# Patient Record
Sex: Female | Born: 1940 | Race: White | Hispanic: No
Health system: Southern US, Academic
[De-identification: ages and names within clinical notes are randomized; demographics above are authoritative.]

## PROBLEM LIST (undated history)

## (undated) ENCOUNTER — Telehealth

## (undated) ENCOUNTER — Encounter

## (undated) ENCOUNTER — Ambulatory Visit

## (undated) ENCOUNTER — Ambulatory Visit: Attending: Dermatology | Primary: Dermatology

## (undated) ENCOUNTER — Ambulatory Visit: Payer: MEDICARE

## (undated) DIAGNOSIS — R51 Headache: Secondary | ICD-10-CM

## (undated) DIAGNOSIS — L732 Hidradenitis suppurativa: Secondary | ICD-10-CM

## (undated) DIAGNOSIS — D649 Anemia, unspecified: Secondary | ICD-10-CM

## (undated) DIAGNOSIS — I63139 Cerebral infarction due to embolism of unspecified carotid artery: Secondary | ICD-10-CM

## (undated) DIAGNOSIS — I10 Essential (primary) hypertension: Secondary | ICD-10-CM

## (undated) DIAGNOSIS — K219 Gastro-esophageal reflux disease without esophagitis: Secondary | ICD-10-CM

## (undated) DIAGNOSIS — H3530 Unspecified macular degeneration: Secondary | ICD-10-CM

## (undated) DIAGNOSIS — E119 Type 2 diabetes mellitus without complications: Secondary | ICD-10-CM

## (undated) DIAGNOSIS — D219 Benign neoplasm of connective and other soft tissue, unspecified: Secondary | ICD-10-CM

## (undated) DIAGNOSIS — R519 Headache, unspecified: Secondary | ICD-10-CM

## (undated) DIAGNOSIS — I639 Cerebral infarction, unspecified: Secondary | ICD-10-CM

## (undated) DIAGNOSIS — K759 Inflammatory liver disease, unspecified: Secondary | ICD-10-CM

## (undated) DIAGNOSIS — F419 Anxiety disorder, unspecified: Secondary | ICD-10-CM

## (undated) HISTORY — PX: COLONOSCOPY: SHX174

## (undated) HISTORY — DX: Anemia, unspecified: D64.9

## (undated) HISTORY — DX: Cerebral infarction due to embolism of unspecified carotid artery: I63.139

## (undated) HISTORY — PX: BREAST SURGERY: SHX581

## (undated) HISTORY — DX: Cerebral infarction, unspecified: I63.9

## (undated) HISTORY — PX: ABDOMINAL HYSTERECTOMY: SHX81

## (undated) HISTORY — PX: TONSILLECTOMY: SUR1361

## (undated) HISTORY — DX: Gastro-esophageal reflux disease without esophagitis: K21.9

## (undated) HISTORY — PX: CARPAL TUNNEL RELEASE: SHX101

## (undated) HISTORY — DX: Anxiety disorder, unspecified: F41.9

## (undated) HISTORY — DX: Essential (primary) hypertension: I10

---

## 2011-08-23 ENCOUNTER — Other Ambulatory Visit: Payer: Self-pay | Admitting: Family Medicine

## 2011-08-23 DIAGNOSIS — Z1231 Encounter for screening mammogram for malignant neoplasm of breast: Secondary | ICD-10-CM

## 2011-09-07 ENCOUNTER — Ambulatory Visit: Payer: Self-pay

## 2011-09-08 ENCOUNTER — Ambulatory Visit
Admission: RE | Admit: 2011-09-08 | Discharge: 2011-09-08 | Disposition: A | Discharge status: Home / Self Care | Payer: Medicare Other | Source: Ambulatory Visit | Attending: Family Medicine | Admitting: Family Medicine

## 2011-09-08 DIAGNOSIS — Z1231 Encounter for screening mammogram for malignant neoplasm of breast: Secondary | ICD-10-CM

## 2013-03-29 ENCOUNTER — Encounter: Payer: Self-pay | Admitting: Infectious Diseases

## 2013-03-29 ENCOUNTER — Ambulatory Visit (INDEPENDENT_AMBULATORY_CARE_PROVIDER_SITE_OTHER): Payer: Medicare Other | Admitting: Infectious Diseases

## 2013-03-29 VITALS — BP 186/77 | HR 99 | Temp 98.3°F | Ht 63.5 in | Wt 154.0 lb

## 2013-03-29 DIAGNOSIS — L732 Hidradenitis suppurativa: Secondary | ICD-10-CM | POA: Insufficient documentation

## 2013-03-29 MED ORDER — CLINDAMYCIN PHOSPHATE 1 % EX GEL: Freq: Four times a day (QID) | CUTANEOUS | Status: DC

## 2013-03-29 NOTE — Addendum Note (Signed)
Addended by: Landis Gandy on: 03/29/2013 04:32 PM   Modules accepted: Orders

## 2013-03-29 NOTE — Addendum Note (Signed)
Addended by: Dolan Amen D on: 03/29/2013 04:35 PM   Modules accepted: Orders

## 2013-03-29 NOTE — Progress Notes (Signed)
   Subjective:    Patient ID: Melinda Eaton, female    DOB: Jun 23, 1940, 73 y.o.   MRN: [id number]  HPI 73 yo F with hx of GERD, elevated GLCs, she was seen by her PCP 10-25-12 for boils on her thighs. She had a previous episode in June 2014 (tx with doxy). Clear fluid was noted to come out of them.  She was treated in Sept with bactrim for 2 weeks and topical bactroban.  She returned to her PCP Jan 2015 and felt that she had new lesions. States that the "sulfa was doing something to my liver so they had to stop that".  She was given a course of keflex, which she is still taking. Has been using bactroban topically on the boils as well.  No f/c. No surrounding erythema but has had bleeding and tan d/c with lesions.  Has been exercising at a gym. Uses soft soap, antibacterial as well as dial.   Wt down 27#. occas headaches, "reads a lot" Review of Systems  Constitutional: Positive for unexpected weight change. Negative for fever, chills and appetite change.  Respiratory: Negative for shortness of breath.   Cardiovascular: Negative for chest pain.  Gastrointestinal: Negative for diarrhea and constipation.  Genitourinary: Negative for difficulty urinating.  Neurological: Positive for headaches.       Objective:   Physical Exam  Constitutional: She appears well-developed and well-nourished.  HENT:  Mouth/Throat: No oropharyngeal exudate.  Eyes: EOM are normal. Pupils are equal, round, and reactive to light.  Neck: Neck supple.  Cardiovascular: Normal rate, regular rhythm and normal heart sounds.   Pulmonary/Chest: Effort normal and breath sounds normal.  Abdominal: Soft. Bowel sounds are normal. She exhibits no distension. There is no tenderness.  Lymphadenopathy:    She has no cervical adenopathy.  Skin:             Assessment & Plan:

## 2013-03-29 NOTE — Assessment & Plan Note (Signed)
I sent her d/c from her groin wound for Cx. Her lesions seem more consistent with hidradenitis suppurativa. Will write her for clinda gel, she can continue her keflex for now. I asked her to consider being eval by surgery as this can be very helpful for these lesions. She is very reluctant to do this.  Will see her back in 2 weeks.

## 2013-04-01 LAB — WOUND CULTURE
GRAM STAIN: NONE SEEN
Gram Stain: NONE SEEN
Gram Stain: NONE SEEN
ORGANISM ID, BACTERIA: NO GROWTH

## 2013-04-11 ENCOUNTER — Ambulatory Visit (INDEPENDENT_AMBULATORY_CARE_PROVIDER_SITE_OTHER): Payer: Medicare Other | Admitting: Infectious Diseases

## 2013-04-11 ENCOUNTER — Encounter: Payer: Self-pay | Admitting: Infectious Diseases

## 2013-04-11 VITALS — BP 169/82 | HR 96 | Temp 98.0°F | Ht 63.0 in | Wt 157.0 lb

## 2013-04-11 DIAGNOSIS — L732 Hidradenitis suppurativa: Secondary | ICD-10-CM

## 2013-04-11 MED ORDER — DOXYCYCLINE HYCLATE 50 MG PO CAPS
50.0000 mg | ORAL_CAPSULE | Freq: Two times a day (BID) | ORAL | Status: DC
Start: 2013-04-11 — End: 2013-05-21

## 2013-04-11 NOTE — Assessment & Plan Note (Signed)
Will continue her clinda-gel. Will change her anbx to doxy. I pleaded with her to see surgery. She agrees. Will see her back in 1 month. A repeat wound Cx is sent.

## 2013-04-11 NOTE — Progress Notes (Signed)
73 yo F with hx of GERD, elevated GLCs, she was seen by her PCP 10-25-12 for boils on her thighs. She had a previous episode in June 2014 (tx with doxy). Clear fluid was noted to come out of them.  She was treated in Sept with bactrim for 2 weeks and topical bactroban.  She returned to her PCP Jan 2015 and felt that she had new lesions. States that the "sulfa was doing something to my liver so they had to stop that". She was given a course of keflex, which she is still taking. Has been using bactroban topically on the boils as well.  She was seen in ID clinic 2-5 and was felt to have hidradenitis suppuritiva- she was started on clindagel, continued on kelfex. She had wound Cx sent which was (-). She was offered surgical eval which she deffered.  Today states her wounds are unchanged, are draining, and she feels that she has more sores now. Has 2 more days of anbx. Has been using clinda-gel.  BP was fine at home, no headaches, no chest pain, no fevers, no chills.   BP 169/82  Pulse 96  Temp(Src) 98 F (36.7 C) (Oral)  Ht 5\' 3"  (1.6 m)  Wt 157 lb (71.215 kg)  BMI 27.82 kg/m2 NAD Groin wounds show mild d/c, mild tenderness. There is purulent d/c from L groin wound.

## 2013-04-14 LAB — WOUND CULTURE
GRAM STAIN: NONE SEEN
Gram Stain: NONE SEEN
Organism ID, Bacteria: NO GROWTH

## 2013-04-17 ENCOUNTER — Ambulatory Visit (INDEPENDENT_AMBULATORY_CARE_PROVIDER_SITE_OTHER): Payer: Medicare Other | Admitting: Surgery

## 2013-04-17 ENCOUNTER — Encounter (INDEPENDENT_AMBULATORY_CARE_PROVIDER_SITE_OTHER): Payer: Self-pay | Admitting: Surgery

## 2013-04-17 VITALS — BP 155/103 | HR 84 | Temp 98.6°F | Resp 18 | Ht 63.5 in | Wt 159.0 lb

## 2013-04-17 DIAGNOSIS — L732 Hidradenitis suppurativa: Secondary | ICD-10-CM

## 2013-04-17 NOTE — Progress Notes (Signed)
Patient ID: Melinda Eaton, female   DOB: Nov 16, 1940, 73 y.o.   MRN: [id number]  Chief Complaint  Patient presents with  . Other    hidradenitis    HPI Melinda Eaton is a 73 y.o. female.   HPI She is referred by Dr. Bobby Rumpf for evaluation of chronic hidradenitis in both groins. She reports this started approximately a year or so ago. She has had intermittent draining boils in both her groins. The left side as been worse in the right. She has no other previous history of hidradenitis in the distant past. She has no other areas draining. Past Medical History  Diagnosis Date  . GERD (gastroesophageal reflux disease)   . Hypertension     History reviewed. No pertinent past surgical history.  Family History  Problem Relation Age of Onset  . Cancer Father     prostate  . Cancer Sister     Social History History  Substance Use Topics  . Smoking status: Current Every Day Smoker -- 0.50 packs/day    Types: Cigarettes  . Smokeless tobacco: Never Used  . Alcohol Use: No    Allergies  Allergen Reactions  . Bactrim [Sulfamethoxazole-Tmp Ds] Other (See Comments)    hepatitis    Current Outpatient Prescriptions  Medication Sig Dispense Refill  . amLODipine (NORVASC) 5 MG tablet Take 5 mg by mouth daily.      . clindamycin (CLINDAGEL) 1 % gel Apply topically 4 (four) times daily.  30 g  1  . doxycycline (VIBRAMYCIN) 50 MG capsule Take 1 capsule (50 mg total) by mouth 2 (two) times daily.  60 capsule  1  . hydrochlorothiazide (MICROZIDE) 12.5 MG capsule Take 12.5 mg by mouth daily.      . mupirocin ointment (BACTROBAN) 2 % Apply 1 application topically 2 (two) times daily. 2-3 times daily on boils      . omeprazole (PRILOSEC) 20 MG capsule Take 20 mg by mouth daily.      . quinapril (ACCUPRIL) 20 MG tablet Take 20 mg by mouth at bedtime.       No current facility-administered medications for this visit.    Review of Systems Review of Systems  Constitutional: Negative  for fever, chills and unexpected weight change.  HENT: Negative for congestion, hearing loss, sore throat, trouble swallowing and voice change.   Eyes: Negative for visual disturbance.  Respiratory: Negative for cough and wheezing.   Cardiovascular: Negative for chest pain, palpitations and leg swelling.  Gastrointestinal: Negative for nausea, vomiting, abdominal pain, diarrhea, constipation, blood in stool, abdominal distention and anal bleeding.  Genitourinary: Negative for hematuria, vaginal bleeding and difficulty urinating.  Musculoskeletal: Negative for arthralgias.  Skin: Negative for rash and wound.  Neurological: Negative for seizures, syncope and headaches.  Hematological: Negative for adenopathy. Does not bruise/bleed easily.  Psychiatric/Behavioral: Negative for confusion.    Blood pressure 155/103, pulse 84, temperature 98.6 F (37 C), temperature source Temporal, resp. rate 18, height 5' 3.5" (1.613 m), weight 159 lb (72.122 kg).  Physical Exam Physical Exam  Constitutional: She is oriented to person, place, and time. She appears well-developed and well-nourished. No distress.  HENT:  Head: Normocephalic and atraumatic.  Right Ear: External ear normal.  Left Ear: External ear normal.  Nose: Nose normal.  Mouth/Throat: Oropharynx is clear and moist. No oropharyngeal exudate.  Eyes: Conjunctivae are normal. Pupils are equal, round, and reactive to light. Right eye exhibits no discharge. Left eye exhibits no discharge. No scleral icterus.  Neck: Normal  range of motion. Neck supple. No tracheal deviation present.  Cardiovascular: Normal rate, regular rhythm, normal heart sounds and intact distal pulses.   No murmur heard. Pulmonary/Chest: Effort normal and breath sounds normal. No respiratory distress. She has no wheezes. She has no rales.  Abdominal: Soft. Bowel sounds are normal. There is no tenderness. There is no guarding.  Musculoskeletal: Normal range of motion. She  exhibits no edema and no tenderness.  Lymphadenopathy:    She has no cervical adenopathy.  Neurological: She is alert and oriented to person, place, and time.  Skin: Skin is warm. She is not diaphoretic.  She has chronic draining sinus tracts in both her groins consistent with chronic hidradenitis  Psychiatric: Her behavior is normal. Judgment normal.    Data Reviewed    Assessment    Chronic hidradenitis     Plan    I discussed the diagnosis with her in detail. We discussed continued expectant management first wide excision in the groins. I believe I could widely excise these areas and get the skin back together without need for any kind of skin grafting. She again understands this is not a curative procedure but will control her chronic draining sinus tracts. I discussed the risk which includes but is not limited to bleeding, infection, wound breakdown, etc. She understands and wished to proceed with wide excision in both groins. Surgery will be scheduled        Kewon Statler A 04/17/2013, 2:40 PM

## 2013-04-26 ENCOUNTER — Encounter (HOSPITAL_COMMUNITY): Payer: Self-pay | Admitting: Pharmacy Technician

## 2013-04-27 ENCOUNTER — Encounter (HOSPITAL_COMMUNITY)
Admission: RE | Admit: 2013-04-27 | Discharge: 2013-04-27 | Disposition: A | Discharge status: Home / Self Care | Payer: Medicare Other | Source: Ambulatory Visit | Attending: Anesthesiology | Admitting: Anesthesiology

## 2013-04-27 ENCOUNTER — Encounter (HOSPITAL_COMMUNITY)
Admission: RE | Admit: 2013-04-27 | Discharge: 2013-04-27 | Disposition: A | Discharge status: Home / Self Care | Payer: Medicare Other | Source: Ambulatory Visit | Attending: Surgery | Admitting: Surgery

## 2013-04-27 ENCOUNTER — Encounter (HOSPITAL_COMMUNITY): Payer: Self-pay

## 2013-04-27 ENCOUNTER — Other Ambulatory Visit (HOSPITAL_COMMUNITY): Payer: Self-pay | Admitting: *Deleted

## 2013-04-27 DIAGNOSIS — Z01812 Encounter for preprocedural laboratory examination: Secondary | ICD-10-CM | POA: Insufficient documentation

## 2013-04-27 DIAGNOSIS — Z01810 Encounter for preprocedural cardiovascular examination: Secondary | ICD-10-CM | POA: Insufficient documentation

## 2013-04-27 DIAGNOSIS — Z01818 Encounter for other preprocedural examination: Secondary | ICD-10-CM | POA: Insufficient documentation

## 2013-04-27 HISTORY — DX: Unspecified macular degeneration: H35.30

## 2013-04-27 LAB — BASIC METABOLIC PANEL
BUN: 18 mg/dL (ref 6–23)
CALCIUM: 9.2 mg/dL (ref 8.4–10.5)
CO2: 28 meq/L (ref 19–32)
Chloride: 98 mEq/L (ref 96–112)
Creatinine, Ser: 0.66 mg/dL (ref 0.50–1.10)
GFR calc Af Amer: >90 mL/min (ref >90)
GFR, EST NON AFRICAN AMERICAN: 86 mL/min — AB (ref >90)
Glucose, Bld: 111 mg/dL — ABNORMAL HIGH (ref 70–99)
POTASSIUM: 3.6 meq/L — AB (ref 3.7–5.3)
SODIUM: 139 meq/L (ref 137–147)

## 2013-04-27 LAB — CBC
HCT: 40.3 % (ref 36.0–46.0)
HEMOGLOBIN: 13.9 g/dL (ref 12.0–15.0)
MCH: 30.2 pg (ref 26.0–34.0)
MCHC: 34.5 g/dL (ref 30.0–36.0)
MCV: 87.4 fL (ref 78.0–100.0)
Platelets: 191 10*3/uL (ref 150–400)
RBC: 4.61 MIL/uL (ref 3.87–5.11)
RDW: 13.2 % (ref 11.5–15.5)
WBC: 7.9 10*3/uL (ref 4.0–10.5)

## 2013-04-27 NOTE — Pre-Procedure Instructions (Signed)
Melinda Eaton  04/27/2013   Your procedure is scheduled on:  Monday, May 07, 2013 at 11:15 AM.   Report to [facility] Entrance "A" Admitting Office at 8:15 AM.   Call this number if you have problems the morning of surgery: [phone]   Remember:   Do not eat food or drink liquids after midnight Sunday, 05/06/13.   Take these medicines the morning of surgery with A SIP OF WATER:amLODipine (NORVASC), omeprazole (PRILOSEC)  Stop Vitamins 7 days prior to surgery.    Do not wear jewelry, make-up or nail polish.  Do not wear lotions, powders, or perfumes. You may wear deodorant.  Do not shave 48 hours prior to surgery.   Do not bring valuables to the hospital.  [facility] is not responsible                  for any belongings or valuables.               Contacts, dentures or bridgework may not be worn into surgery.  Leave suitcase in the car. After surgery it may be brought to your room.  For patients admitted to the hospital, discharge time is determined by your                treatment team.               Patients discharged the day of surgery will not be allowed to drive home.  Name and phone number of your driver: Family/friend   Special Instructions: [facility] - Preparing for Surgery  Before surgery, you can play an important role.  Because skin is not sterile, your skin needs to be as free of germs as possible.  You can reduce the number of germs on you skin by washing with CHG (chlorahexidine gluconate) soap before surgery.  CHG is an antiseptic cleaner which kills germs and bonds with the skin to continue killing germs even after washing.  Please DO NOT use if you have an allergy to CHG or antibacterial soaps.  If your skin becomes reddened/irritated stop using the CHG and inform your nurse when you arrive at Short Stay.  Do not shave (including legs and underarms) for at least 48 hours prior to the first CHG shower.  You may shave your face.  Please follow  these instructions carefully:   1.  Shower with CHG Soap the night before surgery and the                                morning of Surgery.  2.  If you choose to wash your hair, wash your hair first as usual with your       normal shampoo.  3.  After you shampoo, rinse your hair and body thoroughly to remove the                      Shampoo.  4.  Use CHG as you would any other liquid soap.  You can apply chg directly       to the skin and wash gently with scrungie or a clean washcloth.  5.  Apply the CHG Soap to your body ONLY FROM THE NECK DOWN.        Do not use on open wounds or open sores.  Avoid contact with your eyes, ears, mouth and genitals (private parts).  Wash genitals (private  parts) with your normal soap.  6.  Wash thoroughly, paying special attention to the area where your surgery        will be performed.  7.  Thoroughly rinse your body with warm water from the neck down.  8.  DO NOT shower/wash with your normal soap after using and rinsing off       the CHG Soap.  9.  Pat yourself dry with a clean towel.            10.  Wear clean pajamas.            11.  Place clean sheets on your bed the night of your first shower and do not        sleep with pets.  Day of Surgery  Do not apply any lotions the morning of surgery.  Please wear clean clothes to the hospital/surgery center.     Please read over the following fact sheets that you were given: Pain Booklet, Coughing and Deep Breathing and Surgical Site Infection Prevention

## 2013-05-02 ENCOUNTER — Telehealth: Payer: Self-pay | Admitting: *Deleted

## 2013-05-02 NOTE — Telephone Encounter (Signed)
Request for notes from [facility], referring provider. Office visit notes faxed to [phone]. Myrtis Hopping

## 2013-05-06 MED ORDER — CEFAZOLIN SODIUM-DEXTROSE 2-3 GM-% IV SOLR
2.0000 g | INTRAVENOUS | Status: AC
  Administered 2013-05-07: 2 g via INTRAVENOUS
  Filled 2013-05-06: qty 50

## 2013-05-06 NOTE — H&P (Signed)
Chief Complaint   Patient presents with   .  Other     hidradenitis   HPI  Melinda Eaton is a 73 y.o. female.  HPI  She is referred by Dr. Bobby Rumpf for evaluation of chronic hidradenitis in both groins. She reports this started approximately a year or so ago. She has had intermittent draining boils in both her groins. The left side as been worse in the right. She has no other previous history of hidradenitis in the distant past. She has no other areas draining.  Past Medical History   Diagnosis  Date   .  GERD (gastroesophageal reflux disease)    .  Hypertension    History reviewed. No pertinent past surgical history.  Family History   Problem  Relation  Age of Onset   .  Cancer  Father      prostate   .  Cancer  Sister    Social History  History   Substance Use Topics   .  Smoking status:  Current Every Day Smoker -- 0.50 packs/day     Types:  Cigarettes   .  Smokeless tobacco:  Never Used   .  Alcohol Use:  No    Allergies   Allergen  Reactions   .  Bactrim [Sulfamethoxazole-Tmp Ds]  Other (See Comments)     hepatitis    Current Outpatient Prescriptions   Medication  Sig  Dispense  Refill   .  amLODipine (NORVASC) 5 MG tablet  Take 5 mg by mouth daily.     .  clindamycin (CLINDAGEL) 1 % gel  Apply topically 4 (four) times daily.  30 g  1   .  doxycycline (VIBRAMYCIN) 50 MG capsule  Take 1 capsule (50 mg total) by mouth 2 (two) times daily.  60 capsule  1   .  hydrochlorothiazide (MICROZIDE) 12.5 MG capsule  Take 12.5 mg by mouth daily.     .  mupirocin ointment (BACTROBAN) 2 %  Apply 1 application topically 2 (two) times daily. 2-3 times daily on boils     .  omeprazole (PRILOSEC) 20 MG capsule  Take 20 mg by mouth daily.     .  quinapril (ACCUPRIL) 20 MG tablet  Take 20 mg by mouth at bedtime.      No current facility-administered medications for this visit.   Review of Systems  Review of Systems  Constitutional: Negative for fever, chills and unexpected  weight change.  HENT: Negative for congestion, hearing loss, sore throat, trouble swallowing and voice change.  Eyes: Negative for visual disturbance.  Respiratory: Negative for cough and wheezing.  Cardiovascular: Negative for chest pain, palpitations and leg swelling.  Gastrointestinal: Negative for nausea, vomiting, abdominal pain, diarrhea, constipation, blood in stool, abdominal distention and anal bleeding.  Genitourinary: Negative for hematuria, vaginal bleeding and difficulty urinating.  Musculoskeletal: Negative for arthralgias.  Skin: Negative for rash and wound.  Neurological: Negative for seizures, syncope and headaches.  Hematological: Negative for adenopathy. Does not bruise/bleed easily.  Psychiatric/Behavioral: Negative for confusion.  Blood pressure 155/103, pulse 84, temperature 98.6 F (37 C), temperature source Temporal, resp. rate 18, height 5' 3.5" (1.613 m), weight 159 lb (72.122 kg).  Physical Exam  Physical Exam  Constitutional: She is oriented to person, place, and time. She appears well-developed and well-nourished. No distress.  HENT:  Head: Normocephalic and atraumatic.  Right Ear: External ear normal.  Left Ear: External ear normal.  Nose: Nose normal.  Mouth/Throat: Oropharynx is  clear and moist. No oropharyngeal exudate.  Eyes: Conjunctivae are normal. Pupils are equal, round, and reactive to light. Right eye exhibits no discharge. Left eye exhibits no discharge. No scleral icterus.  Neck: Normal range of motion. Neck supple. No tracheal deviation present.  Cardiovascular: Normal rate, regular rhythm, normal heart sounds and intact distal pulses.  No murmur heard.  Pulmonary/Chest: Effort normal and breath sounds normal. No respiratory distress. She has no wheezes. She has no rales.  Abdominal: Soft. Bowel sounds are normal. There is no tenderness. There is no guarding.  Musculoskeletal: Normal range of motion. She exhibits no edema and no tenderness.   Lymphadenopathy:  She has no cervical adenopathy.  Neurological: She is alert and oriented to person, place, and time.  Skin: Skin is warm. She is not diaphoretic.  She has chronic draining sinus tracts in both her groins consistent with chronic hidradenitis  Psychiatric: Her behavior is normal. Judgment normal.  Data Reviewed  Assessment  Chronic hidradenitis  Plan  I discussed the diagnosis with her in detail. We discussed continued expectant management first wide excision in the groins. I believe I could widely excise these areas and get the skin back together without need for any kind of skin grafting. She again understands this is not a curative procedure but will control her chronic draining sinus tracts. I discussed the risk which includes but is not limited to bleeding, infection, wound breakdown, etc. She understands and wished to proceed with wide excision in both groins. Surgery will be scheduled

## 2013-05-07 ENCOUNTER — Encounter (HOSPITAL_COMMUNITY)
Admission: RE | Disposition: A | Discharge status: Home / Self Care | Payer: Self-pay | Source: Ambulatory Visit | Attending: Surgery

## 2013-05-07 ENCOUNTER — Encounter (HOSPITAL_COMMUNITY): Payer: Self-pay | Admitting: Anesthesiology

## 2013-05-07 ENCOUNTER — Ambulatory Visit (HOSPITAL_COMMUNITY)
Admission: RE | Admit: 2013-05-07 | Discharge: 2013-05-07 | Disposition: A | Discharge status: Home / Self Care | Payer: Medicare Other | Source: Ambulatory Visit | Attending: Surgery | Admitting: Surgery

## 2013-05-07 ENCOUNTER — Encounter (HOSPITAL_COMMUNITY): Payer: Medicare Other | Admitting: Certified Registered"

## 2013-05-07 ENCOUNTER — Ambulatory Visit (HOSPITAL_COMMUNITY): Payer: Medicare Other | Admitting: Certified Registered"

## 2013-05-07 DIAGNOSIS — I10 Essential (primary) hypertension: Secondary | ICD-10-CM | POA: Insufficient documentation

## 2013-05-07 DIAGNOSIS — F17200 Nicotine dependence, unspecified, uncomplicated: Secondary | ICD-10-CM | POA: Insufficient documentation

## 2013-05-07 DIAGNOSIS — L732 Hidradenitis suppurativa: Secondary | ICD-10-CM

## 2013-05-07 DIAGNOSIS — K219 Gastro-esophageal reflux disease without esophagitis: Secondary | ICD-10-CM | POA: Insufficient documentation

## 2013-05-07 HISTORY — PX: HYDRADENITIS EXCISION: SHX5243

## 2013-05-07 SURGERY — EXCISION, HIDRADENITIS, INGUINAL REGION
Anesthesia: General | Site: Groin | Laterality: Bilateral

## 2013-05-07 MED ORDER — ONDANSETRON HCL 4 MG/2ML IJ SOLN
INTRAMUSCULAR | Status: DC | PRN
  Administered 2013-05-07: 4 mg via INTRAVENOUS

## 2013-05-07 MED ORDER — PROPOFOL 10 MG/ML IV BOLUS
INTRAVENOUS | Status: DC | PRN
  Administered 2013-05-07: 120 mg via INTRAVENOUS

## 2013-05-07 MED ORDER — KETOROLAC TROMETHAMINE 15 MG/ML IJ SOLN
INTRAMUSCULAR | Status: DC | PRN
  Administered 2013-05-07: 15 mg via INTRAVENOUS

## 2013-05-07 MED ORDER — 0.9 % SODIUM CHLORIDE (POUR BTL) OPTIME
TOPICAL | Status: DC | PRN
  Administered 2013-05-07: 1000 mL

## 2013-05-07 MED ORDER — CLINDAMYCIN HCL 150 MG PO CAPS: 150.0000 mg | ORAL_CAPSULE | Freq: Three times a day (TID) | ORAL | Status: DC

## 2013-05-07 MED ORDER — MIDAZOLAM HCL 5 MG/5ML IJ SOLN
INTRAMUSCULAR | Status: DC | PRN
Start: 2013-05-07 — End: 2013-05-07
  Administered 2013-05-07: 1 mg via INTRAVENOUS

## 2013-05-07 MED ORDER — BUPIVACAINE-EPINEPHRINE (PF) 0.5% -1:200000 IJ SOLN
INTRAMUSCULAR | Status: AC
  Filled 2013-05-07: qty 10

## 2013-05-07 MED ORDER — KETOROLAC TROMETHAMINE 30 MG/ML IJ SOLN
INTRAMUSCULAR | Status: AC
  Filled 2013-05-07: qty 1

## 2013-05-07 MED ORDER — LIDOCAINE HCL (CARDIAC) 20 MG/ML IV SOLN
INTRAVENOUS | Status: AC
  Filled 2013-05-07: qty 5

## 2013-05-07 MED ORDER — ONDANSETRON HCL 4 MG/2ML IJ SOLN: 4.0000 mg | Freq: Once | INTRAMUSCULAR | Status: DC | PRN

## 2013-05-07 MED ORDER — FENTANYL CITRATE 0.05 MG/ML IJ SOLN
INTRAMUSCULAR | Status: DC | PRN
  Administered 2013-05-07 (×4): 50 ug via INTRAVENOUS

## 2013-05-07 MED ORDER — ONDANSETRON HCL 4 MG/2ML IJ SOLN
INTRAMUSCULAR | Status: AC
  Filled 2013-05-07: qty 2

## 2013-05-07 MED ORDER — MIDAZOLAM HCL 2 MG/2ML IJ SOLN
INTRAMUSCULAR | Status: AC
  Filled 2013-05-07: qty 2

## 2013-05-07 MED ORDER — PROPOFOL 10 MG/ML IV BOLUS
INTRAVENOUS | Status: AC
  Filled 2013-05-07: qty 20

## 2013-05-07 MED ORDER — LIDOCAINE HCL (CARDIAC) 20 MG/ML IV SOLN
INTRAVENOUS | Status: DC | PRN
  Administered 2013-05-07: 60 mg via INTRAVENOUS

## 2013-05-07 MED ORDER — HYDROCODONE-ACETAMINOPHEN 5-325 MG PO TABS: 1.0000 | ORAL_TABLET | Freq: Four times a day (QID) | ORAL | Status: DC | PRN

## 2013-05-07 MED ORDER — PHENYLEPHRINE 40 MCG/ML (10ML) SYRINGE FOR IV PUSH (FOR BLOOD PRESSURE SUPPORT)
PREFILLED_SYRINGE | INTRAVENOUS | Status: AC
  Filled 2013-05-07: qty 10

## 2013-05-07 MED ORDER — HYDROMORPHONE HCL PF 1 MG/ML IJ SOLN: 0.2500 mg | INTRAMUSCULAR | Status: DC | PRN

## 2013-05-07 MED ORDER — FENTANYL CITRATE 0.05 MG/ML IJ SOLN
INTRAMUSCULAR | Status: AC
  Filled 2013-05-07: qty 5

## 2013-05-07 MED ORDER — LACTATED RINGERS IV SOLN
INTRAVENOUS | Status: DC
  Administered 2013-05-07 (×3): via INTRAVENOUS

## 2013-05-07 MED ORDER — BUPIVACAINE-EPINEPHRINE 0.5% -1:200000 IJ SOLN
INTRAMUSCULAR | Status: DC | PRN
  Administered 2013-05-07: 30 mL

## 2013-05-07 MED ORDER — PHENYLEPHRINE HCL 10 MG/ML IJ SOLN
INTRAMUSCULAR | Status: DC | PRN
  Administered 2013-05-07: 80 ug via INTRAVENOUS

## 2013-05-07 SURGICAL SUPPLY — 54 items
BENZOIN TINCTURE PRP APPL 2/3 (GAUZE/BANDAGES/DRESSINGS) ×3 IMPLANT
BLADE SURG 10 STRL SS (BLADE) ×3 IMPLANT
BLADE SURG 15 STRL LF DISP TIS (BLADE) ×1 IMPLANT
BLADE SURG 15 STRL SS (BLADE) ×2
BLADE SURG ROTATE 9660 (MISCELLANEOUS) IMPLANT
CANISTER SUCTION 2500CC (MISCELLANEOUS) ×3 IMPLANT
CHLORAPREP W/TINT 26ML (MISCELLANEOUS) ×3 IMPLANT
CLOSURE WOUND 1/2 X4 (GAUZE/BANDAGES/DRESSINGS) ×1
COVER SURGICAL LIGHT HANDLE (MISCELLANEOUS) ×3 IMPLANT
DRAPE LAPAROTOMY TRNSV 102X78 (DRAPE) ×3 IMPLANT
DRAPE UTILITY 15X26 W/TAPE STR (DRAPE) ×6 IMPLANT
ELECT CAUTERY BLADE 6.4 (BLADE) ×3 IMPLANT
ELECT REM PT RETURN 9FT ADLT (ELECTROSURGICAL) ×3
ELECTRODE REM PT RTRN 9FT ADLT (ELECTROSURGICAL) ×1 IMPLANT
GAUZE XEROFORM 5X9 LF (GAUZE/BANDAGES/DRESSINGS) ×3 IMPLANT
GLOVE BIOGEL PI IND STRL 6.5 (GLOVE) ×1 IMPLANT
GLOVE BIOGEL PI INDICATOR 6.5 (GLOVE) ×2
GLOVE SURG SIGNA 7.5 PF LTX (GLOVE) ×3 IMPLANT
GLOVE SURG SS PI 6.5 STRL IVOR (GLOVE) ×3 IMPLANT
GOWN STRL REUS W/ TWL LRG LVL3 (GOWN DISPOSABLE) ×1 IMPLANT
GOWN STRL REUS W/ TWL XL LVL3 (GOWN DISPOSABLE) ×1 IMPLANT
GOWN STRL REUS W/TWL LRG LVL3 (GOWN DISPOSABLE) ×2
GOWN STRL REUS W/TWL XL LVL3 (GOWN DISPOSABLE) ×2
KIT BASIN OR (CUSTOM PROCEDURE TRAY) ×3 IMPLANT
KIT ROOM TURNOVER OR (KITS) ×3 IMPLANT
MATRIX SURGICAL PSM 10X15CM (Tissue) ×3 IMPLANT
MICROMATRIX 1000MG (Tissue) ×3 IMPLANT
NEEDLE HYPO 25GX1X1/2 BEV (NEEDLE) ×3 IMPLANT
NS IRRIG 1000ML POUR BTL (IV SOLUTION) ×3 IMPLANT
PACK SURGICAL SETUP 50X90 (CUSTOM PROCEDURE TRAY) ×3 IMPLANT
PAD ABD 8X10 STRL (GAUZE/BANDAGES/DRESSINGS) ×6 IMPLANT
PAD ARMBOARD 7.5X6 YLW CONV (MISCELLANEOUS) ×6 IMPLANT
PENCIL BUTTON HOLSTER BLD 10FT (ELECTRODE) ×3 IMPLANT
SOLUTION PARTIC MCRMTRX 1000MG (Tissue) ×1 IMPLANT
SPECIMEN JAR SMALL (MISCELLANEOUS) IMPLANT
SPONGE GAUZE 4X4 12PLY (GAUZE/BANDAGES/DRESSINGS) ×6 IMPLANT
SPONGE LAP 18X18 X RAY DECT (DISPOSABLE) ×3 IMPLANT
STRIP CLOSURE SKIN 1/2X4 (GAUZE/BANDAGES/DRESSINGS) ×2 IMPLANT
SUT ETHILON 2 0 FS 18 (SUTURE) ×24 IMPLANT
SUT ETHILON 3 0 FSL (SUTURE) IMPLANT
SUT MON AB 4-0 PC3 18 (SUTURE) ×3 IMPLANT
SUT VIC AB 2-0 CT1 27 (SUTURE) ×4
SUT VIC AB 2-0 CT1 TAPERPNT 27 (SUTURE) ×2 IMPLANT
SUT VIC AB 3-0 CT1 27 (SUTURE) ×2
SUT VIC AB 3-0 CT1 TAPERPNT 27 (SUTURE) ×1 IMPLANT
SWAB CULTURE LIQUID MINI MALE (MISCELLANEOUS) ×3 IMPLANT
SYR BULB IRRIGATION 50ML (SYRINGE) ×3 IMPLANT
SYR CONTROL 10ML LL (SYRINGE) ×3 IMPLANT
TOWEL OR 17X24 6PK STRL BLUE (TOWEL DISPOSABLE) ×3 IMPLANT
TOWEL OR 17X26 10 PK STRL BLUE (TOWEL DISPOSABLE) ×3 IMPLANT
TUBE ANAEROBIC SPECIMEN COL (MISCELLANEOUS) ×3 IMPLANT
TUBE CONNECTING 12'X1/4 (SUCTIONS) ×1
TUBE CONNECTING 12X1/4 (SUCTIONS) ×2 IMPLANT
YANKAUER SUCT BULB TIP NO VENT (SUCTIONS) ×3 IMPLANT

## 2013-05-07 NOTE — Progress Notes (Signed)
Dressing reinforced and changed before discharge,  Patient understood reason for changing it

## 2013-05-07 NOTE — Anesthesia Preprocedure Evaluation (Signed)
Anesthesia Evaluation  Patient identified by MRN, date of birth, ID band Patient awake    Reviewed: Allergy & Precautions, H&P , NPO status , Patient's Chart, lab work & pertinent test results  Airway       Dental   Pulmonary Current Smoker,          Cardiovascular hypertension,     Neuro/Psych    GI/Hepatic GERD-  ,  Endo/Other    Renal/GU      Musculoskeletal   Abdominal   Peds  Hematology   Anesthesia Other Findings   Reproductive/Obstetrics                           Anesthesia Physical Anesthesia Plan  ASA: II  Anesthesia Plan: General   Post-op Pain Management:    Induction: Intravenous  Airway Management Planned: Oral ETT  Additional Equipment:   Intra-op Plan:   Post-operative Plan: Extubation in OR  Informed Consent: I have reviewed the patients History and Physical, chart, labs and discussed the procedure including the risks, benefits and alternatives for the proposed anesthesia with the patient or authorized representative who has indicated his/her understanding and acceptance.     Plan Discussed with:   Anesthesia Plan Comments:         Anesthesia Quick Evaluation

## 2013-05-07 NOTE — Op Note (Signed)
[name], [name]             ACCOUNT NO.:  [id number]  MEDICAL RECORD NO.:  [id number]  LOCATION:  [facility]                         FACILITY:  [facility]  PHYSICIAN:  Coralie Keens, M.D. DATE OF BIRTH:  Dec 09, 1940  DATE OF PROCEDURE:  05/07/2013 DATE OF DISCHARGE:  05/07/2013                              OPERATIVE REPORT   PREOPERATIVE DIAGNOSIS:  Bilateral groin hidradenitis.  POSTOPERATIVE DIAGNOSIS:  Bilateral groin hidradenitis.  PROCEDURE: 1. A 60-cm wide excision of hidradenitis of the left groin. 2. A 10-cm wide excision of hidradenitis in the right groin. 3. Placement of Xenograft in bilateral groins.  SURGEON:  Coralie Keens, MD  ANESTHESIA:  General and 0.5% Marcaine.  ESTIMATED BLOOD LOSS:  Minimal.  INDICATIONS:  This is a 73 year old female with what appears to be chronic hydradenitis in both her groins.  These were large areas and need wide excision.  Decision was made to proceed to the operating room.  PROCEDURE IN DETAIL:  The patient was brought to the operating room and identified as Melinda Eaton.  She was placed supine on the operating table and general anesthesia was induced.  She was in frogleg.  Her groins were then prepped and draped in usual sterile fashion.  She had quite extensive hidradenitis in the left inguinal area.  I had to make 2 incisions which I joined which was a total of 18 cm in size.  I took this down to the subcutaneous tissue with electrocautery and performed a wide excision of a large area of skin and subcutaneous tissue containing chronic draining sinus tracts.  I then irrigated the wound with saline. Hemostasis was achieved with cautery.  I placed the Xenograft powder into the wound and covered it completely.  I then placed a sheet of A- cell Xenograft onto the wound.  I cut a slit into the Xenograft.  I then closed the incision in a wide fashion with interrupted 2-0 nylon sutures.  Good,  tension-free closure appeared to be  achieved.  I then anesthetized the right groin with Marcaine.  I performed an 8-cm wide excision of the skin in this area containing all the chronic draining sinus tracts.  I took this down to subcutaneous tissue with electrocautery and again excised all the chronic skin and draining sinus tissue with electrocautery.  I again irrigated the wound with saline and achieved hemostasis with cautery.  I then placed the Xenograft powder and then a Xenograft sheet in the left groin.  I then closed the wound with interrupted 2-0 nylon sutures.  Gauze and mesh panties were then applied.  The patient tolerated the procedure well.  All counts were correct at the end of procedure.  The patient was then extubated in the operating room and taken in stable condition to the recovery room.     Coralie Keens, M.D.     DB/MEDQ  D:  05/07/2013  T:  05/07/2013  Job:  [id number]

## 2013-05-07 NOTE — Op Note (Signed)
WIDE EXCISION HIDRADENITIS BILATERAL GROINS, PLACEMENT OF XENO GRAFT  Procedure Note  Melinda Eaton 05/07/2013   Pre-op Diagnosis: hidradenitis bilateral groins     Post-op Diagnosis: same  Procedure(s): WIDE EXCISION HIDRADENITIS BILATERAL GROINS, PLACEMENT OF XENO GRAFT  Surgeon(s): Harl Bowie, MD  Anesthesia: General  Staff:  Circulator: Denna Haggard, RN Relief Circulator: Kara Mead, RN Scrub Person: Carlisle Beers, CST  Estimated Blood Loss: Minimal               Specimens: sent to path          [facility] A   Date: 05/07/2013  Time: 12:11 PM

## 2013-05-07 NOTE — Anesthesia Postprocedure Evaluation (Signed)
  Anesthesia Post-op Note  Patient: Melinda Eaton  Procedure(s) Performed: Procedure(s): WIDE EXCISION HIDRADENITIS BILATERAL GROINS, PLACEMENT OF XENO GRAFT (Bilateral)  Patient Location: PACU  Anesthesia Type:General  Level of Consciousness: awake, oriented, sedated and patient cooperative  Airway and Oxygen Therapy: Patient Spontanous Breathing  Post-op Pain: mild  Post-op Assessment: Post-op Vital signs reviewed, Patient's Cardiovascular Status Stable, Respiratory Function Stable, Patent Airway, No signs of Nausea or vomiting and Pain level controlled  Post-op Vital Signs: stable  Complications: No apparent anesthesia complications

## 2013-05-07 NOTE — Interval H&P Note (Signed)
History and Physical Interval Note: no change in H and P  05/07/2013 9:09 AM  Melinda Eaton  has presented today for surgery, with the diagnosis of hidradenitis bilateral groins  The various methods of treatment have been discussed with the patient and family. After consideration of risks, benefits and other options for treatment, the patient has consented to  Procedure(s): WIDE EXCISION HIDRADENITIS BILATERAL GROINS (Bilateral) as a surgical intervention .  [name] history has been reviewed, patient examined, no change in status, stable for surgery.  I have reviewed the patient's chart and labs.  Questions were answered to the patient's satisfaction.     [name]

## 2013-05-07 NOTE — Discharge Instructions (Signed)
Ok to remove bandages and shower starting tomorrow  Cover with dry dressings daily  Expect a lot of drainage  Ice pack and ibuprofen or Aleve as needed also for pain  What to eat:  For your first meals, you should eat lightly; only small meals initially.  If you do not have nausea, you may eat larger meals.  Avoid spicy, greasy and heavy food.    General Anesthesia, Adult, Care After  Refer to this sheet in the next few weeks. These instructions provide you with information on caring for yourself after your procedure. Your health care provider may also give you more specific instructions. Your treatment has been planned according to current medical practices, but problems sometimes occur. Call your health care provider if you have any problems or questions after your procedure.  WHAT TO EXPECT AFTER THE PROCEDURE  After the procedure, it is typical to experience:  Sleepiness.  Nausea and vomiting. HOME CARE INSTRUCTIONS  For the first 24 hours after general anesthesia:  Have a responsible person with you.  Do not drive a car. If you are alone, do not take public transportation.  Do not drink alcohol.  Do not take medicine that has not been prescribed by your health care provider.  Do not sign important papers or make important decisions.  You may resume a normal diet and activities as directed by your health care provider.  Change bandages (dressings) as directed.  If you have questions or problems that seem related to general anesthesia, call the hospital and ask for the anesthetist or anesthesiologist on call. SEEK MEDICAL CARE IF:  You have nausea and vomiting that continue the day after anesthesia.  You develop a rash. SEEK IMMEDIATE MEDICAL CARE IF:  You have difficulty breathing.  You have chest pain.  You have any allergic problems. Document Released: 05/17/2000 Document Revised: 10/11/2012 Document Reviewed: 08/24/2012  [facility] Patient Information 2014 [place], [place].

## 2013-05-07 NOTE — Transfer of Care (Signed)
Immediate Anesthesia Transfer of Care Note  Patient: Melinda Eaton  Procedure(s) Performed: Procedure(s): WIDE EXCISION HIDRADENITIS BILATERAL GROINS, PLACEMENT OF XENO GRAFT (Bilateral)  Patient Location: PACU  Anesthesia Type:General  Level of Consciousness: awake, alert , oriented and patient cooperative  Airway & Oxygen Therapy: Patient Spontanous Breathing  Post-op Assessment: Report given to PACU RN, Post -op Vital signs reviewed and stable and Patient moving all extremities  Post vital signs: Reviewed and stable  Complications: No apparent anesthesia complications

## 2013-05-08 ENCOUNTER — Encounter (HOSPITAL_COMMUNITY): Payer: Self-pay | Admitting: Surgery

## 2013-05-08 ENCOUNTER — Telehealth (INDEPENDENT_AMBULATORY_CARE_PROVIDER_SITE_OTHER): Payer: Self-pay

## 2013-05-08 NOTE — Telephone Encounter (Signed)
Close encounter 

## 2013-05-08 NOTE — Telephone Encounter (Signed)
Pt calling requesting to speak to Dr. Trevor Mace nurse, Deneise Lever.  Message forwarded.

## 2013-05-09 ENCOUNTER — Ambulatory Visit: Payer: Medicare Other | Admitting: Infectious Diseases

## 2013-05-14 ENCOUNTER — Encounter (INDEPENDENT_AMBULATORY_CARE_PROVIDER_SITE_OTHER): Payer: Self-pay | Admitting: Surgery

## 2013-05-14 ENCOUNTER — Ambulatory Visit (INDEPENDENT_AMBULATORY_CARE_PROVIDER_SITE_OTHER): Payer: Self-pay | Admitting: Surgery

## 2013-05-14 VITALS — BP 182/88 | HR 84 | Temp 98.7°F | Resp 16 | Ht 63.5 in | Wt 161.4 lb

## 2013-05-14 DIAGNOSIS — Z09 Encounter for follow-up examination after completed treatment for conditions other than malignant neoplasm: Secondary | ICD-10-CM

## 2013-05-14 NOTE — Progress Notes (Signed)
Subjective:     Patient ID: Melinda Eaton, female   DOB: [birth year], 73 y.o.   MRN: [id number]  HPI She is here for first postop visit status post wide excision of hidradenitis in both her groins. She reports mild to moderate drainage. She has not been on any pain medication per her report.  Review of Systems     Objective:   Physical Exam On exam, she is well appearance. The right groin area is healing well. The left groin has had an area of breakdown with exposed granulation tissue.  I applied a-cell xenograft powder And sheet into the wound and placed Gauze over this     Assessment:     Patient stable postop     Plan:     She will continue the current wound care and I will see her back next week

## 2013-05-21 ENCOUNTER — Encounter (INDEPENDENT_AMBULATORY_CARE_PROVIDER_SITE_OTHER): Payer: Medicare Other | Admitting: Surgery

## 2013-05-21 ENCOUNTER — Ambulatory Visit (INDEPENDENT_AMBULATORY_CARE_PROVIDER_SITE_OTHER): Payer: Medicare Other | Admitting: Surgery

## 2013-05-21 VITALS — BP 170/80 | HR 77 | Temp 98.5°F | Resp 16 | Ht 63.0 in | Wt 158.2 lb

## 2013-05-21 DIAGNOSIS — Z09 Encounter for follow-up examination after completed treatment for conditions other than malignant neoplasm: Secondary | ICD-10-CM

## 2013-05-21 NOTE — Progress Notes (Signed)
Subjective:     Patient ID: Melinda Eaton, female   DOB: December 23, 1940, 73 y.o.   MRN: [id number]  HPI She is here for another postoperative visit. She has been having significant drainage from the wounds.  Review of Systems     Objective:   Physical Exam On exam, she has a rash in the groin both sides from drainage from the wounds. She has had breakdown of both resection areas and I removed all her sutures. There is no purulence or underlying infection. I applied a-cell xenograft powder and sheets to both wounds    Assessment:     Patient status post wide excision of hidradenitis in the groins     Plan:     She will start placing baby powder to the areas of rash. She will change her bandages daily. I will see her back in 2 weeks

## 2013-06-05 ENCOUNTER — Ambulatory Visit (INDEPENDENT_AMBULATORY_CARE_PROVIDER_SITE_OTHER): Payer: Medicare Other | Admitting: Surgery

## 2013-06-05 ENCOUNTER — Encounter (INDEPENDENT_AMBULATORY_CARE_PROVIDER_SITE_OTHER): Payer: Self-pay | Admitting: Surgery

## 2013-06-05 VITALS — BP 168/80 | HR 78 | Temp 97.8°F | Resp 14 | Wt 159.8 lb

## 2013-06-05 DIAGNOSIS — Z09 Encounter for follow-up examination after completed treatment for conditions other than malignant neoplasm: Secondary | ICD-10-CM

## 2013-06-05 NOTE — Progress Notes (Signed)
Subjective:     Patient ID: Melinda Eaton, female   DOB: 1940/11/04, 73 y.o.   MRN: [id number]  HPI She is here for another postoperative visit. She has no complaints. Her drainage has improved significantly after using baby powder  Review of Systems     Objective:   Physical Exam On exam, there has been significant improvement in both the groin wounds. I replaced xenograft to both wounds Again     Assessment:     Patient stable postop     Plan:     She is now showing significant healing in both groins from the hidradenitis surgery. She will continue the current wound care and I will see her back in 2 weeks

## 2013-06-15 ENCOUNTER — Encounter (INDEPENDENT_AMBULATORY_CARE_PROVIDER_SITE_OTHER): Payer: Self-pay | Admitting: Surgery

## 2013-06-15 ENCOUNTER — Telehealth (INDEPENDENT_AMBULATORY_CARE_PROVIDER_SITE_OTHER): Payer: Self-pay

## 2013-06-15 ENCOUNTER — Ambulatory Visit (INDEPENDENT_AMBULATORY_CARE_PROVIDER_SITE_OTHER): Payer: Medicare Other | Admitting: Surgery

## 2013-06-15 VITALS — BP 212/90 | HR 90 | Temp 97.8°F | Resp 14 | Wt 161.0 lb

## 2013-06-15 DIAGNOSIS — L732 Hidradenitis suppurativa: Secondary | ICD-10-CM

## 2013-06-15 MED ORDER — CLINDAMYCIN HCL 150 MG PO CAPS: 150.0000 mg | ORAL_CAPSULE | Freq: Three times a day (TID) | ORAL | Status: DC

## 2013-06-15 NOTE — Patient Instructions (Addendum)
WOUND CARE  It is important that the wound be kept open.   -Keeping the skin edges apart will allow the wound to gradually heal from the base upwards.   - If the skin edges of the wound close too early, a new fluid pocket can form and infection can occur. -This is the reason to pack deeper wounds with gauze or ribbon -This is why drained wounds cannot be sewed closed right away  A healthy wound should form a lining of bright red "beefy" granulating tissue that will help shrink the wound and help the edges grow new skin into it.   -A little mucus / yellow discharge is normal (the body's natural way to try and form a scab) and should be gently washed off with soap and water with daily dressing changes.  -Green or foul smelling drainage implies bacterial colonization and can slow wound healing - a short course of antibiotic ointment (3-5 days) can help it clear up.  Call the doctor if it does not improve or worsens  -Avoid use of antibiotic ointments for more than a week as they can slow wound healing over time.    -Sometimes other wound care products will be used to reduce need for dressing changes and/or help clean up dirty wounds -Sometimes the surgeon needs to debride the wound in the office to remove dead or infected tissue out of the wound so it can heal more quickly and safely.    Change the dressing at least once a day -Wash the wound with mild soap and water gently every day.  It is good to shower or bathe the wound to help it clean out. -Use clean 4x4 gauze for medium/large wounds or ribbon plain NU-gauze for smaller wounds (it does not need to be sterile, just clean) -Keep the raw wound moist with a little saline or KY (saline) gel on the gauze.  -A dry wound will take longer to heal.  -Keep the skin dry around the wound to prevent breakdown and irritation. -Pack the wound down to the base -The goal is to keep the skin apart, not overpack the wound -Use a Q-tip or blunt-tipped kabob  stick toothpick to push the gauze down to the base in narrow or deep wounds   -Cover with a clean gauze and tape -paper or Medipore tape tend to be gentle on the skin -rotate the orientation of the tape to avoid repeated stress/trauma on the skin -using an ACE or Coban wrap on wounds on arms or legs can be used instead.  Complete all antibiotics through the entire prescription to help the infection heal and prevent new places of infection   Returning the see the surgeon is helpful to follow the healing process and help the wound close as fast as possible.  Hidradenitis Suppurativa, Sweat Gland Abscess Hidradenitis suppurativa is a long lasting (chronic), uncommon disease of the sweat glands. With this, boil-like lumps and scarring develop in the groin, some times under the arms (axillae), and under the breasts. It may also uncommonly occur behind the ears, in the crease of the buttocks, and around the genitals.  CAUSES  The cause is from a blocking of the sweat glands. They then become infected. It may cause drainage and odor. It is not contagious. So it cannot be given to someone else. It most often shows up in puberty (about 17 to 73 years of age). But it may happen much later. It is similar to acne which is a disease  of the sweat glands. This condition is slightly more common in African-Americans and women. SYMPTOMS   Hidradenitis usually starts as one or more red, tender, swellings in the groin or under the arms (axilla).  Over a period of hours to days the lesions get larger. They often open to the skin surface, draining clear to yellow-colored fluid.  The infected area heals with scarring. DIAGNOSIS  Your caregiver makes this diagnosis by looking at you. Sometimes cultures (growing germs on plates in the lab) may be taken. This is to see what germ (bacterium) is causing the infection.  TREATMENT   Topical germ killing medicine applied to the skin (antibiotics) are the treatment of  choice. Antibiotics taken by mouth (systemic) are sometimes needed when the condition is getting worse or is severe.  Avoid tight-fitting clothing which traps moisture in.  Dirt does not cause hidradenitis and it is not caused by poor hygiene.  Involved areas should be cleaned daily using an antibacterial soap. Some patients find that the liquid form of Lever 2000, applied to the involved areas as a lotion after bathing, can help reduce the odor related to this condition.  Sometimes surgery is needed to drain infected areas or remove scarred tissue. Removal of large amounts of tissue is used only in severe cases.  Birth control pills may be helpful.  Oral retinoids (vitamin A derivatives) for 6 to 12 months which are effective for acne may also help this condition.  Weight loss will improve but not cure hidradenitis. It is made worse by being overweight. But the condition is not caused by being overweight.  This condition is more common in people who have had acne.  It may become worse under stress. There is no medical cure for hidradenitis. It can be controlled, but not cured. The condition usually continues for years with periods of getting worse and getting better (remission). Document Released: 09/23/2003 Document Revised: 05/03/2011 Document Reviewed: 10/09/2007 [facility] Patient Information 2014 [place].

## 2013-06-15 NOTE — Telephone Encounter (Signed)
Patient states she has swelling and some some bloody discharge  in the wound  groin area, she feels it is becoming infected. Denies temp,odor. She has not been doing sitz baths , she has been showering. Appt. In [place] today with Dr. Johney [place] 06-15-13@315 

## 2013-06-15 NOTE — Progress Notes (Signed)
Subjective:     Patient ID: Melinda Eaton, female   DOB: [birth year], 73 y.o.   MRN: [id number]  HPI  Note: This dictation was prepared with Dragon/digital dictation along with [facility] technology. Any transcriptional errors that result from this process are unintentional.       CALLI BASHOR  [birth year] [id number]  Patient Care Team: Christain Sacramento, MD as PCP - General (Family Medicine)  Procedure Date: 05/07/2013  Pre-op Diagnosis: hidradenitis bilateral groins  Post-op Diagnosis: same  Procedure(s):  WIDE EXCISION HIDRADENITIS BILATERAL GROINS, PLACEMENT OF XENO GRAFT  Surgeon(s):  Harl Bowie, MD  This patient returns for surgical re-evaluation.  She had excision hydradenitis in thigh folds of groins.  She has been using ABD pads.  Has to change them many times a day.  She noted some increased drainage.  She was concerned.  The uterine urgently.  She is worried she has an abscess on the left groin.  Not on antibiotics.  Patient Active Problem List   Diagnosis Date Noted  . Hidradenitis suppurativa 03/29/2013    Past Medical History  Diagnosis Date  . GERD (gastroesophageal reflux disease)   . Hypertension   . Macular degeneration     Past Surgical History  Procedure Laterality Date  . Abdominal hysterectomy    . Hydradenitis excision Bilateral 05/07/2013    Procedure: WIDE EXCISION HIDRADENITIS BILATERAL GROINS, PLACEMENT OF XENO GRAFT;  Surgeon: Harl Bowie, MD;  Location: [place];  Service: General;  Laterality: Bilateral;    History   Social History  . Marital Status: Divorced    Spouse Name: N/A    Number of Children: N/A  . Years of Education: N/A   Occupational History  . Not on file.   Social History Main Topics  . Smoking status: Current Every Day Smoker -- 0.50 packs/day for 50 years    Types: Cigarettes  . Smokeless tobacco: Never Used  . Alcohol Use: No  . Drug Use: No  . Sexual Activity: Not on file   Other Topics  Concern  . Not on file   Social History Narrative  . No narrative on file    Family History  Problem Relation Age of Onset  . Cancer Father     prostate  . Cancer Sister     Current Outpatient Prescriptions  Medication Sig Dispense Refill  . amLODipine (NORVASC) 5 MG tablet Take 5 mg by mouth daily.      . clindamycin (CLEOCIN) 150 MG capsule Take 1 capsule (150 mg total) by mouth 3 (three) times daily.  21 capsule  1  . clindamycin (CLINDAGEL) 1 % gel Apply 1 application topically 4 (four) times daily.      . hydrochlorothiazide (MICROZIDE) 12.5 MG capsule Take 12.5 mg by mouth daily.      . Multiple Vitamins-Minerals (PRESERVISION/LUTEIN) CAPS Take 1 capsule by mouth daily.      Marland Kitchen omeprazole (PRILOSEC) 20 MG capsule Take 20 mg by mouth daily.      . quinapril (ACCUPRIL) 20 MG tablet Take 20 mg by mouth at bedtime.       No current facility-administered medications for this visit.     Allergies  Allergen Reactions  . Bactrim [Sulfamethoxazole-Tmp Ds] Other (See Comments)    hepatitis  . Doxycycline     Headache     BP 212/90  Pulse 90  Temp(Src) 97.8 F (36.6 C) (Temporal)  Resp 14  Wt 161 lb (73.029 kg)  No results found.  Review of Systems  Constitutional: Negative for fever, chills and diaphoresis.  HENT: Negative for ear pain, sore throat and trouble swallowing.   Eyes: Negative for photophobia and visual disturbance.  Respiratory: Negative for cough and choking.   Cardiovascular: Negative for chest pain and palpitations.  Gastrointestinal: Negative for nausea, vomiting, abdominal pain, diarrhea, constipation, anal bleeding and rectal pain.  Genitourinary: Negative for dysuria, frequency and difficulty urinating.  Musculoskeletal: Negative for gait problem and myalgias.  Skin: Positive for wound. Negative for color change, pallor and rash.  Neurological: Negative for dizziness, speech difficulty, weakness and numbness.  Hematological: Negative for  adenopathy.  Psychiatric/Behavioral: Negative for confusion and agitation. The patient is not nervous/anxious.        Objective:   Physical Exam  Constitutional: She is oriented to person, place, and time. She appears well-developed and well-nourished. No distress.  HENT:  Head: Normocephalic.  Mouth/Throat: Oropharynx is clear and moist. No oropharyngeal exudate.  Eyes: Conjunctivae and EOM are normal. Pupils are equal, round, and reactive to light. No scleral icterus.  Neck: Normal range of motion. No tracheal deviation present.  Cardiovascular: Normal rate and intact distal pulses.   Pulmonary/Chest: Effort normal. No respiratory distress. She exhibits no tenderness.  Abdominal: Soft. She exhibits no distension. There is no tenderness. Hernia confirmed negative in the right inguinal area and confirmed negative in the left inguinal area.  Incisions clean with normal healing ridges.  No hernias  Genitourinary:    No vaginal discharge found.  Musculoskeletal: Normal range of motion. She exhibits no tenderness.  Lymphadenopathy:       Right: No inguinal adenopathy present.       Left: No inguinal adenopathy present.  Neurological: She is alert and oriented to person, place, and time. No cranial nerve deficit. She exhibits normal muscle tone. Coordination normal.  Skin: Skin is warm and dry. No rash noted. She is not diaphoretic.  Psychiatric: She has a normal mood and affect. Her behavior is normal.       Assessment:     Well granulating wounds status post excision of hydradenitis suppurativa in lower groin/inner thighs.  New 2 cm area of purulence right mons pubis     Plan:     Continue wound care.  Okay to shower and bathe.  Do twice a day.  Avoid aggressive soaps or antibiotic creams.  She wondered if she could use Epson salts.  I was hesitant to recommend any salts with open wounds.  Avoid peroxide or anti-bacterial ointment.  Okay to use clean gauze.  It does not have to be  sterile, just clean.  Change at least twice a day.  I offered incision and drainage of the small abscess area.  She wanted to hold off.  We will restart clindamycin 3 times a day.  She is due to see Dr. Ninfa Linden in 4 days.

## 2013-06-19 ENCOUNTER — Encounter (INDEPENDENT_AMBULATORY_CARE_PROVIDER_SITE_OTHER): Payer: Self-pay | Admitting: Surgery

## 2013-06-19 ENCOUNTER — Ambulatory Visit (INDEPENDENT_AMBULATORY_CARE_PROVIDER_SITE_OTHER): Payer: Medicare Other | Admitting: Surgery

## 2013-06-19 VITALS — BP 130/85 | HR 70 | Temp 98.1°F | Resp 16 | Ht 63.5 in | Wt 162.0 lb

## 2013-06-19 DIAGNOSIS — Z09 Encounter for follow-up examination after completed treatment for conditions other than malignant neoplasm: Secondary | ICD-10-CM

## 2013-06-19 NOTE — Progress Notes (Signed)
Subjective:     Patient ID: Melinda Eaton, female   DOB: [birth year], 73 y.o.   MRN: [id number]  HPI She is here for another followup visit.  Since resuming the clindamycin, she feels better  Review of Systems     Objective:   Physical Exam On exam, she has had excellent contraction of both groin wounds. The right side is him was completely closed. There is still a wide open area on the left groin which is still much smaller than previous    Assessment:     Patient stable postop     Plan:     She will continue her current wound care and I will see her back next week

## 2013-06-25 ENCOUNTER — Other Ambulatory Visit: Payer: Self-pay | Admitting: Family Medicine

## 2013-06-25 DIAGNOSIS — Z1231 Encounter for screening mammogram for malignant neoplasm of breast: Secondary | ICD-10-CM

## 2013-06-29 ENCOUNTER — Encounter (INDEPENDENT_AMBULATORY_CARE_PROVIDER_SITE_OTHER): Payer: Self-pay | Admitting: Surgery

## 2013-06-29 ENCOUNTER — Ambulatory Visit (INDEPENDENT_AMBULATORY_CARE_PROVIDER_SITE_OTHER): Payer: Medicare Other | Admitting: Surgery

## 2013-06-29 VITALS — BP 130/76 | HR 90 | Temp 98.4°F | Ht 63.0 in | Wt 162.0 lb

## 2013-06-29 DIAGNOSIS — Z09 Encounter for follow-up examination after completed treatment for conditions other than malignant neoplasm: Secondary | ICD-10-CM

## 2013-06-29 NOTE — Progress Notes (Signed)
Subjective:     Patient ID: Melinda Eaton, female   DOB: [birth year], 73 y.o.   MRN: [id number]  HPI She is here for postoperative visit regarding her hidradenitis. She is doing well has no complaints  Review of Systems     Objective:   Physical Exam On exam, the right groin is Almost completely healed. The left groin still shows a small area of open granulation tissue which I again treated with xenograft powder.    Assessment:     Patient doing well postop with excellent healing in both groins     Plan:     She will continue her current wound care and I will see her back in one month

## 2013-07-02 ENCOUNTER — Ambulatory Visit
Admission: RE | Admit: 2013-07-02 | Discharge: 2013-07-02 | Disposition: A | Discharge status: Home / Self Care | Payer: Medicare Other | Source: Ambulatory Visit | Attending: Family Medicine | Admitting: Family Medicine

## 2013-07-02 DIAGNOSIS — Z1231 Encounter for screening mammogram for malignant neoplasm of breast: Secondary | ICD-10-CM

## 2013-07-17 ENCOUNTER — Telehealth (INDEPENDENT_AMBULATORY_CARE_PROVIDER_SITE_OTHER): Payer: Self-pay | Admitting: General Surgery

## 2013-07-17 NOTE — Telephone Encounter (Signed)
Pt of Dr. Ninfa Linden called to report a second episode of "bleeding" from Rt groin surgical site.  She describes an area of swelling that "ruptures and bleeds, then stops."   This has happened twice now since surgery; her post op appt is on 07/30/13.  Please advise her if she needs to be seen sooner.  Recommended she cleanse area in shower with antibacterial soap and rinse well, then pat dry.  Cover are with absorbant gauze.  Probably a hematoma and okay for them to drain, but she wants Dr. Ninfa Linden to advise if she needs to be seen sooner.

## 2013-07-17 NOTE — Telephone Encounter (Signed)
Called patient back to let her know what Dr Ninfa Linden stated that she will have drainage from the surgery site, and bleeding that all of this is normal, patient felt better after I talked to her and I told her to keep her apt with Dr Ninfa Linden on 07-30-13

## 2013-07-30 ENCOUNTER — Encounter (INDEPENDENT_AMBULATORY_CARE_PROVIDER_SITE_OTHER): Payer: Self-pay

## 2013-07-30 ENCOUNTER — Encounter (INDEPENDENT_AMBULATORY_CARE_PROVIDER_SITE_OTHER): Payer: Self-pay | Admitting: Surgery

## 2013-07-30 ENCOUNTER — Ambulatory Visit (INDEPENDENT_AMBULATORY_CARE_PROVIDER_SITE_OTHER): Payer: Medicare Other | Admitting: Surgery

## 2013-07-30 VITALS — BP 144/82 | HR 103 | Temp 97.6°F | Ht 63.0 in | Wt 163.0 lb

## 2013-07-30 DIAGNOSIS — L732 Hidradenitis suppurativa: Secondary | ICD-10-CM

## 2013-07-30 DIAGNOSIS — Z09 Encounter for follow-up examination after completed treatment for conditions other than malignant neoplasm: Secondary | ICD-10-CM

## 2013-07-30 MED ORDER — CLINDAMYCIN HCL 150 MG PO CAPS: 150.0000 mg | ORAL_CAPSULE | Freq: Three times a day (TID) | ORAL | Status: DC

## 2013-07-30 NOTE — Progress Notes (Signed)
Subjective:     Patient ID: Melinda Eaton, female   DOB: February 21, 1941, 73 y.o.   MRN: [id number]  HPI She is here for another postoperative visit. She reports she has had a flareup in both her groins.  Review of Systems     Objective:   Physical Exam On exam, she has several new areas consistent with recurrent hidradenitis in both her groins. There is nothing to drain today    Assessment:     Chronic hidradenitis     Plan:     Again, I explained the disease process to her. This is not a curable disease I explained preoperatively. She will again start her antibiotics both orally and gel. I will see her back in 3 weeks

## 2013-08-20 ENCOUNTER — Encounter (INDEPENDENT_AMBULATORY_CARE_PROVIDER_SITE_OTHER): Payer: Self-pay | Admitting: Surgery

## 2013-08-20 ENCOUNTER — Ambulatory Visit (INDEPENDENT_AMBULATORY_CARE_PROVIDER_SITE_OTHER): Payer: Medicare Other | Admitting: Surgery

## 2013-08-20 VITALS — BP 130/76 | HR 75 | Temp 98.0°F | Ht 63.0 in | Wt 165.0 lb

## 2013-08-20 DIAGNOSIS — L732 Hidradenitis suppurativa: Secondary | ICD-10-CM

## 2013-08-20 NOTE — Progress Notes (Signed)
Subjective:     Patient ID: Melinda Eaton, female   DOB: [birth year], 73 y.o.   MRN: [id number]  HPI She is here for another visit. She has some discomfort in both groins. One there has been draining it has been swelling on the other side.  Review of Systems     Objective:   Physical Exam On exam, there is only one open area in the right groin I treatmented with silver nitrate. The rest of the wounds were closed    Assessment:     Chronic hidradenitis     Plan:     She will continue her current antibiotic regimen and I'll see her back in one month

## 2013-08-28 ENCOUNTER — Other Ambulatory Visit (INDEPENDENT_AMBULATORY_CARE_PROVIDER_SITE_OTHER): Payer: Self-pay | Admitting: Surgery

## 2013-08-28 ENCOUNTER — Other Ambulatory Visit (INDEPENDENT_AMBULATORY_CARE_PROVIDER_SITE_OTHER): Payer: Self-pay | Admitting: General Surgery

## 2013-08-28 ENCOUNTER — Telehealth (INDEPENDENT_AMBULATORY_CARE_PROVIDER_SITE_OTHER): Payer: Self-pay

## 2013-08-28 DIAGNOSIS — L732 Hidradenitis suppurativa: Secondary | ICD-10-CM

## 2013-08-28 MED ORDER — CLINDAMYCIN HCL 150 MG PO CAPS: 150.0000 mg | ORAL_CAPSULE | Freq: Three times a day (TID) | ORAL | Status: DC

## 2013-08-28 MED ORDER — CLINDAMYCIN PHOSPHATE 1 % EX GEL: 1.0000 "application " | Freq: Four times a day (QID) | CUTANEOUS | Status: DC

## 2013-08-28 NOTE — Telephone Encounter (Signed)
Informed pt that Clindamycin was sent to her pharmacy. Pt verbalized understanding.

## 2013-08-28 NOTE — Telephone Encounter (Signed)
Pt s/p recurrent hidradenitis in both her groins. Pt calling to see if Dr Ninfa Linden wanted her to continue to take her Clindamycin. She states that she has ran out and if so she would need a refill. Informed pt that I would send this to Dr Ninfa Linden. Pt verbalized understanding.

## 2013-08-28 NOTE — Telephone Encounter (Signed)
Yes, both oral and the gel until I see her again.  I'll send it to her pharmacy

## 2013-09-21 ENCOUNTER — Other Ambulatory Visit (INDEPENDENT_AMBULATORY_CARE_PROVIDER_SITE_OTHER): Payer: Self-pay | Admitting: Surgery

## 2013-09-21 ENCOUNTER — Ambulatory Visit (INDEPENDENT_AMBULATORY_CARE_PROVIDER_SITE_OTHER): Payer: Medicare Other | Admitting: Surgery

## 2013-09-21 VITALS — BP 134/72 | HR 96 | Temp 98.6°F | Ht 63.0 in | Wt 169.0 lb

## 2013-09-21 DIAGNOSIS — L732 Hidradenitis suppurativa: Secondary | ICD-10-CM

## 2013-09-21 NOTE — Progress Notes (Signed)
Subjective:     Patient ID: Melinda Eaton, female   DOB: 05/11/1940, 73 y.o.   MRN: [id number]  HPI She is here for another followup of her hidradenitis. She reports that she still has an area draining in her right groin as well as a separate area on the left perivaginal area.  Review of Systems     Objective:   Physical Exam On exam, she still has signs of hidradenitis in the groins. Her actual incisions are mostly healing well    Assessment:     Chronic hidradenitis     Plan:     Again, her situation remains difficult given her antibiotic allergies as well as her frustrations. She cannot afford the Clindagel and is currently on Bactroban. She finished her clindamycin and still has discomfort daily in the left groin. After a long discussion with the patient, she like referral back to infectious disease to see if there is anything else they can recommend. I do not believe she needs surgical excision of the area next the vagina at this point and she does not want further surgery. I will see her back here in one month

## 2013-10-15 ENCOUNTER — Ambulatory Visit: Payer: Medicare Other | Admitting: Infectious Diseases

## 2013-10-19 ENCOUNTER — Encounter (INDEPENDENT_AMBULATORY_CARE_PROVIDER_SITE_OTHER): Payer: Medicare Other | Admitting: Surgery

## 2013-10-22 ENCOUNTER — Encounter (INDEPENDENT_AMBULATORY_CARE_PROVIDER_SITE_OTHER): Payer: Self-pay | Admitting: Surgery

## 2013-10-22 ENCOUNTER — Ambulatory Visit (INDEPENDENT_AMBULATORY_CARE_PROVIDER_SITE_OTHER): Payer: Medicare Other | Admitting: Surgery

## 2013-10-22 VITALS — BP 130/76 | HR 68 | Temp 98.0°F | Ht 63.0 in | Wt 167.0 lb

## 2013-10-22 DIAGNOSIS — L732 Hidradenitis suppurativa: Secondary | ICD-10-CM

## 2013-10-22 NOTE — Progress Notes (Signed)
Subjective:     Patient ID: Melinda Eaton, female   DOB: 1940-09-11, 73 y.o.   MRN: [id number]  HPI She is here for another followup of her hidradenitis.  She still has some discomfort in her groins and mild drainage. She is currently only on Bactroban  Review of Systems     Objective:   Physical Exam On exam, her wounds are mostly healed except for 2 small open areas are treated with silver nitrate    Assessment:     hidradenitis     Plan:     We will continue the current wound care and observation one month. She will be seeing infectious disease on September 2 for any further recommendations on antibiotics

## 2013-10-24 ENCOUNTER — Ambulatory Visit (INDEPENDENT_AMBULATORY_CARE_PROVIDER_SITE_OTHER): Payer: Medicare Other | Admitting: Infectious Diseases

## 2013-10-24 ENCOUNTER — Encounter: Payer: Self-pay | Admitting: Infectious Diseases

## 2013-10-24 VITALS — BP 190/80 | HR 100 | Temp 98.7°F | Ht 63.75 in | Wt 167.0 lb

## 2013-10-24 DIAGNOSIS — L732 Hidradenitis suppurativa: Secondary | ICD-10-CM

## 2013-10-24 MED ORDER — CLINDAMYCIN HCL 150 MG PO CAPS: 150.0000 mg | ORAL_CAPSULE | Freq: Three times a day (TID) | ORAL | Status: DC

## 2013-10-24 MED ORDER — CLINDAMYCIN HCL 150 MG PO CAPS: 150.0000 mg | ORAL_CAPSULE | Freq: Every day | ORAL | Status: DC

## 2013-10-24 NOTE — Progress Notes (Signed)
   Subjective:    Patient ID: Melinda Eaton, female    DOB: May 21, 1940, 73 y.o.   MRN: [id number]  HPI 73 yo F with hx of GERD, elevated GLCs, she was seen by her PCP 10-25-12 for boils on her thighs. She had a previous episode in June 2014 (tx with doxy).  She was treated in Sept with bactrim for 2 weeks and topical bactroban.  She returned to her PCP Jan 2015 and felt that she had new lesions. States that the "sulfa was doing something to my liver so they had to stop that". She was given a course of keflex.  She was seen in ID clinic 2-5 and was felt to have hidradenitis suppuritiva- she was started on clindagel. She had wound Cx sent which was (-).  She has been seen by surgery, had debridement. Her lesions have since recurred. She has further f/u with surgery. She may need further debridement.  Today she complains of pain and swelling. Has some drainage, blood. No f/c.  Has been off anbx for > 1 month. She was not able to afford the topical clinda.   Review of Systems     Objective:   Physical Exam  Constitutional: She appears well-developed and well-nourished.  Genitourinary:             Assessment & Plan:

## 2013-10-24 NOTE — Assessment & Plan Note (Signed)
Will refill her oral clinda. She will continue to use bactroban, she is not able to afford the topical clinda. I greatly appreciate Dr Pattricia Boss f/u. She seems amenable to more surgery if this is needed.

## 2013-10-24 NOTE — Addendum Note (Signed)
Addended by: Landis Gandy on: 10/24/2013 11:08 AM   Modules accepted: Orders

## 2013-10-25 ENCOUNTER — Telehealth: Payer: Self-pay

## 2013-10-25 NOTE — Telephone Encounter (Signed)
  [place] calling. Script sent on 10-24-13 for clindamycin with directions of take one daily.. Patient states she was told to take one three times daily.  Dr Algis Downs last note states one three times daily but electronic refill stated once daily.  Please clarify instructions.

## 2013-10-25 NOTE — Telephone Encounter (Signed)
Per Dr Johnnye Sima  Clindamycin 150 mg take three times daily for 7 days (startaaing 10-24-13) then one daily.  Verbal order/read back.  New directions phoned to pharmacy.     Laverle Patter, RN

## 2014-02-25 ENCOUNTER — Ambulatory Visit: Payer: Medicare Other | Admitting: Infectious Diseases

## 2015-12-23 ENCOUNTER — Other Ambulatory Visit: Payer: Self-pay | Admitting: *Deleted

## 2015-12-23 DIAGNOSIS — I6523 Occlusion and stenosis of bilateral carotid arteries: Secondary | ICD-10-CM

## 2015-12-24 ENCOUNTER — Encounter: Payer: Self-pay | Admitting: Vascular Surgery

## 2015-12-25 ENCOUNTER — Ambulatory Visit (INDEPENDENT_AMBULATORY_CARE_PROVIDER_SITE_OTHER): Payer: Medicare PPO | Admitting: Vascular Surgery

## 2015-12-25 ENCOUNTER — Ambulatory Visit (HOSPITAL_COMMUNITY)
Admission: RE | Admit: 2015-12-25 | Discharge: 2015-12-25 | Disposition: A | Discharge status: Home / Self Care | Payer: Medicare PPO | Source: Ambulatory Visit | Attending: Vascular Surgery | Admitting: Vascular Surgery

## 2015-12-25 ENCOUNTER — Encounter: Payer: Self-pay | Admitting: Vascular Surgery

## 2015-12-25 VITALS — BP 142/83 | HR 84 | Temp 97.2°F | Resp 18 | Ht 63.75 in | Wt 157.9 lb

## 2015-12-25 DIAGNOSIS — I6523 Occlusion and stenosis of bilateral carotid arteries: Secondary | ICD-10-CM

## 2015-12-25 LAB — VAS US CAROTID
LCCADSYS: -95 cm/s
LCCAPDIAS: 21 cm/s
LEFT ECA DIAS: -14 cm/s
LEFT VERTEBRAL DIAS: 16 cm/s
LICADSYS: -58 cm/s
LICAPDIAS: 61 cm/s
Left CCA dist dias: -23 cm/s
Left CCA prox sys: 103 cm/s
Left ICA dist dias: -19 cm/s
Left ICA prox sys: 217 cm/s
RCCAPDIAS: 20 cm/s
RIGHT CCA MID DIAS: -19 cm/s
RIGHT ECA DIAS: -13 cm/s
RIGHT VERTEBRAL DIAS: 8 cm/s
Right CCA prox sys: 120 cm/s

## 2015-12-25 NOTE — Progress Notes (Signed)
Patient name: Melinda Eaton MRN: [id number] DOB: 1940/11/23 Sex: female  REASON FOR CONSULT: Carotid disease. Referred by Dr. Jimmye Norman from [facility] family [facility].  HPI: Melinda Eaton is a 75 y.o. female, who was admitted at an outlying hospital on October 16 with a right brain stroke. She had developed left upper extremity weakness, facial droop on the left and slurred speech 2 days prior. She was worked up and found to have a right brain stroke by MRI. MRI also suggested 70% right carotid stenosis. She is sent for vascular consultation.  She is right-handed. She denies any previous history of stroke, TIAs, expressive or receptive aphasia, or amaurosis fugax. She is on aspirin and a statin. Plavix was added after her stroke.  The patient does admit to occasional episodes of chest pressure or "chest aching." She admits to some dyspnea on exertion.  I have reviewed the records that were sent with the patient. The patient was admitted to an outlying hospital on 12/08/2015 with a stroke. She had a 70% right carotid stenosis. This was by MRI. Patient was on aspirin and a statin in addition to Plavix. The patient also has a history of hypertension.  Past Medical History:  Diagnosis Date  . Anemia   . GERD (gastroesophageal reflux disease)   . Hypertension   . Macular degeneration   . Stroke [facility])     Family History  Problem Relation Age of Onset  . Cancer Father     prostate  . Cancer Sister     SOCIAL HISTORY: Social History   Social History  . Marital status: Divorced    Spouse name: N/A  . Number of children: N/A  . Years of education: N/A   Occupational History  . Not on file.   Social History Main Topics  . Smoking status: Current Every Day Smoker    Packs/day: 0.50    Years: 50.00    Types: Cigarettes  . Smokeless tobacco: Never Used  . Alcohol use No  . Drug use: No  . Sexual activity: Not on file   Other Topics Concern  . Not on file   Social  History Narrative  . No narrative on file    Allergies  Allergen Reactions  . Bactrim [Sulfamethoxazole-Trimethoprim] Other (See Comments)    hepatitis  . Doxycycline     Headache     Current Outpatient Prescriptions  Medication Sig Dispense Refill  . amLODipine (NORVASC) 10 MG tablet     . aspirin 81 MG tablet Take 81 mg by mouth daily.    Marland Kitchen atorvastatin (LIPITOR) 40 MG tablet Take 40 mg by mouth.    . clindamycin (CLEOCIN) 150 MG capsule Take 1 capsule (150 mg total) by mouth daily. 30 capsule 3  . clopidogrel (PLAVIX) 75 MG tablet Take 75 mg by mouth.    . hydrochlorothiazide (MICROZIDE) 12.5 MG capsule Take 12.5 mg by mouth daily.    . lansoprazole (PREVACID) 15 MG capsule Take 15 mg by mouth daily at 12 noon.    . Multiple Vitamins-Minerals (PRESERVISION/LUTEIN) CAPS Take 1 capsule by mouth daily.    . quinapril (ACCUPRIL) 20 MG tablet Take 20 mg by mouth at bedtime.    . clindamycin (CLEOCIN) 150 MG capsule Take 150 mg by mouth 3 (three) times daily. Take one tablet three times daily for 7 days (starting 10-24-13) , then one daily .    Marland Kitchen mupirocin ointment (BACTROBAN) 2 % Place 1 application into the nose 2 (two)  times daily.    Marland Kitchen omeprazole (PRILOSEC) 20 MG capsule Take 20 mg by mouth daily.     No current facility-administered medications for this visit.     REVIEW OF SYSTEMS:  [X]  denotes positive finding, [ ]  denotes negative finding Cardiac  Comments:  Chest pain or chest pressure: X   Shortness of breath upon exertion: X   Short of breath when lying flat:    Irregular heart rhythm:        Vascular    Pain in calf, thigh, or hip brought on by ambulation: X   Pain in feet at night that wakes you up from your sleep:     Blood clot in your veins:    Leg swelling:         Pulmonary    Oxygen at home:    Productive cough:  X   Wheezing:         Neurologic    Sudden weakness in arms or legs:     Sudden numbness in arms or legs:     Sudden onset of difficulty  speaking or slurred speech: X   Temporary loss of vision in one eye:  X   Problems with dizziness:         Gastrointestinal    Blood in stool:     Vomited blood:         Genitourinary    Burning when urinating:     Blood in urine:        Psychiatric    Major depression:         Hematologic    Bleeding problems:    Problems with blood clotting too easily:        Skin    Rashes or ulcers:        Constitutional    Fever or chills: X     PHYSICAL EXAM: Vitals:   12/25/15 0950 12/25/15 0951  BP: (!) 150/77 (!) 142/83  Pulse: 84   Resp: 18   Temp: 97.2 F (36.2 C)   TempSrc: Oral   SpO2: 98%   Weight: 157 lb 14.4 oz (71.6 kg)   Height: 5' 3.75" (1.619 m)     GENERAL: The patient is a well-nourished female, in no acute distress. The vital signs are documented above. CARDIAC: There is a regular rate and rhythm.  VASCULAR: She has a right carotid bruit. Both feet are warm and well perfused. She has no significant lower extremity swelling. PULMONARY: There is good air exchange bilaterally without wheezing or rales. ABDOMEN: Soft and non-tender with normal pitched bowel sounds.  MUSCULOSKELETAL: There are no major deformities or cyanosis. NEUROLOGIC: No focal weakness or paresthesias are detected. SKIN: There are no ulcers or rashes noted. PSYCHIATRIC: The patient has a normal affect.  DATA:   CAROTID DUPLEX: I have independent reinterpreted her carotid duplex scan today.  On the right side there is a 60-79% right carotid stenosis. This is noted in the proximal right internal carotid artery.  On the left side there is a 40-59% left carotid stenosis.  MEDICAL ISSUES:  SYMPTOMATIC RIGHT CAROTID STENOSIS: This patient has a symptom attic 70% right carotid stenosis. I have recommended right carotid endarterectomy in order to lower her risk of future stroke. Her risk of stroke without surgery is 10% in the next year and then 6% per year thereafter.I have reviewed the  indications for carotid endarterectomy, that is to lower the risk of future stroke. I have also reviewed the  potential complications of surgery, including but not limited to: bleeding, stroke (perioperative risk 1-2%), MI, nerve injury of other unpredictable medical problems. All of the patients questions were answered and they are agreeable to proceed with surgery.   She does admit to some occasional chest aching and chest pressure and for this reason we will obtain preoperative cardiac evaluation before scheduling her surgery. I did discuss the case today with [facility] and they would like [place] to arrange for cardiac clearance. Once we have a date for surgery we will need to hold her Plavix 5 days preoperatively and have her continue her aspirin. She is on a statin.    Deitra Mayo [facility] [phone]

## 2015-12-29 ENCOUNTER — Encounter: Payer: Self-pay | Admitting: Physician Assistant

## 2015-12-29 ENCOUNTER — Ambulatory Visit (INDEPENDENT_AMBULATORY_CARE_PROVIDER_SITE_OTHER): Payer: Medicare PPO | Admitting: Physician Assistant

## 2015-12-29 ENCOUNTER — Telehealth (HOSPITAL_COMMUNITY): Payer: Self-pay | Admitting: *Deleted

## 2015-12-29 VITALS — BP 144/72 | HR 72 | Ht 63.75 in | Wt 157.2 lb

## 2015-12-29 DIAGNOSIS — I6523 Occlusion and stenosis of bilateral carotid arteries: Secondary | ICD-10-CM

## 2015-12-29 DIAGNOSIS — Z01810 Encounter for preprocedural cardiovascular examination: Secondary | ICD-10-CM | POA: Diagnosis not present

## 2015-12-29 DIAGNOSIS — R079 Chest pain, unspecified: Secondary | ICD-10-CM

## 2015-12-29 DIAGNOSIS — Z720 Tobacco use: Secondary | ICD-10-CM | POA: Diagnosis not present

## 2015-12-29 NOTE — Telephone Encounter (Signed)
Left message on voicemail in reference to upcoming appointment scheduled for  12/31/15. Phone number given for a call back so details instructions can be given.  Hubbard Robinson, RN

## 2015-12-29 NOTE — Progress Notes (Signed)
Cardiology Office Note    Date:  12/29/2015   ID:  Melinda Eaton, DOB 04-Jul-1940, MRN [id number]  PCP:  Woody Seller, MD  Cardiologist: new Dr. Angelena Form  Chief Complaint  Patient presents with  . New Patient (Initial Visit)    History of Present Illness:  Melinda Eaton is a 75 y.o. female referred to [place] by Dr. Scot Dock preoperative clearance for carotid endarterectomy. She was admitted to an [facility] October 16 with a right brain stroke with left upper extremity weakness, facial droop and slurred speech. She was placed on Plavix, ASA, and a statin. She was found to have a 70% right carotid stenosis. Preop exam she admitted to occasional chest pressure or aching and dyspnea on exertion.  Patient comes in accompanied by daughter in law. She has chest tightness without associated shortness of breath, dizziness, radiation, diaphoresis. Occurs with exercise and at rest. Walks 1/4 mile 3 times a day and doesn't have chest tightness. Has noticed it for about 6-12 months and eases spontaneously with deep breathing. She has HTN, smokes 6 cigs a day, 120 pk years.     Past Medical History:  Diagnosis Date  . Anemia   . GERD (gastroesophageal reflux disease)   . Hypertension   . Macular degeneration   . Stroke [facility])     Past Surgical History:  Procedure Laterality Date  . ABDOMINAL HYSTERECTOMY    . HYDRADENITIS EXCISION Bilateral 05/07/2013   Procedure: WIDE EXCISION HIDRADENITIS BILATERAL GROINS, PLACEMENT OF XENO GRAFT;  Surgeon: Harl Bowie, MD;  Location: [place];  Service: General;  Laterality: Bilateral;    Current Medications: Outpatient Medications Prior to Visit  Medication Sig Dispense Refill  . amLODipine (NORVASC) 10 MG tablet     . atorvastatin (LIPITOR) 40 MG tablet Take 40 mg by mouth.    . clindamycin (CLEOCIN) 150 MG capsule Take 1 capsule (150 mg total) by mouth daily. 30 capsule 3  . clopidogrel (PLAVIX) 75 MG tablet Take 75 mg by  mouth.    . lansoprazole (PREVACID) 15 MG capsule Take 15 mg by mouth daily at 12 noon.    . Multiple Vitamins-Minerals (PRESERVISION/LUTEIN) CAPS Take 1 capsule by mouth daily.    . mupirocin ointment (BACTROBAN) 2 % Place 1 application into the nose 2 (two) times daily.    Marland Kitchen omeprazole (PRILOSEC) 20 MG capsule Take 20 mg by mouth daily.    Marland Kitchen aspirin 81 MG tablet Take 81 mg by mouth daily.    . clindamycin (CLEOCIN) 150 MG capsule Take 150 mg by mouth 3 (three) times daily. Take one tablet three times daily for 7 days (starting 10-24-13) , then one daily .    . hydrochlorothiazide (MICROZIDE) 12.5 MG capsule Take 12.5 mg by mouth daily.    . quinapril (ACCUPRIL) 20 MG tablet Take 20 mg by mouth at bedtime.     No facility-administered medications prior to visit.      Allergies:   Bactrim [sulfamethoxazole-trimethoprim] and Doxycycline   Social History   Social History  . Marital status: Divorced    Spouse name: N/A  . Number of children: N/A  . Years of education: N/A   Social History Main Topics  . Smoking status: Current Every Day Smoker    Packs/day: 0.50    Years: 50.00    Types: Cigarettes  . Smokeless tobacco: Never Used  . Alcohol use No  . Drug use: No  . Sexual activity: Not on file  Other Topics Concern  . Not on file   Social History Narrative  . No narrative on file     Family History:  [name] family history includes Cancer in her father and sister.   ROS:   Please see the history of present illness.    Review of Systems  Constitution: Negative.  HENT: Negative.   Eyes: Negative.   Cardiovascular: Positive for chest pain.  Respiratory: Negative.   Hematologic/Lymphatic: Negative.   Musculoskeletal: Negative.  Negative for joint pain.  Gastrointestinal: Negative.   Genitourinary: Negative.   Neurological: Negative.    All other systems reviewed and are negative.   PHYSICAL EXAM:   VS:  BP (!) 144/72 (BP Location: Left Arm, Patient Position:  Sitting)   Pulse 72   Ht 5' 3.75" (1.619 m)   Wt 157 lb 3.2 oz (71.3 kg)   SpO2 99%   BMI 27.20 kg/m   Physical Exam  GEN: Well nourished, well developed, in no acute distress  Neck: bilateral carotid bruits,no JVD, or masses Cardiac:RRR; no murmurs, rubs, or gallops  Respiratory:  clear to auscultation bilaterally, normal work of breathing GI: soft, nontender, nondistended, + BS Ext: without cyanosis, clubbing, or edema, Good distal pulses bilaterally MS: no deformity or atrophy  Skin: warm and dry, no rash Neuro:  Alert and Oriented x 3, mouth droop on left Psych: euthymic mood, full affect  Wt Readings from Last 3 Encounters:  12/29/15 157 lb 3.2 oz (71.3 kg)  12/25/15 157 lb 14.4 oz (71.6 kg)  10/24/13 167 lb (75.8 kg)      Studies/Labs Reviewed:   EKG:  EKG is ordered today.  The ekg ordered today demonstrates NSR at 96/m no acute change  Recent Labs: No results found for requested labs within last 8760 hours.   Lipid Panel No results found for: CHOL, TRIG, HDL, CHOLHDL, VLDL, LDLCALC, LDLDIRECT  Additional studies/ records that were reviewed today include:      ASSESSMENT:    1. Chest pain, unspecified type   2. Preoperative cardiovascular examination   3. Bilateral carotid artery stenosis   4. Tobacco abuse      PLAN:  In order of problems listed above:  Chest pain somewhat atypical but multiple cardiac risk factors including HTN, 120 pk yrs of smoking, carotid disease. Discussed with Dr. Angelena Form who concurs to evaluate with a lexiscan myoview. F/U with Dr. Angelena Form if abnormal.   Preoperative vascular examination before undergoing carotid endarterectomy. Patient has some chest pain that somewhat atypical but multiple cardiac risk factors. Proceed with stress testing as discussed above.  Bilateral carotid stenosis to have surgery  Tobacco abuse patient was given nicotine patches but hasn't used them because she is having hard time giving up her  cigarettes. Had a long discussion about the importance of smoking cessation.   Medication Adjustments/Labs and Tests Ordered: Current medicines are reviewed at length with the patient today.  Concerns regarding medicines are outlined above.  Medication changes, Labs and Tests ordered today are listed in the Patient Instructions below. Patient Instructions  Your physician recommends that you continue on your current medications as directed. Please refer to the Current Medication list given to you today. Your physician has requested that you have a lexiscan myoview. For further information please visit [email or web address]. Please follow instruction sheet, as given.  Follow up with your physician will depend on test results.  If stress test is abnormal, follow up with Dr. Angelena Form.   Your physician discussed the hazards  of tobacco use. Tobacco use cessation is recommended and techniques and options to help you quit were discussed.       Signed, Ermalinda Barrios, PA-C  12/29/2015 2:20 PM    [facility] [place], [place], [place]  [address] Phone: [phone]; Fax: [phone]

## 2015-12-29 NOTE — Patient Instructions (Signed)
Your physician recommends that you continue on your current medications as directed. Please refer to the Current Medication list given to you today. Your physician has requested that you have a lexiscan myoview. For further information please visit [email or web address]. Please follow instruction sheet, as given.  Follow up with your physician will depend on test results.  If stress test is abnormal, follow up with Dr. Angelena Form.   Your physician discussed the hazards of tobacco use. Tobacco use cessation is recommended and techniques and options to help you quit were discussed.

## 2015-12-30 ENCOUNTER — Ambulatory Visit (HOSPITAL_COMMUNITY): Payer: Medicare PPO | Attending: Internal Medicine

## 2015-12-30 ENCOUNTER — Other Ambulatory Visit: Payer: Self-pay

## 2015-12-30 DIAGNOSIS — R079 Chest pain, unspecified: Secondary | ICD-10-CM | POA: Diagnosis not present

## 2015-12-30 LAB — MYOCARDIAL PERFUSION IMAGING
CHL CUP NUCLEAR SDS: 1
CHL CUP NUCLEAR SRS: 0
CHL CUP NUCLEAR SSS: 1
LHR: 0.26
LV dias vol: 78 mL (ref 46–106)
LV sys vol: 33 mL
Peak HR: 117 {beats}/min
Rest HR: 95 {beats}/min
TID: 0.98

## 2015-12-30 MED ORDER — REGADENOSON 0.4 MG/5ML IV SOLN
0.4000 mg | Freq: Once | INTRAVENOUS | Status: AC
  Administered 2015-12-30: 0.4 mg via INTRAVENOUS

## 2015-12-30 MED ORDER — TECHNETIUM TC 99M TETROFOSMIN IV KIT
10.3000 | PACK | Freq: Once | INTRAVENOUS | Status: AC | PRN
  Administered 2015-12-30: 10.3 via INTRAVENOUS
  Filled 2015-12-30: qty 11

## 2015-12-30 MED ORDER — TECHNETIUM TC 99M TETROFOSMIN IV KIT
31.4000 | PACK | Freq: Once | INTRAVENOUS | Status: AC | PRN
  Administered 2015-12-30: 31.4 via INTRAVENOUS
  Filled 2015-12-30: qty 32

## 2015-12-31 ENCOUNTER — Encounter: Payer: Self-pay | Admitting: *Deleted

## 2016-01-06 ENCOUNTER — Encounter: Payer: Self-pay | Admitting: *Deleted

## 2016-01-06 NOTE — Addendum Note (Signed)
Addended by: Rodman Key on: 01/06/2016 09:41 AM   Modules accepted: Orders

## 2016-01-20 NOTE — Pre-Procedure Instructions (Signed)
Melinda Eaton  01/20/2016      [id number] - [place], [place]. [place] [address] Phone: [phone] Fax: [phone]    Your procedure is scheduled on December 5  Report to [place] at [place].M.  Call this number if you have problems the morning of surgery:  [phone]   Remember:  Do not eat food or drink liquids after midnight.   Take these medicines the morning of surgery with A SIP OF WATER amLODipine (NORVASC),  clindamycin (CLEOCIN), lansoprazole (PREVACID), omeprazole (PRILOSEC), aspirin  7 days prior to surgery STOP taking any  Aleve, Naproxen, Ibuprofen, Motrin, Advil, Goody's, BC's, all herbal medications, fish oil, and all vitamins    Do not wear jewelry, make-up or nail polish.  Do not wear lotions, powders, or perfumes, or deoderant.  Do not shave 48 hours prior to surgery.    Do not bring valuables to the hospital.  [facility] is not responsible for any belongings or valuables.  Contacts, dentures or bridgework may not be worn into surgery.  Leave your suitcase in the car.  After surgery it may be brought to your room.  For patients admitted to the hospital, discharge time will be determined by your treatment team.  Patients discharged the day of surgery will not be allowed to drive home.    Special instructions:   [facility]- Preparing For Surgery  Before surgery, you can play an important role. Because skin is not sterile, your skin needs to be as free of germs as possible. You can reduce the number of germs on your skin by washing with CHG (chlorahexidine gluconate) Soap before surgery.  CHG is an antiseptic cleaner which kills germs and bonds with the skin to continue killing germs even after washing.  Please do not use if you have an allergy to CHG or antibacterial soaps. If your skin becomes reddened/irritated stop using the CHG.  Do not shave  (including legs and underarms) for at least 48 hours prior to first CHG shower. It is OK to shave your face.  Please follow these instructions carefully.   1. Shower the NIGHT BEFORE SURGERY and the MORNING OF SURGERY with CHG.   2. If you chose to wash your hair, wash your hair first as usual with your normal shampoo.  3. After you shampoo, rinse your hair and body thoroughly to remove the shampoo.  4. Use CHG as you would any other liquid soap. You can apply CHG directly to the skin and wash gently with a scrungie or a clean washcloth.   5. Apply the CHG Soap to your body ONLY FROM THE NECK DOWN.  Do not use on open wounds or open sores. Avoid contact with your eyes, ears, mouth and genitals (private parts). Wash genitals (private parts) with your normal soap.  6. Wash thoroughly, paying special attention to the area where your surgery will be performed.  7. Thoroughly rinse your body with warm water from the neck down.  8. DO NOT shower/wash with your normal soap after using and rinsing off the CHG Soap.  9. Pat yourself dry with a CLEAN TOWEL.   10. Wear CLEAN PAJAMAS   11. Place CLEAN SHEETS on your bed the night of your first shower and DO NOT SLEEP WITH PETS.    Day of Surgery: Do not apply any deodorants/lotions. Please wear clean clothes to the hospital/surgery center.  Please read over the following fact sheets that you were given.

## 2016-01-21 ENCOUNTER — Encounter (HOSPITAL_COMMUNITY): Payer: Self-pay

## 2016-01-21 ENCOUNTER — Encounter (HOSPITAL_COMMUNITY)
Admission: RE | Admit: 2016-01-21 | Discharge: 2016-01-21 | Disposition: A | Discharge status: Home / Self Care | Payer: Medicare PPO | Source: Ambulatory Visit | Attending: Vascular Surgery | Admitting: Vascular Surgery

## 2016-01-21 DIAGNOSIS — Z8673 Personal history of transient ischemic attack (TIA), and cerebral infarction without residual deficits: Secondary | ICD-10-CM | POA: Diagnosis not present

## 2016-01-21 DIAGNOSIS — Z881 Allergy status to other antibiotic agents status: Secondary | ICD-10-CM | POA: Diagnosis not present

## 2016-01-21 DIAGNOSIS — I6529 Occlusion and stenosis of unspecified carotid artery: Secondary | ICD-10-CM | POA: Diagnosis not present

## 2016-01-21 DIAGNOSIS — F17210 Nicotine dependence, cigarettes, uncomplicated: Secondary | ICD-10-CM | POA: Diagnosis not present

## 2016-01-21 DIAGNOSIS — Z809 Family history of malignant neoplasm, unspecified: Secondary | ICD-10-CM | POA: Diagnosis not present

## 2016-01-21 DIAGNOSIS — Z01812 Encounter for preprocedural laboratory examination: Secondary | ICD-10-CM | POA: Diagnosis present

## 2016-01-21 DIAGNOSIS — Z8042 Family history of malignant neoplasm of prostate: Secondary | ICD-10-CM | POA: Diagnosis not present

## 2016-01-21 DIAGNOSIS — I10 Essential (primary) hypertension: Secondary | ICD-10-CM | POA: Insufficient documentation

## 2016-01-21 DIAGNOSIS — Z79899 Other long term (current) drug therapy: Secondary | ICD-10-CM | POA: Insufficient documentation

## 2016-01-21 DIAGNOSIS — Z7982 Long term (current) use of aspirin: Secondary | ICD-10-CM | POA: Diagnosis not present

## 2016-01-21 HISTORY — DX: Headache, unspecified: R51.9

## 2016-01-21 HISTORY — DX: Headache: R51

## 2016-01-21 HISTORY — DX: Benign neoplasm of connective and other soft tissue, unspecified: D21.9

## 2016-01-21 HISTORY — DX: Inflammatory liver disease, unspecified: K75.9

## 2016-01-21 LAB — COMPREHENSIVE METABOLIC PANEL
ALK PHOS: 80 U/L (ref 38–126)
ALT: 12 U/L — AB (ref 14–54)
AST: 18 U/L (ref 15–41)
Albumin: 4.1 g/dL (ref 3.5–5.0)
Anion gap: 11 (ref 5–15)
BILIRUBIN TOTAL: 0.6 mg/dL (ref 0.3–1.2)
BUN: 10 mg/dL (ref 6–20)
CALCIUM: 9.4 mg/dL (ref 8.9–10.3)
CO2: 27 mmol/L (ref 22–32)
CREATININE: 0.76 mg/dL (ref 0.44–1.00)
Chloride: 98 mmol/L — ABNORMAL LOW (ref 101–111)
Glucose, Bld: 128 mg/dL — ABNORMAL HIGH (ref 65–99)
Potassium: 3.2 mmol/L — ABNORMAL LOW (ref 3.5–5.1)
Sodium: 136 mmol/L (ref 135–145)
TOTAL PROTEIN: 7 g/dL (ref 6.5–8.1)

## 2016-01-21 LAB — APTT: aPTT: 30 seconds (ref 24–36)

## 2016-01-21 LAB — CBC
HEMATOCRIT: 42.4 % (ref 36.0–46.0)
HEMOGLOBIN: 14.7 g/dL (ref 12.0–15.0)
MCH: 29.7 pg (ref 26.0–34.0)
MCHC: 34.7 g/dL (ref 30.0–36.0)
MCV: 85.7 fL (ref 78.0–100.0)
PLATELETS: 211 10*3/uL (ref 150–400)
RBC: 4.95 MIL/uL (ref 3.87–5.11)
RDW: 13.8 % (ref 11.5–15.5)
WBC: 7.8 10*3/uL (ref 4.0–10.5)

## 2016-01-21 LAB — URINALYSIS, ROUTINE W REFLEX MICROSCOPIC
Bilirubin Urine: NEGATIVE
Glucose, UA: NEGATIVE mg/dL
HGB URINE DIPSTICK: NEGATIVE
Ketones, ur: NEGATIVE mg/dL
Leukocytes, UA: NEGATIVE
NITRITE: NEGATIVE
PROTEIN: NEGATIVE mg/dL
SPECIFIC GRAVITY, URINE: 1.009 (ref 1.005–1.030)
pH: 7 (ref 5.0–8.0)

## 2016-01-21 LAB — PROTIME-INR
INR: 1.01
PROTHROMBIN TIME: 13.3 s (ref 11.4–15.2)

## 2016-01-21 LAB — SURGICAL PCR SCREEN
MRSA, PCR: NEGATIVE
STAPHYLOCOCCUS AUREUS: NEGATIVE

## 2016-01-21 NOTE — Progress Notes (Addendum)
PCP - Kathryne Eriksson Cardiologist - Dr. Angelena Form  Chest x-ray - not needed EKG - 12/29/15 Stress Test - 12/30/15 ECHO - requesting Cardiac Cath - denies  Patient had a CVA back in October 2017 was taken to [facility] where CVA workup was completed.  Requesting records from that hospital visit.    Patient saw cardiology November 8 and received Cardiac Clearance for surgery.  Patient was having chest "aching" at the time of this appointment and was worked up for that.  Patient has not had any symptoms of the chest "aching" since she was seen by cardiology.    Patient was instructed to hold plavix for 5 days prior to surgery patient will take last dose tomororw the 30th.  Patient wanted to make Dr Scot Dock aware of a "boil" that appared on her right upper thigh that is near the groin area. At this time the patient believes that it is infected and is currently taking antibiotics for that.  Rn called Colletta [place] with dr dickson's office and left message about this.  Asked her to call back.  3:17 PM I have not heard back from stephanie at Dr Nicole Cella office I will contact her to make sure she received my message  3:37 PM Spoke with stephanie about boil, instructed to notify patient that it will ok for surgery but she will need to be seen by her PCP for treatment.  Patient verbalized understanding about seeing PCP for boil. Patient is taking Clindamycin for treatment of that boil.       Patient denies shortness of breath, fever, cough and chest pain at PAT appointment

## 2016-01-22 ENCOUNTER — Ambulatory Visit (INDEPENDENT_AMBULATORY_CARE_PROVIDER_SITE_OTHER): Payer: Medicare PPO | Admitting: Neurology

## 2016-01-22 ENCOUNTER — Encounter: Payer: Self-pay | Admitting: Neurology

## 2016-01-22 DIAGNOSIS — I63131 Cerebral infarction due to embolism of right carotid artery: Secondary | ICD-10-CM

## 2016-01-22 DIAGNOSIS — I63139 Cerebral infarction due to embolism of unspecified carotid artery: Secondary | ICD-10-CM | POA: Insufficient documentation

## 2016-01-22 HISTORY — DX: Cerebral infarction due to embolism of unspecified carotid artery: I63.139

## 2016-01-22 NOTE — Patient Instructions (Addendum)
Stroke Prevention Some medical conditions and behaviors are associated with an increased chance of having a stroke. You may prevent a stroke by making healthy choices and managing medical conditions. How can I reduce my risk of having a stroke?  Stay physically active. Get at least 30 minutes of activity on most or all days.  Do not smoke. It may also be helpful to avoid exposure to secondhand smoke.  Limit alcohol use. Moderate alcohol use is considered to be:  No more than 2 drinks per day for men.  No more than 1 drink per day for nonpregnant women.  Eat healthy foods. This involves:  Eating 5 or more servings of fruits and vegetables a day.  Making dietary changes that address high blood pressure (hypertension), high cholesterol, diabetes, or obesity.  Manage your cholesterol levels.  Making food choices that are high in fiber and low in saturated fat, trans fat, and cholesterol may control cholesterol levels.  Take any prescribed medicines to control cholesterol as directed by your health care provider.  Manage your diabetes.  Controlling your carbohydrate and sugar intake is recommended to manage diabetes.  Take any prescribed medicines to control diabetes as directed by your health care provider.  Control your hypertension.  Making food choices that are low in salt (sodium), saturated fat, trans fat, and cholesterol is recommended to manage hypertension.  Ask your health care provider if you need treatment to lower your blood pressure. Take any prescribed medicines to control hypertension as directed by your health care provider.  If you are 18-39 years of age, have your blood pressure checked every 3-5 years. If you are 40 years of age or older, have your blood pressure checked every year.  Maintain a healthy weight.  Reducing calorie intake and making food choices that are low in sodium, saturated fat, trans fat, and cholesterol are recommended to manage  weight.  Stop drug abuse.  Avoid taking birth control pills.  Talk to your health care provider about the risks of taking birth control pills if you are over 35 years old, smoke, get migraines, or have ever had a blood clot.  Get evaluated for sleep disorders (sleep apnea).  Talk to your health care provider about getting a sleep evaluation if you snore a lot or have excessive sleepiness.  Take medicines only as directed by your health care provider.  For some people, aspirin or blood thinners (anticoagulants) are helpful in reducing the risk of forming abnormal blood clots that can lead to stroke. If you have the irregular heart rhythm of atrial fibrillation, you should be on a blood thinner unless there is a good reason you cannot take them.  Understand all your medicine instructions.  Make sure that other conditions (such as anemia or atherosclerosis) are addressed. Get help right away if:  You have sudden weakness or numbness of the face, arm, or leg, especially on one side of the body.  Your face or eyelid droops to one side.  You have sudden confusion.  You have trouble speaking (aphasia) or understanding.  You have sudden trouble seeing in one or both eyes.  You have sudden trouble walking.  You have dizziness.  You have a loss of balance or coordination.  You have a sudden, severe headache with no known cause.  You have new chest pain or an irregular heartbeat. Any of these symptoms may represent a serious problem that is an emergency. Do not wait to see if the symptoms will go away.   Get medical help at once. Call your local emergency services (911 in [place] Do not drive yourself to the hospital. This information is not intended to replace advice given to you by your health care provider. Make sure you discuss any questions you have with your health care provider. Document Released: 03/18/2004 Document Revised: 07/17/2015 Document Reviewed: 08/11/2012 Elsevier  Interactive Patient Education  2017 [employer]  
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
## 2016-01-22 NOTE — Progress Notes (Addendum)
Anesthesia Chart Review:  Pt is a 75 year old female scheduled for R CEA on 01/27/2016 with Deitra Mayo, MD.   - PCP is Kathryne Eriksson, MD  PMH includes:  HTN, stroke [facility]), anemia. Current smoker. BMI 28  Medications include: amlodipine, ASA, lipitor, clindamycin, plavix, hctz, metoprolol, prilosec, quinapril. Pt to hold plavix 5 days before surgery.   Preoperative labs reviewed.   EKG 12/29/15: NSR. Possible LAE.   Nuclear stress test 12/30/15:   Nuclear stress EF: 57%. No obvious wall motion abnormalities.  Defect 1: There is a small defect of mild severity present in the apex location.  There was no ST segment deviation noted during stress.  This is a low risk study. No ischemia identified.  Carotid duplex 12/25/15: 60-79% R proximal ICA stenosis. Higher end of 40-59% L proximal ICA stenosis.  Echo 12/08/15:  1. There is no obvious cardiac source of embolus noted on this transthoracic echo. Follow up with a TEE is suggested if cardiac source is still suspected. LV is normal in size. There is mild concentric LVH. EF greater than 60%. To normalize left ventricular relaxation, which is associated with grade I I/IV or mild to moderate diastolic dysfunction. 2. Trace mitral regurgitation. 3. Aortic sclerosis without stenosis. No aortic regurgitation. 4. Trace tricuspid regurgitation.  Pt saw Ermalinda Barrios, PA with cardiology 12/29/15 for pre-op eval. Stress test ordered, results above, pt cleared for surgery.   Pt reported at PAT she has an abscess in her upper thigh/groin area that she is taking clindamycin for.  Stephanie in Dr. Nicole Cella office notified by PAT RN.   If no acute signs infection due to abscess DOS, I anticipate pt can proceed as scheduled.   Willeen Cass, FNP-BC [facility]/Anesthesiology Phone: [phone] 01/23/2016 1:03 PM

## 2016-01-27 ENCOUNTER — Inpatient Hospital Stay (HOSPITAL_COMMUNITY): Payer: Medicare PPO | Admitting: Certified Registered Nurse Anesthetist

## 2016-01-27 ENCOUNTER — Encounter (HOSPITAL_COMMUNITY): Payer: Self-pay | Admitting: *Deleted

## 2016-01-27 ENCOUNTER — Inpatient Hospital Stay (HOSPITAL_COMMUNITY)
Admission: RE | Admit: 2016-01-27 | Discharge: 2016-01-28 | DRG: 039 | Disposition: A | Discharge status: Home / Self Care | Payer: Medicare PPO | Source: Ambulatory Visit | Attending: Vascular Surgery | Admitting: Vascular Surgery

## 2016-01-27 ENCOUNTER — Inpatient Hospital Stay (HOSPITAL_COMMUNITY): Payer: Medicare PPO | Admitting: Vascular Surgery

## 2016-01-27 ENCOUNTER — Encounter (HOSPITAL_COMMUNITY)
Admission: RE | Disposition: A | Discharge status: Home / Self Care | Payer: Self-pay | Source: Ambulatory Visit | Attending: Vascular Surgery

## 2016-01-27 DIAGNOSIS — Z7902 Long term (current) use of antithrombotics/antiplatelets: Secondary | ICD-10-CM

## 2016-01-27 DIAGNOSIS — I10 Essential (primary) hypertension: Secondary | ICD-10-CM | POA: Diagnosis present

## 2016-01-27 DIAGNOSIS — Z881 Allergy status to other antibiotic agents status: Secondary | ICD-10-CM

## 2016-01-27 DIAGNOSIS — Z8042 Family history of malignant neoplasm of prostate: Secondary | ICD-10-CM

## 2016-01-27 DIAGNOSIS — H3530 Unspecified macular degeneration: Secondary | ICD-10-CM | POA: Diagnosis not present

## 2016-01-27 DIAGNOSIS — Z7982 Long term (current) use of aspirin: Secondary | ICD-10-CM | POA: Diagnosis not present

## 2016-01-27 DIAGNOSIS — R1310 Dysphagia, unspecified: Secondary | ICD-10-CM | POA: Diagnosis not present

## 2016-01-27 DIAGNOSIS — Z792 Long term (current) use of antibiotics: Secondary | ICD-10-CM

## 2016-01-27 DIAGNOSIS — I6523 Occlusion and stenosis of bilateral carotid arteries: Secondary | ICD-10-CM | POA: Diagnosis not present

## 2016-01-27 DIAGNOSIS — Z9981 Dependence on supplemental oxygen: Secondary | ICD-10-CM

## 2016-01-27 DIAGNOSIS — Z882 Allergy status to sulfonamides status: Secondary | ICD-10-CM | POA: Diagnosis not present

## 2016-01-27 DIAGNOSIS — K219 Gastro-esophageal reflux disease without esophagitis: Secondary | ICD-10-CM | POA: Diagnosis present

## 2016-01-27 DIAGNOSIS — I6521 Occlusion and stenosis of right carotid artery: Secondary | ICD-10-CM

## 2016-01-27 DIAGNOSIS — Z79899 Other long term (current) drug therapy: Secondary | ICD-10-CM

## 2016-01-27 DIAGNOSIS — F17210 Nicotine dependence, cigarettes, uncomplicated: Secondary | ICD-10-CM | POA: Diagnosis present

## 2016-01-27 DIAGNOSIS — Z8673 Personal history of transient ischemic attack (TIA), and cerebral infarction without residual deficits: Secondary | ICD-10-CM | POA: Diagnosis not present

## 2016-01-27 DIAGNOSIS — I679 Cerebrovascular disease, unspecified: Secondary | ICD-10-CM | POA: Diagnosis present

## 2016-01-27 HISTORY — PX: ENDARTERECTOMY: SHX5162

## 2016-01-27 HISTORY — PX: PATCH ANGIOPLASTY: SHX6230

## 2016-01-27 LAB — TYPE AND SCREEN
ABO/RH(D): A NEG
ANTIBODY SCREEN: POSITIVE

## 2016-01-27 LAB — CREATININE, SERUM
CREATININE: 0.67 mg/dL (ref 0.44–1.00)
GFR calc non Af Amer: >60 mL/min (ref >60)

## 2016-01-27 LAB — CBC
HCT: 35.4 % — ABNORMAL LOW (ref 36.0–46.0)
Hemoglobin: 12.1 g/dL (ref 12.0–15.0)
MCH: 29.3 pg (ref 26.0–34.0)
MCHC: 34.2 g/dL (ref 30.0–36.0)
MCV: 85.7 fL (ref 78.0–100.0)
PLATELETS: 179 10*3/uL (ref 150–400)
RBC: 4.13 MIL/uL (ref 3.87–5.11)
RDW: 14.1 % (ref 11.5–15.5)
WBC: 10.2 10*3/uL (ref 4.0–10.5)

## 2016-01-27 SURGERY — ENDARTERECTOMY, CAROTID
Anesthesia: General | Site: Neck | Laterality: Right

## 2016-01-27 MED ORDER — DEXTRAN 40 IN SALINE 10-0.9 % IV SOLN
INTRAVENOUS | Status: DC | PRN
  Administered 2016-01-27: 500 mL

## 2016-01-27 MED ORDER — THROMBIN 20000 UNITS EX SOLR
CUTANEOUS | Status: DC | PRN
  Administered 2016-01-27: 10:00:00 via TOPICAL

## 2016-01-27 MED ORDER — LIDOCAINE HCL (CARDIAC) 20 MG/ML IV SOLN
INTRAVENOUS | Status: DC | PRN
  Administered 2016-01-27: 60 mg via INTRAVENOUS

## 2016-01-27 MED ORDER — LABETALOL HCL 5 MG/ML IV SOLN
INTRAVENOUS | Status: DC | PRN
  Administered 2016-01-27 (×2): 10 mg via INTRAVENOUS

## 2016-01-27 MED ORDER — QUINAPRIL HCL 10 MG PO TABS: 80.0000 mg | ORAL_TABLET | Freq: Every day | ORAL | Status: DC

## 2016-01-27 MED ORDER — SODIUM CHLORIDE 0.9 % IV SOLN
0.0125 ug/kg/min | INTRAVENOUS | Status: AC
  Administered 2016-01-27: .1 ug/kg/min via INTRAVENOUS
  Filled 2016-01-27: qty 2000

## 2016-01-27 MED ORDER — ONDANSETRON HCL 4 MG/2ML IJ SOLN: 4.0000 mg | Freq: Once | INTRAMUSCULAR | Status: DC | PRN

## 2016-01-27 MED ORDER — LACTATED RINGERS IV SOLN
INTRAVENOUS | Status: DC | PRN
  Administered 2016-01-27 (×2): via INTRAVENOUS

## 2016-01-27 MED ORDER — FENTANYL CITRATE (PF) 100 MCG/2ML IJ SOLN: 25.0000 ug | INTRAMUSCULAR | Status: DC | PRN

## 2016-01-27 MED ORDER — OXYCODONE HCL 5 MG PO TABS
5.0000 mg | ORAL_TABLET | Freq: Once | ORAL | Status: AC | PRN
  Administered 2016-01-27: 5 mg via ORAL

## 2016-01-27 MED ORDER — LABETALOL HCL 5 MG/ML IV SOLN: 10.0000 mg | INTRAVENOUS | Status: DC | PRN

## 2016-01-27 MED ORDER — CHLORHEXIDINE GLUCONATE CLOTH 2 % EX PADS: 6.0000 | MEDICATED_PAD | Freq: Once | CUTANEOUS | Status: DC

## 2016-01-27 MED ORDER — EPHEDRINE 5 MG/ML INJ
INTRAVENOUS | Status: AC
  Filled 2016-01-27: qty 10

## 2016-01-27 MED ORDER — PROPOFOL 10 MG/ML IV BOLUS
INTRAVENOUS | Status: AC
  Filled 2016-01-27: qty 20

## 2016-01-27 MED ORDER — PHENYLEPHRINE HCL 10 MG/ML IJ SOLN
INTRAVENOUS | Status: DC | PRN
  Administered 2016-01-27: 30 ug/min via INTRAVENOUS

## 2016-01-27 MED ORDER — ROCURONIUM BROMIDE 100 MG/10ML IV SOLN
INTRAVENOUS | Status: DC | PRN
  Administered 2016-01-27: 40 mg via INTRAVENOUS
  Administered 2016-01-27: 15 mg via INTRAVENOUS

## 2016-01-27 MED ORDER — ENOXAPARIN SODIUM 40 MG/0.4ML ~~LOC~~ SOLN: 40.0000 mg | SUBCUTANEOUS | Status: DC

## 2016-01-27 MED ORDER — DEXTROSE 5 % IV SOLN
1.5000 g | INTRAVENOUS | Status: AC
  Administered 2016-01-27: 1.5 g via INTRAVENOUS
  Filled 2016-01-27: qty 1.5

## 2016-01-27 MED ORDER — SUGAMMADEX SODIUM 200 MG/2ML IV SOLN
INTRAVENOUS | Status: DC | PRN
  Administered 2016-01-27: 150 mg via INTRAVENOUS

## 2016-01-27 MED ORDER — THROMBIN 20000 UNITS EX SOLR
CUTANEOUS | Status: AC
  Filled 2016-01-27: qty 20000

## 2016-01-27 MED ORDER — SODIUM CHLORIDE 0.9 % IV SOLN
INTRAVENOUS | Status: DC | PRN
  Administered 2016-01-27: 08:00:00

## 2016-01-27 MED ORDER — METOPROLOL TARTRATE 25 MG PO TABS
25.0000 mg | ORAL_TABLET | Freq: Two times a day (BID) | ORAL | Status: DC
  Administered 2016-01-27 – 2016-01-28 (×2): 25 mg via ORAL
  Filled 2016-01-27 (×2): qty 1

## 2016-01-27 MED ORDER — DEXAMETHASONE SODIUM PHOSPHATE 10 MG/ML IJ SOLN
INTRAMUSCULAR | Status: DC | PRN
  Administered 2016-01-27: 5 mg via INTRAVENOUS

## 2016-01-27 MED ORDER — CLOPIDOGREL BISULFATE 75 MG PO TABS
75.0000 mg | ORAL_TABLET | Freq: Every day | ORAL | Status: DC
  Administered 2016-01-28: 75 mg via ORAL
  Filled 2016-01-27: qty 1

## 2016-01-27 MED ORDER — PROPOFOL 10 MG/ML IV BOLUS
INTRAVENOUS | Status: DC | PRN
  Administered 2016-01-27: 150 mg via INTRAVENOUS

## 2016-01-27 MED ORDER — HEPARIN SODIUM (PORCINE) 1000 UNIT/ML IJ SOLN
INTRAMUSCULAR | Status: AC
  Filled 2016-01-27: qty 1

## 2016-01-27 MED ORDER — PROTAMINE SULFATE 10 MG/ML IV SOLN
INTRAVENOUS | Status: AC
  Filled 2016-01-27: qty 5

## 2016-01-27 MED ORDER — DEXTROSE 5 % IV SOLN
1.5000 g | Freq: Two times a day (BID) | INTRAVENOUS | Status: AC
  Administered 2016-01-27 – 2016-01-28 (×2): 1.5 g via INTRAVENOUS
  Filled 2016-01-27 (×2): qty 1.5

## 2016-01-27 MED ORDER — CLINDAMYCIN HCL 150 MG PO CAPS
150.0000 mg | ORAL_CAPSULE | Freq: Three times a day (TID) | ORAL | Status: DC
  Administered 2016-01-27 – 2016-01-28 (×2): 150 mg via ORAL
  Filled 2016-01-27 (×3): qty 1

## 2016-01-27 MED ORDER — GUAIFENESIN-DM 100-10 MG/5ML PO SYRP: 15.0000 mL | ORAL_SOLUTION | ORAL | Status: DC | PRN

## 2016-01-27 MED ORDER — POTASSIUM CHLORIDE CRYS ER 20 MEQ PO TBCR: 20.0000 meq | EXTENDED_RELEASE_TABLET | Freq: Every day | ORAL | Status: DC | PRN

## 2016-01-27 MED ORDER — MUPIROCIN 2 % EX OINT
1.0000 "application " | TOPICAL_OINTMENT | Freq: Three times a day (TID) | CUTANEOUS | Status: DC
  Filled 2016-01-27: qty 22

## 2016-01-27 MED ORDER — DOCUSATE SODIUM 100 MG PO CAPS
100.0000 mg | ORAL_CAPSULE | Freq: Every day | ORAL | Status: DC
  Filled 2016-01-27: qty 1

## 2016-01-27 MED ORDER — DEXAMETHASONE SODIUM PHOSPHATE 10 MG/ML IJ SOLN
INTRAMUSCULAR | Status: AC
  Filled 2016-01-27: qty 1

## 2016-01-27 MED ORDER — LACTATED RINGERS IV SOLN
INTRAVENOUS | Status: DC | PRN
  Administered 2016-01-27: 07:00:00 via INTRAVENOUS

## 2016-01-27 MED ORDER — ONDANSETRON HCL 4 MG/2ML IJ SOLN
INTRAMUSCULAR | Status: DC | PRN
  Administered 2016-01-27: 4 mg via INTRAVENOUS

## 2016-01-27 MED ORDER — LIDOCAINE HCL (PF) 1 % IJ SOLN
INTRAMUSCULAR | Status: AC
  Filled 2016-01-27: qty 30

## 2016-01-27 MED ORDER — LISINOPRIL 40 MG PO TABS
80.0000 mg | ORAL_TABLET | Freq: Every day | ORAL | Status: DC
  Administered 2016-01-28: 80 mg via ORAL
  Filled 2016-01-27: qty 2

## 2016-01-27 MED ORDER — ASPIRIN 325 MG PO TABS
325.0000 mg | ORAL_TABLET | Freq: Every day | ORAL | Status: DC
  Administered 2016-01-28: 325 mg via ORAL
  Filled 2016-01-27: qty 1

## 2016-01-27 MED ORDER — NITROGLYCERIN 0.2 MG/ML ON CALL CATH LAB
INTRAVENOUS | Status: DC | PRN
  Administered 2016-01-27: 40 ug via INTRAVENOUS
  Administered 2016-01-27 (×2): 80 ug via INTRAVENOUS
  Administered 2016-01-27: 40 ug via INTRAVENOUS
  Administered 2016-01-27: 80 ug via INTRAVENOUS

## 2016-01-27 MED ORDER — ONDANSETRON HCL 4 MG/2ML IJ SOLN
INTRAMUSCULAR | Status: AC
  Filled 2016-01-27: qty 2

## 2016-01-27 MED ORDER — FENTANYL CITRATE (PF) 100 MCG/2ML IJ SOLN
INTRAMUSCULAR | Status: DC | PRN
  Administered 2016-01-27 (×3): 50 ug via INTRAVENOUS

## 2016-01-27 MED ORDER — LIDOCAINE-EPINEPHRINE (PF) 1 %-1:200000 IJ SOLN
INTRAMUSCULAR | Status: DC | PRN
  Administered 2016-01-27: 10 mL

## 2016-01-27 MED ORDER — HYDRALAZINE HCL 20 MG/ML IJ SOLN: 5.0000 mg | INTRAMUSCULAR | Status: DC | PRN

## 2016-01-27 MED ORDER — SODIUM CHLORIDE 0.9 % IV SOLN: INTRAVENOUS | Status: DC

## 2016-01-27 MED ORDER — METOPROLOL TARTRATE 5 MG/5ML IV SOLN: 2.0000 mg | INTRAVENOUS | Status: DC | PRN

## 2016-01-27 MED ORDER — MAGNESIUM SULFATE 2 GM/50ML IV SOLN: 2.0000 g | Freq: Every day | INTRAVENOUS | Status: DC | PRN

## 2016-01-27 MED ORDER — HEPARIN SODIUM (PORCINE) 1000 UNIT/ML IJ SOLN
INTRAMUSCULAR | Status: DC | PRN
  Administered 2016-01-27: 7000 [IU] via INTRAVENOUS
  Administered 2016-01-27: 2000 [IU] via INTRAVENOUS

## 2016-01-27 MED ORDER — FENTANYL CITRATE (PF) 100 MCG/2ML IJ SOLN
INTRAMUSCULAR | Status: AC
  Filled 2016-01-27: qty 4

## 2016-01-27 MED ORDER — OXYCODONE HCL 5 MG/5ML PO SOLN: 5.0000 mg | Freq: Once | ORAL | Status: AC | PRN

## 2016-01-27 MED ORDER — EPHEDRINE SULFATE 50 MG/ML IJ SOLN
INTRAMUSCULAR | Status: DC | PRN
  Administered 2016-01-27 (×2): 10 mg via INTRAVENOUS

## 2016-01-27 MED ORDER — PROTAMINE SULFATE 10 MG/ML IV SOLN
INTRAVENOUS | Status: DC | PRN
  Administered 2016-01-27: 40 mg via INTRAVENOUS

## 2016-01-27 MED ORDER — AMLODIPINE BESYLATE 10 MG PO TABS
10.0000 mg | ORAL_TABLET | Freq: Every day | ORAL | Status: DC
  Administered 2016-01-28: 10 mg via ORAL
  Filled 2016-01-27: qty 1

## 2016-01-27 MED ORDER — ACETAMINOPHEN 325 MG PO TABS: 325.0000 mg | ORAL_TABLET | ORAL | Status: DC | PRN

## 2016-01-27 MED ORDER — SODIUM CHLORIDE 0.9 % IV SOLN: 500.0000 mL | Freq: Once | INTRAVENOUS | Status: DC | PRN

## 2016-01-27 MED ORDER — PANTOPRAZOLE SODIUM 40 MG PO TBEC
40.0000 mg | DELAYED_RELEASE_TABLET | Freq: Every day | ORAL | Status: DC
Start: 2016-01-28 — End: 2016-01-28
  Administered 2016-01-28: 40 mg via ORAL
  Filled 2016-01-27: qty 1

## 2016-01-27 MED ORDER — ACETAMINOPHEN 325 MG RE SUPP: 325.0000 mg | RECTAL | Status: DC | PRN

## 2016-01-27 MED ORDER — PHENOL 1.4 % MT LIQD: 1.0000 | OROMUCOSAL | Status: DC | PRN

## 2016-01-27 MED ORDER — 0.9 % SODIUM CHLORIDE (POUR BTL) OPTIME
TOPICAL | Status: DC | PRN
  Administered 2016-01-27: 2000 mL

## 2016-01-27 MED ORDER — OXYCODONE HCL 5 MG PO TABS
ORAL_TABLET | ORAL | Status: AC
  Filled 2016-01-27: qty 1

## 2016-01-27 MED ORDER — ATORVASTATIN CALCIUM 40 MG PO TABS
40.0000 mg | ORAL_TABLET | Freq: Every day | ORAL | Status: DC
  Filled 2016-01-27: qty 1

## 2016-01-27 MED ORDER — OXYCODONE-ACETAMINOPHEN 5-325 MG PO TABS
1.0000 | ORAL_TABLET | ORAL | Status: DC | PRN
  Administered 2016-01-27 – 2016-01-28 (×2): 1 via ORAL
  Filled 2016-01-27 (×2): qty 1

## 2016-01-27 MED ORDER — PHENYLEPHRINE 40 MCG/ML (10ML) SYRINGE FOR IV PUSH (FOR BLOOD PRESSURE SUPPORT)
PREFILLED_SYRINGE | INTRAVENOUS | Status: AC
  Filled 2016-01-27: qty 10

## 2016-01-27 MED ORDER — ONDANSETRON HCL 4 MG/2ML IJ SOLN: 4.0000 mg | Freq: Four times a day (QID) | INTRAMUSCULAR | Status: DC | PRN

## 2016-01-27 MED ORDER — LIDOCAINE-EPINEPHRINE (PF) 1 %-1:200000 IJ SOLN
INTRAMUSCULAR | Status: AC
  Filled 2016-01-27: qty 30

## 2016-01-27 MED ORDER — DEXTRAN 40 IN SALINE 10-0.9 % IV SOLN
INTRAVENOUS | Status: AC
  Filled 2016-01-27: qty 500

## 2016-01-27 MED ORDER — HYDROCHLOROTHIAZIDE 25 MG PO TABS
25.0000 mg | ORAL_TABLET | Freq: Every day | ORAL | Status: DC
  Administered 2016-01-28: 25 mg via ORAL
  Filled 2016-01-27: qty 1

## 2016-01-27 MED ORDER — MORPHINE SULFATE (PF) 2 MG/ML IV SOLN
2.0000 mg | INTRAVENOUS | Status: DC | PRN
  Administered 2016-01-27: 2 mg via INTRAVENOUS
  Filled 2016-01-27: qty 1

## 2016-01-27 MED ORDER — ALUM & MAG HYDROXIDE-SIMETH 200-200-20 MG/5ML PO SUSP
15.0000 mL | ORAL | Status: DC | PRN
Start: 2016-01-27 — End: 2016-01-28

## 2016-01-27 SURGICAL SUPPLY — 48 items
BAG DECANTER FOR FLEXI CONT (MISCELLANEOUS) ×3 IMPLANT
CANISTER SUCTION 2500CC (MISCELLANEOUS) ×3 IMPLANT
CANNULA VESSEL 3MM 2 BLNT TIP (CANNULA) ×9 IMPLANT
CATH ROBINSON RED A/P 18FR (CATHETERS) ×3 IMPLANT
CLIP TI MEDIUM 24 (CLIP) ×3 IMPLANT
CLIP TI WIDE RED SMALL 24 (CLIP) ×3 IMPLANT
CRADLE DONUT ADULT HEAD (MISCELLANEOUS) ×3 IMPLANT
DERMABOND ADVANCED (GAUZE/BANDAGES/DRESSINGS) ×2
DERMABOND ADVANCED .7 DNX12 (GAUZE/BANDAGES/DRESSINGS) ×1 IMPLANT
DRAIN CHANNEL 15F RND FF W/TCR (WOUND CARE) IMPLANT
ELECT REM PT RETURN 9FT ADLT (ELECTROSURGICAL) ×3
ELECTRODE REM PT RTRN 9FT ADLT (ELECTROSURGICAL) ×1 IMPLANT
EVACUATOR SILICONE 100CC (DRAIN) IMPLANT
GEL ULTRASOUND 20GR AQUASONIC (MISCELLANEOUS) ×3 IMPLANT
GLOVE BIO SURGEON STRL SZ 6.5 (GLOVE) ×2 IMPLANT
GLOVE BIO SURGEON STRL SZ7.5 (GLOVE) ×3 IMPLANT
GLOVE BIO SURGEONS STRL SZ 6.5 (GLOVE) ×1
GLOVE BIOGEL PI IND STRL 6.5 (GLOVE) ×5 IMPLANT
GLOVE BIOGEL PI IND STRL 7.0 (GLOVE) ×3 IMPLANT
GLOVE BIOGEL PI IND STRL 8 (GLOVE) ×1 IMPLANT
GLOVE BIOGEL PI INDICATOR 6.5 (GLOVE) ×10
GLOVE BIOGEL PI INDICATOR 7.0 (GLOVE) ×6
GLOVE BIOGEL PI INDICATOR 8 (GLOVE) ×2
GLOVE ECLIPSE 6.5 STRL STRAW (GLOVE) ×3 IMPLANT
GLOVE SURG SS PI 6.5 STRL IVOR (GLOVE) ×12 IMPLANT
GOWN STRL REUS W/ TWL LRG LVL3 (GOWN DISPOSABLE) ×6 IMPLANT
GOWN STRL REUS W/TWL LRG LVL3 (GOWN DISPOSABLE) ×12
KIT BASIN OR (CUSTOM PROCEDURE TRAY) ×3 IMPLANT
KIT CATH SUCT 8FR (CATHETERS) ×3 IMPLANT
KIT ROOM TURNOVER OR (KITS) ×3 IMPLANT
NEEDLE HYPO 25X1 1.5 SAFETY (NEEDLE) ×3 IMPLANT
NS IRRIG 1000ML POUR BTL (IV SOLUTION) ×6 IMPLANT
PACK CAROTID (CUSTOM PROCEDURE TRAY) ×3 IMPLANT
PAD ARMBOARD 7.5X6 YLW CONV (MISCELLANEOUS) ×6 IMPLANT
PATCH HEMASHIELD 8X150 (Vascular Products) ×3 IMPLANT
SHUNT CAROTID BYPASS 10 (VASCULAR PRODUCTS) ×3 IMPLANT
SHUNT CAROTID BYPASS 12FRX15.5 (VASCULAR PRODUCTS) IMPLANT
SPONGE INTESTINAL PEANUT (DISPOSABLE) ×3 IMPLANT
SPONGE SURGIFOAM ABS GEL 100 (HEMOSTASIS) ×3 IMPLANT
SUT PROLENE 6 0 BV (SUTURE) ×9 IMPLANT
SUT PROLENE 7 0 BV 1 (SUTURE) IMPLANT
SUT SILK 2 0 FS (SUTURE) IMPLANT
SUT VIC AB 3-0 SH 27 (SUTURE) ×2
SUT VIC AB 3-0 SH 27X BRD (SUTURE) ×1 IMPLANT
SUT VICRYL 4-0 PS2 18IN ABS (SUTURE) ×3 IMPLANT
SYR CONTROL 10ML LL (SYRINGE) ×3 IMPLANT
TRAY CATH 16FR W/PLASTIC CATH (SET/KITS/TRAYS/PACK) ×3 IMPLANT
WATER STERILE IRR 1000ML POUR (IV SOLUTION) ×3 IMPLANT

## 2016-01-27 NOTE — Progress Notes (Signed)
  [facility] Day of Surgery Note  Subjective:  Patient seen in PACU. Complaining of right incisional pain.   Vitals:   01/27/16 1030 01/27/16 1100  BP: (!) 128/59 (!) 129/58  Pulse: 85 81  Resp: 17 18  Temp:     Right neck incision without hematoma Left facial droop that is residual from recent CVA Tongue without deviation LUE 4/5 strength which is residual from CVA 5/5 strength remaining extremities   Assessment/Plan:  This is a 75 y.o. female who is s/p right carotid endarterectomy  Stable post-op Neuro exam intact. No new neurological deficits.    Virgina Jock, [place] Pager: [phone] 01/27/2016 11:50 AM

## 2016-01-27 NOTE — Transfer of Care (Signed)
Immediate Anesthesia Transfer of Care Note  Patient: ELLIYANNA TORIZ  Procedure(s) Performed: Procedure(s): ENDARTERECTOMY CAROTID (Right) PATCH ANGIOPLASTY USING HEMASHIELD PLATINUM FINESSE (Right)  Patient Location: PACU  Anesthesia Type:General  Level of Consciousness: awake, alert  and oriented  Airway & Oxygen Therapy: Patient Spontanous Breathing and Patient connected to nasal cannula oxygen  Post-op Assessment: Report given to RN, Post -op Vital signs reviewed and stable, Patient moving all extremities X 4 and Patient able to stick tongue midline  Post vital signs: Reviewed and stable  Last Vitals:  Vitals:   01/27/16 0604 01/27/16 1026  BP: (!) 171/74 (!) (P) 128/59  Pulse: 88   Resp: 20 (P) 16  Temp: 36.7 C (P) 36.4 C    Last Pain:  Vitals:   01/27/16 0609  TempSrc:   PainSc: 0-No pain         Complications: No apparent anesthesia complications

## 2016-01-27 NOTE — Op Note (Signed)
NAME: Melinda Eaton   MRN: [id number] DOB: 1940-10-12    DATE OF OPERATION: 01/27/2016  PREOP DIAGNOSIS: Symptomatic right carotid stenosis  POSTOP DIAGNOSIS: Same  PROCEDURE: Right carotid endarterectomy with Dacron patch angioplasty  SURGEON: Judeth Cornfield. Scot Dock, MD, FACS  ASSIST: Silva Bandy, [facility]  ANESTHESIA: Gen.   EBL: Minimal  INDICATIONS: Melinda Eaton is a 75 y.o. female who had a right brain stroke. She was found have a greater than 70% right carotid stenosis. She had some left upper extremity symptoms and also some difficulty swallowing preoperatively. Right carotid endarterectomy was recommended in order to lower her risk of future stroke.  FINDINGS: The plaque extended very high up the internal carotid artery to the base of the skull. I had to dissected well above the hypoglossal nerve, divide the occipital artery, and partially divide the digastric muscle in order to allow adequate exposure of the distal internal carotid artery.  TECHNIQUE: The patient was taken to the operative room after an arterial line was placed by anesthesia. She received a general anesthetic. After careful positioning the right neck was prepped and draped in usual sterile fashion. An incision was made along the anterior border of the sternocleidomastoid and the dissection carried down to the common carotid artery which was dissected free and controlled with a Rummel tourniquet. The facial vein was divided between 2-0 silk ties and the patient had a high bifurcation. I had to fully mobilize the hypoglossal nerve by dividing the ansa cervicalis. In order to expose the artery above the plaque, which extended high up the internal carotid artery, I had to pull down on the hypoglossal nerve and dissected above this. The crossing occipital artery was divided between 2-0 silk ties. It was still not adequate exposure of the distal internal carotid artery and I could not get above the plaque. I therefore  partially divided the digastric muscle and then was able to get a clamp above the plaque in the internal carotid artery. The patient had been heparinized with 7000 units of IV heparin. Next the superior thyroid arteries and external carotid artery was controlled. A clamp was then placed on the internal carotid artery above the plaque. The common carotid artery and external carotid arteries were clamped. A longitudinal arteriotomy was made in the common carotid artery and this was extended through the plaque into the internal carotid artery above the plaque.  32 French feeding tube was cut to appropriate length and then advanced into the internal carotid artery, backbled and then placed into the common carotid artery and secured with Rummel tourniquet. I checked flow through the shunt with Doppler and there was good flow. An endarterectomy plane was established proximally and the plaque was sharply divided. Eversion endarterectomy was then performed of the external carotid artery. Distally the plaque extended very high but I was able to get the plaque out in its entirety. There was a nice taper distally. No tacking sutures were required. The artery was irrigated with copious amounts of heparin and dextran and all loose debris removed.  Next a long Dacron patch was selected. This was sewn with a continuous 6-0 Prolene suture. The proximal and was done with the patch above the hypoglossal nerve. Once this was completed the patch was brought underneath the nerve and the anastomosis was continued. A second 6-0 Prolene was started at the heel. Prior to completing the patch closure, the shunt was removed. The artery was backbled and flushed appropriately and the anastomosis completed. Flow  was reestablished first to the external carotid artery and into the internal carotid artery. At the completion was a good Doppler signal with good diastolic flow distal to the patch. Hemostasis was obtained in the wound. The heparin  was partially reversed with protamine.  The wound was closed with a deep layer of 3-0 Vicryl, the platysma was closed with running 3-0 Vicryl, the skin was closed with 4-0 Vicryl. Liquid band was applied. The patient awoke neurologically intact. All needle and sponge counts were correct. The patient was transferred to the recovery room in stable condition.  Deitra Mayo, MD, FACS [facility]  DATE OF DICTATION:   01/27/2016

## 2016-01-27 NOTE — Addendum Note (Signed)
Addendum  created 01/27/16 1229 by Inda Coke, CRNA   Anesthesia Event edited

## 2016-01-27 NOTE — H&P (Signed)
Patient name: Melinda Eaton     MRN: [id number]        DOB: 08/28/1940          Sex: female  REASON FOR ADMISSION: Carotid disease. Referred by Dr. Jimmye Norman from [facility] family practice.  HPI: Melinda Eaton is a 75 y.o. female, who was admitted at an outlying hospital on October 16 with a right brain stroke. She had developed left upper extremity weakness, facial droop on the left and slurred speech 2 days prior. She was worked up and found to have a right brain stroke by MRI. MRI also suggested 70% right carotid stenosis. She is sent for vascular consultation.  She is right-handed. She denies any previous history of stroke, TIAs, expressive or receptive aphasia, or amaurosis fugax. She is on aspirin and a statin. Plavix was added after her stroke.  The patient does admit to occasional episodes of chest pressure or "chest aching." She admits to some dyspnea on exertion.  I have reviewed the records that were sent with the patient. The patient was admitted to an outlying hospital on 12/08/2015 with a stroke. She had a 70% right carotid stenosis. This was by MRI. Patient was on aspirin and a statin in addition to Plavix. The patient also has a history of hypertension.      Past Medical History:  Diagnosis Date  . Anemia   . GERD (gastroesophageal reflux disease)   . Hypertension   . Macular degeneration   . Stroke [facility])           Family History  Problem Relation Age of Onset  . Cancer Father     prostate  . Cancer Sister     SOCIAL HISTORY: Social History        Social History  . Marital status: Divorced    Spouse name: N/A  . Number of children: N/A  . Years of education: N/A      Occupational History  . Not on file.        Social History Main Topics  . Smoking status: Current Every Day Smoker    Packs/day: 0.50    Years: 50.00    Types: Cigarettes  . Smokeless tobacco: Never Used  . Alcohol use No  . Drug use: No  .  Sexual activity: Not on file       Other Topics Concern  . Not on file      Social History Narrative  . No narrative on file         Allergies  Allergen Reactions  . Bactrim [Sulfamethoxazole-Trimethoprim] Other (See Comments)    hepatitis  . Doxycycline     Headache          Current Outpatient Prescriptions  Medication Sig Dispense Refill  . amLODipine (NORVASC) 10 MG tablet     . aspirin 81 MG tablet Take 81 mg by mouth daily.    Marland Kitchen atorvastatin (LIPITOR) 40 MG tablet Take 40 mg by mouth.    . clindamycin (CLEOCIN) 150 MG capsule Take 1 capsule (150 mg total) by mouth daily. 30 capsule 3  . clopidogrel (PLAVIX) 75 MG tablet Take 75 mg by mouth.    . hydrochlorothiazide (MICROZIDE) 12.5 MG capsule Take 12.5 mg by mouth daily.    . lansoprazole (PREVACID) 15 MG capsule Take 15 mg by mouth daily at 12 noon.    . Multiple Vitamins-Minerals (PRESERVISION/LUTEIN) CAPS Take 1 capsule by mouth daily.    . quinapril (ACCUPRIL) 20  MG tablet Take 20 mg by mouth at bedtime.    . clindamycin (CLEOCIN) 150 MG capsule Take 150 mg by mouth 3 (three) times daily. Take one tablet three times daily for 7 days (starting 10-24-13) , then one daily .    Marland Kitchen mupirocin ointment (BACTROBAN) 2 % Place 1 application into the nose 2 (two) times daily.    Marland Kitchen omeprazole (PRILOSEC) 20 MG capsule Take 20 mg by mouth daily.     No current facility-administered medications for this visit.     REVIEW OF SYSTEMS:  [X]  denotes positive finding, [ ]  denotes negative finding Cardiac  Comments:  Chest pain or chest pressure: X   Shortness of breath upon exertion: X   Short of breath when lying flat:    Irregular heart rhythm:        Vascular    Pain in calf, thigh, or hip brought on by ambulation: X   Pain in feet at night that wakes you up from your sleep:     Blood clot in your veins:    Leg swelling:         Pulmonary    Oxygen at home:      Productive cough:  X   Wheezing:         Neurologic    Sudden weakness in arms or legs:     Sudden numbness in arms or legs:     Sudden onset of difficulty speaking or slurred speech: X   Temporary loss of vision in one eye:  X   Problems with dizziness:         Gastrointestinal    Blood in stool:     Vomited blood:         Genitourinary    Burning when urinating:     Blood in urine:        Psychiatric    Major depression:         Hematologic    Bleeding problems:    Problems with blood clotting too easily:        Skin    Rashes or ulcers:        Constitutional    Fever or chills: X     PHYSICAL EXAM:     Vitals:   12/25/15 0950 12/25/15 0951  BP: (!) 150/77 (!) 142/83  Pulse: 84   Resp: 18   Temp: 97.2 F (36.2 C)   TempSrc: Oral   SpO2: 98%   Weight: 157 lb 14.4 oz (71.6 kg)   Height: 5' 3.75" (1.619 m)     GENERAL: The patient is a well-nourished female, in no acute distress. The vital signs are documented above. CARDIAC: There is a regular rate and rhythm.  VASCULAR: She has a right carotid bruit. Both feet are warm and well perfused. She has no significant lower extremity swelling. PULMONARY: There is good air exchange bilaterally without wheezing or rales. ABDOMEN: Soft and non-tender with normal pitched bowel sounds.  MUSCULOSKELETAL: There are no major deformities or cyanosis. NEUROLOGIC: No focal weakness or paresthesias are detected. SKIN: There are no ulcers or rashes noted. PSYCHIATRIC: The patient has a normal affect.  DATA:   CAROTID DUPLEX: I have independent reinterpreted her carotid duplex scan today.  On the right side there is a 60-79% right carotid stenosis. This is noted in the proximal right internal carotid artery.  On the left side there is a 40-59% left carotid stenosis.  MEDICAL ISSUES:  SYMPTOMATIC RIGHT CAROTID STENOSIS:  This patient has a  symptom attic 70% right carotid stenosis. I have recommended right carotid endarterectomy in order to lower her risk of future stroke. Her risk of stroke without surgery is 10% in the next year and then 6% per year thereafter.I have reviewed the indications for carotid endarterectomy, that is to lower the risk of future stroke. I have also reviewed the potential complications of surgery, including but not limited to: bleeding, stroke (perioperative risk 1-2%), MI, nerve injury of other unpredictable medical problems. All of the patients questions were answered and they are agreeable to proceed with surgery.   She does admit to some occasional chest aching and chest pressure and for this reason we will obtain preoperative cardiac evaluation before scheduling her surgery. I did discuss the case today with [facility] and they would like [place] to arrange for cardiac clearance. Once we have a date for surgery we will need to hold her Plavix 5 days preoperatively and have her continue her aspirin. She is on a statin.    Deitra Mayo [facility] [place] [phone]      Electronically signed by Angelia Mould, MD at 12/25/2015 10:25 AM      Office Visit on 12/25/2015        Routing History

## 2016-01-27 NOTE — Anesthesia Preprocedure Evaluation (Addendum)
Anesthesia Evaluation  Patient identified by MRN, date of birth, ID band Patient awake    Reviewed: Allergy & Precautions, NPO status , Patient's Chart, lab work & pertinent test results  Airway Mallampati: II  TM Distance: >3 FB Neck ROM: Full    Dental  (+) Edentulous Upper, Edentulous Lower   Pulmonary Current Smoker,    breath sounds clear to auscultation       Cardiovascular hypertension,  Rhythm:Regular Rate:Normal     Neuro/Psych    GI/Hepatic   Endo/Other    Renal/GU      Musculoskeletal   Abdominal   Peds  Hematology   Anesthesia Other Findings   Reproductive/Obstetrics                            Anesthesia Physical Anesthesia Plan  ASA: III  Anesthesia Plan: General   Post-op Pain Management:    Induction: Intravenous  Airway Management Planned: Oral ETT  Additional Equipment: Arterial line  Intra-op Plan:   Post-operative Plan: Extubation in OR  Informed Consent: I have reviewed the patients History and Physical, chart, labs and discussed the procedure including the risks, benefits and alternatives for the proposed anesthesia with the patient or authorized representative who has indicated his/her understanding and acceptance.     Plan Discussed with: CRNA and Anesthesiologist  Anesthesia Plan Comments:         Anesthesia Quick Evaluation

## 2016-01-27 NOTE — Progress Notes (Signed)
Report given to steve shaw rn as caregiver 

## 2016-01-27 NOTE — Anesthesia Postprocedure Evaluation (Signed)
Anesthesia Post Note  Patient: MACIL STROMGREN  Procedure(s) Performed: Procedure(s) (LRB): ENDARTERECTOMY CAROTID (Right) PATCH ANGIOPLASTY USING HEMASHIELD PLATINUM FINESSE (Right)  Patient location during evaluation: PACU Anesthesia Type: General Level of consciousness: awake, awake and alert and oriented Pain management: pain level controlled Vital Signs Assessment: post-procedure vital signs reviewed and stable Respiratory status: spontaneous breathing, nonlabored ventilation and respiratory function stable Cardiovascular status: blood pressure returned to baseline Anesthetic complications: no    Last Vitals:  Vitals:   01/27/16 1030 01/27/16 1100  BP: (!) 128/59 (!) 129/58  Pulse: 85 81  Resp: 17 18  Temp:      Last Pain:  Vitals:   01/27/16 1100  TempSrc:   PainSc: 0-No pain                 Terrill Alperin COKER

## 2016-01-27 NOTE — Anesthesia Procedure Notes (Signed)
Procedure Name: Intubation Date/Time: 01/27/2016 7:51 AM Performed by: Rejeana Brock L Pre-anesthesia Checklist: Patient identified, Emergency Drugs available, Suction available and Patient being monitored Patient Re-evaluated:Patient Re-evaluated prior to inductionOxygen Delivery Method: Circle System Utilized Preoxygenation: Pre-oxygenation with 100% oxygen Intubation Type: IV induction Ventilation: Mask ventilation without difficulty Laryngoscope Size: Mac and 3 Grade View: Grade I Tube type: Oral Tube size: 7.0 mm Number of attempts: 1 Airway Equipment and Method: Stylet,  Oral airway and LTA kit utilized Placement Confirmation: ETT inserted through vocal cords under direct vision,  positive ETCO2 and breath sounds checked- equal and bilateral Secured at: 21 cm Tube secured with: Tape Dental Injury: Teeth and Oropharynx as per pre-operative assessment

## 2016-01-28 ENCOUNTER — Telehealth: Payer: Self-pay | Admitting: Vascular Surgery

## 2016-01-28 ENCOUNTER — Encounter (HOSPITAL_COMMUNITY): Payer: Self-pay | Admitting: Vascular Surgery

## 2016-01-28 LAB — CBC
HCT: 35.4 % — ABNORMAL LOW (ref 36.0–46.0)
Hemoglobin: 12.1 g/dL (ref 12.0–15.0)
MCH: 29.4 pg (ref 26.0–34.0)
MCHC: 34.2 g/dL (ref 30.0–36.0)
MCV: 85.9 fL (ref 78.0–100.0)
PLATELETS: 188 10*3/uL (ref 150–400)
RBC: 4.12 MIL/uL (ref 3.87–5.11)
RDW: 14.1 % (ref 11.5–15.5)
WBC: 10.7 10*3/uL — AB (ref 4.0–10.5)

## 2016-01-28 LAB — BASIC METABOLIC PANEL
ANION GAP: 10 (ref 5–15)
BUN: 8 mg/dL (ref 6–20)
CALCIUM: 9 mg/dL (ref 8.9–10.3)
CO2: 25 mmol/L (ref 22–32)
Chloride: 102 mmol/L (ref 101–111)
Creatinine, Ser: 0.63 mg/dL (ref 0.44–1.00)
GFR calc Af Amer: >60 mL/min (ref >60)
GLUCOSE: 111 mg/dL — AB (ref 65–99)
Potassium: 3.8 mmol/L (ref 3.5–5.1)
SODIUM: 137 mmol/L (ref 135–145)

## 2016-01-28 MED ORDER — OXYCODONE-ACETAMINOPHEN 5-325 MG PO TABS: 1.0000 | ORAL_TABLET | Freq: Four times a day (QID) | ORAL | 0 refills | Status: DC | PRN

## 2016-01-28 MED ORDER — CLINDAMYCIN HCL 150 MG PO CAPS: 150.0000 mg | ORAL_CAPSULE | Freq: Three times a day (TID) | ORAL | Status: DC

## 2016-01-28 NOTE — Progress Notes (Signed)
   VASCULAR SURGERY ASSESSMENT & PLAN:  1 Day Post-Op s/p: Right carotid endarterectomy.  For discharge today.  She is on aspirin and is on a statin.  SUBJECTIVE: No complaints. No problems swallowing.  PHYSICAL EXAM: Vitals:   01/27/16 1549 01/27/16 1903 01/27/16 2220 01/28/16 0324  BP:  (!) 141/61 (!) 156/61 (!) 158/73  Pulse: 91 89 92 81  Resp: 18 20 (!) 25 18  Temp:  98.4 F (36.9 C) 98.6 F (37 C) 97.9 F (36.6 C)  TempSrc:  Oral Oral Oral  SpO2: 94% 93% 91% 92%  Weight:      Height:       Right neck incision looks fine. Tongue is midline. No focal weakness or paresthesias.  LABS: Lab Results  Component Value Date   WBC 10.7 (H) 01/28/2016   HGB 12.1 01/28/2016   HCT 35.4 (L) 01/28/2016   MCV 85.9 01/28/2016   PLT 188 01/28/2016   Lab Results  Component Value Date   CREATININE 0.63 01/28/2016   Lab Results  Component Value Date   INR 1.01 01/21/2016   Active Problems:   Symptomatic stenosis of right carotid artery  Gae Gallop Beeper: [id number] 01/28/2016

## 2016-01-28 NOTE — Telephone Encounter (Signed)
Sched appt 02/18/16 at 3:00. Lm on hm# to inform pt of appt.

## 2016-01-28 NOTE — Progress Notes (Signed)
Patient discharged with volunteer services by wheelchair.

## 2016-01-28 NOTE — Progress Notes (Signed)
Patient discharge teaching done at bedside with son and daughter in law present. Went over when to call the Md, when to call 911, incision care, driving and lifting restrictions, and medications and when to take them. Patient and family was eager to learn. Basil Dess, RN

## 2016-01-28 NOTE — Telephone Encounter (Signed)
-----   Message from Denman George, RN sent at 01/27/2016 10:53 AM EST ----- Regarding: needs 2-3 wk. f/u with Dr. Scot Dock; s/p right CEA   ----- Message ----- From: Alvia Grove, PA-C Sent: 01/27/2016  10:04 AM To: Vvs Charge Pool  S/p right carotid endarterectomy 01/27/16  F/u with Dr. Scot Dock in 2-3 weeks  Thanks Maudie Mercury

## 2016-01-28 NOTE — Discharge Summary (Signed)
Discharge Summary     Melinda Eaton 03-21-1940 75 y.o. female  [id number]  Admission Date: 01/27/2016  Discharge Date: 01/28/16  Physician: Angelia Mould, MD  Admission Diagnosis: Right carotid artery stenosis I65.21   HPI:   This is a 75 y.o. female who was admitted at an outlying hospital on October 16 with a right brain stroke. She had developed left upper extremity weakness, facial droop on the left and slurred speech 2 days prior. She was worked up and found to have a right brain stroke by MRI. MRI also suggested 70% right carotid stenosis. She is sent for vascular consultation.  She is right-handed. She denies any previous history of stroke, TIAs, expressive or receptive aphasia, or amaurosis fugax. She is on aspirin and a statin. Plavix was added after her stroke.  The patient does admit to occasional episodes of chest pressure or "chest aching." She admits to some dyspnea on exertion.  I have reviewed the records that were sent with the patient. The patient was admitted to an outlying hospital on 12/08/2015 with a stroke. She had a 70% right carotid stenosis. This was by MRI. Patient was on aspirin and a statin in addition to Plavix. The patient also has a history of hypertension.  Hospital Course:  The patient was admitted to the hospital and taken to the operating room on 01/27/2016 and underwent right carotid endarterectomy.  The pt tolerated the procedure well and was transported to the PACU in good condition.   By POD 1, the pt neuro status was in tact without any new weakness.   The remainder of the hospital course consisted of increasing mobilization and increasing intake of solids without difficulty.    Recent Labs  01/28/16 0430  NA 137  K 3.8  CL 102  CO2 25  GLUCOSE 111*  BUN 8  CALCIUM 9.0    Recent Labs  01/27/16 1644 01/28/16 0430  WBC 10.2 10.7*  HGB 12.1 12.1  HCT 35.4* 35.4*  PLT 179 188   No results for input(s): INR  in the last 72 hours.   Discharge Instructions    CAROTID Sugery: Call MD for difficulty swallowing or speaking; weakness in arms or legs that is a new symtom; severe headache.  If you have increased swelling in the neck and/or  are having difficulty breathing, CALL 911    Complete by:  As directed    Call MD for:  redness, tenderness, or signs of infection (pain, swelling, bleeding, redness, odor or green/yellow discharge around incision site)    Complete by:  As directed    Call MD for:  severe or increased pain, loss or decreased feeling  in affected limb(s)    Complete by:  As directed    Call MD for:  temperature >100.5    Complete by:  As directed    Discharge wound care:    Complete by:  As directed    Shower daily with soap and water starting 01/29/16   Driving Restrictions    Complete by:  As directed    No driving for 2 weeks   Lifting restrictions    Complete by:  As directed    No lifting for 2 weeks   Resume previous diet    Complete by:  As directed       Discharge Diagnosis:  Right carotid artery stenosis I65.21  Secondary Diagnosis: Patient Active Problem List   Diagnosis Date Noted  . Symptomatic stenosis of right carotid artery  01/27/2016  . 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 Tobacco abuse 12/29/2015  . Hidradenitis suppurativa 03/29/2013   Past Medical History:  Diagnosis Date  . Anemia   . Anxiety   . Fibroid tumor    stomach  . GERD (gastroesophageal reflux disease)   . Headache   . Hepatitis    doctor told her years ago that she had hepatitis  . Hypertension   . Macular degeneration   . Stroke ([place])   . 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   atorvastatin 40 MG tablet Commonly known as:  LIPITOR Take 40 mg by mouth daily.   clindamycin 150 MG capsule Commonly  known as:  CLEOCIN Take 1 capsule (150 mg total) by mouth 3 (three) times daily.   clopidogrel 75 MG tablet Commonly known as:  PLAVIX Take 75 mg by mouth daily.   GOODSENSE ASPIRIN 325 MG tablet Generic drug:  aspirin Take 325 mg by mouth daily.   hydrochlorothiazide 25 MG tablet Commonly known as:  HYDRODIURIL Take 25 mg by mouth daily.   metoprolol tartrate 25 MG tablet Commonly known as:  LOPRESSOR Take 25 mg by mouth 2 (two) times daily.   mupirocin ointment 2 % Commonly known as:  BACTROBAN Place 1 application into the nose 3 (three) times daily.   omeprazole 20 MG capsule Commonly known as:  PRILOSEC Take 40 mg by mouth daily.   oxyCODONE-acetaminophen 5-325 MG tablet Commonly known as:  ROXICET Take 1 tablet by mouth every 6 (six) hours as needed for severe pain.   PRESERVISION AREDS 2+MULTI VIT PO Take 2 tablets by mouth daily.   quinapril 40 MG tablet Commonly known as:  ACCUPRIL Take 80 mg by mouth daily.       Prescriptions given: Roxicet #8 No Refill  Instructions: 1.  No driving x 2 weeks & while taking pain medication 2.  No heavy lifting x 2 weeks 3.  Shower daily with soap and water starting 01/29/16  Disposition: home  Patient's condition: is Good  Follow up: 1. Dr. Scot Dock in 2 weeks.   Leontine Locket, PA-C [facility] [phone]   --- For [facility] use ---   Modified Rankin score at D/C (0-6): 1  IV medication needed for:  1. Hypertension: No 2. Hypotension: No  Post-op Complications: No  1. Post-op CVA or TIA: No  If yes: Event classification (right eye, left eye, right cortical, left cortical, verterobasilar, other): n/a  If yes: Timing of event (intra-op, <6 hrs post-op, >=6 hrs post-op, unknown): n/a  2. CN injury: No  If yes: CN n/a injuried   3. Myocardial infarction: No  If yes: Dx by (EKG or clinical, Troponin): n/a  4.  CHF: No  5.  Dysrhythmia (new): No  6. Wound infection:  No  7. Reperfusion symptoms: No  8. Return to OR: No  If yes: return to OR for (bleeding, neurologic, other CEA incision, other): n/a  Discharge medications: Statin use:  Yes   If No:   ASA use:  Yes  If No:   Beta blocker use:  Yes ACE-Inhibitor use:  Yes  ARB use:  No CCB use: Yes P2Y12 Antagonist use: Yes, [x ] Plavix, [ ]  Plasugrel, [ ]  Ticlopinine, [ ]  Ticagrelor, [ ]   Other, [ ]  No for medical reason, [ ]  Non-compliant, [ ]  Not-indicated Anti-coagulant use:  No, [ ]  Warfarin, [ ]  Rivaroxaban, [ ]  Dabigatran,

## 2016-01-28 NOTE — Care Management Note (Signed)
Case Management Note  Patient Details  Name: SHABREE CRIBB MRN: [id number] Date of Birth: 10-30-1940  Subjective/Objective:  S/p CEA, patient for dc today , no needs.                  Action/Plan:   Expected Discharge Date:                  Expected Discharge Plan:  Home/Self Care  In-House Referral:     Discharge planning Services  CM Consult  Post Acute Care Choice:    Choice offered to:     DME Arranged:    DME Agency:     HH Arranged:    HH Agency:     Status of Service:  Completed, signed off  If discussed at [employer] of Stay Meetings, dates discussed:    Additional Comments:  Zenon Mayo, RN 01/28/2016, 12:05 PM

## 2016-01-29 ENCOUNTER — Telehealth: Payer: Self-pay

## 2016-01-29 LAB — TYPE AND SCREEN
ABO/RH(D): A NEG
Antibody Screen: POSITIVE
DAT, IgG: NEGATIVE
UNIT DIVISION: 0
Unit division: 0

## 2016-01-29 NOTE — Telephone Encounter (Signed)
Rec'd phone call from pt's relative; reported that pt. fell this morning in the bathroom, and hit her left side on the bathtub, hitting the lower rib cage.  Reported there is some redness in the area, and the pt. C/o soreness.  Reported the right neck incision is intact; denied any injury to the right neck incision; denied any bleeding or drainage.  Questioned if she needed to be checked by Dr. Scot Dock.  Advised that she should see her PCP re: the fall and discomfort in the left rib cage.  Advised if no bleeding or opening in the right neck incision, the pt. does not need to see Dr. Scot Dock at this time.  Relative verb. Understanding.

## 2016-01-30 ENCOUNTER — Emergency Department (HOSPITAL_COMMUNITY): Payer: Medicare PPO

## 2016-01-30 ENCOUNTER — Encounter (HOSPITAL_COMMUNITY): Payer: Self-pay | Admitting: Emergency Medicine

## 2016-01-30 ENCOUNTER — Emergency Department (HOSPITAL_COMMUNITY)
Admission: EM | Admit: 2016-01-30 | Discharge: 2016-01-30 | Disposition: A | Discharge status: Home / Self Care | Payer: Medicare PPO | Attending: Emergency Medicine | Admitting: Emergency Medicine

## 2016-01-30 DIAGNOSIS — F17210 Nicotine dependence, cigarettes, uncomplicated: Secondary | ICD-10-CM | POA: Diagnosis not present

## 2016-01-30 DIAGNOSIS — Y939 Activity, unspecified: Secondary | ICD-10-CM | POA: Insufficient documentation

## 2016-01-30 DIAGNOSIS — Z7982 Long term (current) use of aspirin: Secondary | ICD-10-CM | POA: Insufficient documentation

## 2016-01-30 DIAGNOSIS — Z8673 Personal history of transient ischemic attack (TIA), and cerebral infarction without residual deficits: Secondary | ICD-10-CM | POA: Diagnosis not present

## 2016-01-30 DIAGNOSIS — Y9289 Other specified places as the place of occurrence of the external cause: Secondary | ICD-10-CM | POA: Diagnosis not present

## 2016-01-30 DIAGNOSIS — S299XXA Unspecified injury of thorax, initial encounter: Secondary | ICD-10-CM | POA: Diagnosis present

## 2016-01-30 DIAGNOSIS — W010XXA Fall on same level from slipping, tripping and stumbling without subsequent striking against object, initial encounter: Secondary | ICD-10-CM | POA: Diagnosis not present

## 2016-01-30 DIAGNOSIS — Y999 Unspecified external cause status: Secondary | ICD-10-CM | POA: Insufficient documentation

## 2016-01-30 DIAGNOSIS — S20212A Contusion of left front wall of thorax, initial encounter: Secondary | ICD-10-CM | POA: Diagnosis not present

## 2016-01-30 DIAGNOSIS — I10 Essential (primary) hypertension: Secondary | ICD-10-CM | POA: Insufficient documentation

## 2016-01-30 DIAGNOSIS — Z79899 Other long term (current) drug therapy: Secondary | ICD-10-CM | POA: Insufficient documentation

## 2016-01-30 NOTE — ED Triage Notes (Signed)
HAD SURGERY  On her carotids on the 5 th and fell on the 6th at home fell on her left side,  Called dr and was sent for xrays she does not think she bumped her head

## 2016-01-30 NOTE — Discharge Instructions (Signed)
Start taking colace and miralax daily as needed for constipation.
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
## 2016-02-12 ENCOUNTER — Encounter: Payer: Self-pay | Admitting: Vascular Surgery

## 2016-02-18 ENCOUNTER — Ambulatory Visit (INDEPENDENT_AMBULATORY_CARE_PROVIDER_SITE_OTHER): Payer: Medicare PPO | Admitting: Vascular Surgery

## 2016-02-18 VITALS — BP 152/77 | HR 79 | Temp 97.1°F | Resp 20 | Ht 63.5 in | Wt 154.0 lb

## 2016-02-18 DIAGNOSIS — I6523 Occlusion and stenosis of bilateral carotid arteries: Secondary | ICD-10-CM

## 2016-02-18 NOTE — Progress Notes (Signed)
Patient name: Melinda Eaton MRN: [id number] DOB: [birth year] Sex: female  REASON FOR VISIT: post-op  HPI: Melinda Eaton is a 75 y.o. female who presents for her first postoperative follow-up status post right carotid endarterectomy on 01/27/2016 by Dr. Scot Dock. This was done for a right brain CVA. Patient had some residual left upper extremity weakness from prior CVA after her surgery. She also had some issues with swallowing since her CVA. She had an uncomplicated postoperative course and was discharged on postop day 1.  The patient states that her swallowing issues have improved. She is no longer on liquids. She is eating a normal diet. However, she states that she feels like there is "something" in her throat. She is being referred to ENT by her PCP. Patient also reports feeling lightheaded intermittently since her surgery. She denies any weakness. She denies feeling syncopal.  The patient denies any episodes of amaurosis fugax, sudden onset weakness or numbness of the extremities or slurred speech.  The patient is currently on aspirin, Plavix and a statin.    Current Outpatient Prescriptions  Medication Sig Dispense Refill  . amLODipine (NORVASC) 10 MG tablet Take 10 mg by mouth daily.     Marland Kitchen aspirin (GOODSENSE ASPIRIN) 325 MG tablet Take 325 mg by mouth daily.    Marland Kitchen atorvastatin (LIPITOR) 40 MG tablet Take 40 mg by mouth daily.     . clindamycin (CLEOCIN) 150 MG capsule Take 1 capsule (150 mg total) by mouth 3 (three) times daily.    . clopidogrel (PLAVIX) 75 MG tablet Take 75 mg by mouth daily.     . hydrochlorothiazide (HYDRODIURIL) 25 MG tablet Take 25 mg by mouth daily.    . metoprolol tartrate (LOPRESSOR) 25 MG tablet Take 25 mg by mouth 2 (two) times daily.    . Multiple Vitamins-Minerals (PRESERVISION AREDS 2+MULTI VIT PO) Take 2 tablets by mouth daily.     . mupirocin ointment (BACTROBAN) 2 % Place 1 application into the nose 3 (three) times daily.     Marland Kitchen omeprazole  (PRILOSEC) 20 MG capsule Take 40 mg by mouth daily.     Marland Kitchen oxyCODONE-acetaminophen (ROXICET) 5-325 MG tablet Take 1 tablet by mouth every 6 (six) hours as needed for severe pain. 8 tablet 0  . quinapril (ACCUPRIL) 40 MG tablet Take 80 mg by mouth daily.     No current facility-administered medications for this visit.     REVIEW OF SYSTEMS:  [X]  denotes positive finding, [ ]  denotes negative finding Cardiac  Comments:  Chest pain or chest pressure:    Shortness of breath upon exertion:    Short of breath when lying flat:    Irregular heart rhythm:    Constitutional    Fever or chills:      PHYSICAL EXAM: Vitals:   02/18/16 1502 02/18/16 1504  BP: (!) 147/74 (!) 152/77  Pulse: 79   Resp: 20   Temp: 97.1 F (36.2 C)   TempSrc: Oral   SpO2: 97%   Weight: 154 lb (69.9 kg)   Height: 5' 3.5" (1.613 m)     GENERAL: The patient is a well-nourished female, in no acute distress. The vital signs are documented above. CARDIOVASCULAR: There is a regular rate and rhythm. PULMONARY: There is good air exchange bilaterally without wheezing or rales. VASCULAR: Right neck incision healing well. No hematoma. NEURO: Slight right smile asymmetry, no tongue deviation. Follow up out of 5 strength upper and lower extremities bilaterally.  MEDICAL ISSUES: Status  post right carotid endarterectomy  The patient is doing well following her surgery. She denies any swallowing issues however she states there is a sensation of something in her throat. She is being referred to ENT by her PCP. She denies any new TIA or stroke symptoms. She is on aspirin, Plavix and a statin. She has a 40-59% left internal carotid artery stenosis. She will follow up in 6 months with repeat carotid duplex. She knows to contact us sooner if she develops any new neurological symptoms.  Virgina Jock, PA-C [facility]

## 2016-02-19 NOTE — Addendum Note (Signed)
Addended by: Lianne Cure A on: 02/19/2016 09:44 AM   Modules accepted: Orders

## 2016-06-02 ENCOUNTER — Ambulatory Visit: Payer: Medicare PPO | Admitting: Neurology

## 2016-06-02 ENCOUNTER — Encounter: Payer: Self-pay | Admitting: Neurology

## 2016-06-02 ENCOUNTER — Telehealth: Payer: Self-pay | Admitting: Neurology

## 2016-06-02 NOTE — Telephone Encounter (Signed)
This patient did not show for a revisit appointment today. 

## 2016-08-06 ENCOUNTER — Encounter: Payer: Self-pay | Admitting: Vascular Surgery

## 2016-08-18 ENCOUNTER — Ambulatory Visit (HOSPITAL_COMMUNITY)
Admission: RE | Admit: 2016-08-18 | Discharge: 2016-08-18 | Disposition: A | Discharge status: Home / Self Care | Payer: Medicare PPO | Source: Ambulatory Visit | Attending: Vascular Surgery | Admitting: Vascular Surgery

## 2016-08-18 ENCOUNTER — Ambulatory Visit (INDEPENDENT_AMBULATORY_CARE_PROVIDER_SITE_OTHER): Payer: Medicare PPO | Admitting: Vascular Surgery

## 2016-08-18 VITALS — BP 167/71 | HR 61 | Temp 98.5°F | Resp 20 | Ht 63.5 in | Wt 188.0 lb

## 2016-08-18 DIAGNOSIS — I6523 Occlusion and stenosis of bilateral carotid arteries: Secondary | ICD-10-CM

## 2016-08-18 DIAGNOSIS — I739 Peripheral vascular disease, unspecified: Secondary | ICD-10-CM

## 2016-08-18 LAB — VAS US CAROTID
LCCADSYS: -96 cm/s
LCCAPDIAS: 20 cm/s
LEFT ECA DIAS: -7 cm/s
LEFT VERTEBRAL DIAS: 10 cm/s
LICADSYS: -112 cm/s
LICAPDIAS: 60 cm/s
LICAPSYS: 251 cm/s
Left CCA dist dias: -18 cm/s
Left CCA prox sys: 100 cm/s
Left ICA dist dias: -28 cm/s
RCCAPDIAS: 17 cm/s
RCCAPSYS: 156 cm/s
RIGHT CCA MID DIAS: -17 cm/s
RIGHT ECA DIAS: -14 cm/s
RIGHT VERTEBRAL DIAS: 7 cm/s
Right cca dist sys: -94 cm/s

## 2016-08-18 NOTE — Progress Notes (Signed)
[facility]  Patient name: Melinda Eaton MRN: [id number] DOB: [birth year] Sex: female  REASON FOR VISIT: Follow up  HPI: Melinda Eaton is a 76 y.o. female history of CVA and underwent right carotid endarterectomy on 01/27/2016 with Dr. Scot Dock. At her first postoperative visit, she had some swallowing issues. She was going to see ENT however, her issues resolved prior to this. She denies any current swallowing issues. She denies any episodes of amaurosis fugax, sudden onset weakness or numbness in the extremities, expressive or receptive aphasia.   She denies any changes in her medical history except for some weight gain. She feels this is secondary to increased food intake. She also mentions that she has some cramping in her right leg after she walks 10 minutes. This is better with rest. She has had these issues for "forever." She denies any issues on the left leg. She denies any nonhealing wounds or rest pain.  The patient is not diabetic. She is a smoker and smokes 1 pack per day. She is on multiple medications for hypertension that is moderately controlled.  Past Medical History:  Diagnosis Date  . Anemia   . Anxiety   . Fibroid tumor    stomach  . GERD (gastroesophageal reflux disease)   . Headache   . Hepatitis    doctor told her years ago that she had hepatitis  . Hypertension   . Macular degeneration   . Stroke ([place])   . 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 Cancer Father        prostate  . Cancer Sister     SOCIAL HISTORY: Social History  Substance Use Topics  . Smoking status: Current Every Day Smoker    Packs/day: 1.00    Years: 50.00    Types: Cigarettes  . Smokeless tobacco: Never Used     Comment: 1 pk or less per day  . Alcohol use No    Allergies  Allergen Reactions  . Bactrim [Sulfamethoxazole-Trimethoprim] Other (See Comments)   hepatitis  . Doxycycline Other (See Comments)    Headache     Current Outpatient Prescriptions  Medication Sig Dispense Refill  . amLODipine (NORVASC) 10 MG tablet Take 10 mg by mouth daily.     Marland Kitchen aspirin (GOODSENSE ASPIRIN) 325 MG tablet Take 325 mg by mouth daily.    Marland Kitchen atorvastatin (LIPITOR) 40 MG tablet Take 40 mg by mouth daily.     . clindamycin (CLEOCIN) 150 MG capsule Take 1 capsule (150 mg total) by mouth 3 (three) times daily.    . clopidogrel (PLAVIX) 75 MG tablet Take 75 mg by mouth daily.     . hydrochlorothiazide (HYDRODIURIL) 25 MG tablet Take 25 mg by mouth daily.    . metoprolol tartrate (LOPRESSOR) 25 MG tablet Take 25 mg by mouth 2 (two) times daily.    . Multiple Vitamins-Minerals (PRESERVISION AREDS 2+MULTI VIT PO) Take 2 tablets by mouth daily.     . mupirocin ointment (BACTROBAN) 2 % Place 1 application into the nose 3 (three) times daily.     . quinapril (ACCUPRIL) 40 MG tablet Take 80 mg by mouth daily.     No current facility-administered medications for this visit.     REVIEW OF SYSTEMS:  [X]  denotes positive finding, [ ]  denotes negative finding Cardiac  Comments:  Chest pain or chest pressure:    Shortness  of breath upon exertion: x   Short of breath when lying flat:    Irregular heart rhythm:        Vascular    Pain in calf, thigh, or hip brought on by ambulation:    Pain in feet at night that wakes you up from your sleep:     Blood clot in your veins:    Leg swelling:         Pulmonary    Oxygen at home:    Productive cough:     Wheezing:         Neurologic    Sudden weakness in arms or legs:     Sudden numbness in arms or legs:     Sudden onset of difficulty speaking or slurred speech:    Temporary loss of vision in one eye:     Problems with dizziness:         Gastrointestinal    Blood in stool:     Vomited blood:         Genitourinary    Burning when urinating:     Blood in urine:        Psychiatric    Major depression:  x        Hematologic    Bleeding problems:    Problems with blood clotting too easily:        Skin    Rashes or ulcers:        Constitutional    Fever or chills:      PHYSICAL EXAM: Vitals:   08/18/16 1444 08/18/16 1447  BP: (!) 164/76 (!) 167/71  Pulse: 61   Resp: 20   Temp: 98.5 F (36.9 C)   TempSrc: Oral   SpO2: 97%   Weight: 188 lb (85.3 kg)   Height: 5' 3.5" (1.613 m)     GENERAL: The patient is a well-nourished female, in no acute distress. The vital signs are documented above. HEENT: normocephalic, atraumatic. No abnormalities noted.  CARDIAC: There is a regular rate and rhythm. No carotid bruits.  VASCULAR: Non palpable pedal pulses, but feet are warm and well perfused  PULMONARY: There is good air exchange bilaterally without wheezing or rales. ABDOMEN: Soft and non-tender. Obese. No pulsatile mass.  MUSCULOSKELETAL: There are no major deformities or cyanosis. NEUROLOGIC: No focal weakness or paresthesias are detected. SKIN: There are no ulcers or rashes noted. PSYCHIATRIC: The patient has a normal affect.  DATA:  Carotid duplex 08/18/16  Patent right carotid endarterectomy site Left carotid stenosis 40-59% (high end of range)  MEDICAL ISSUES: Carotid stenosis, bilateral Status post right carotid endarterectomy  Right carotid endarterectomy site is patent. Left 40-59% internal carotid artery stenosis. No TIA or stroke symptoms. Is on an aspirin, Plavix and statin. Counseled on smoking cessation for greater than 3 minutes. She'll follow up in 1 year with repeat carotid duplex  Right lower extremity claudication  Likely has PAD. No nonhealing wounds or rest pain. Is not diabetic. Encouraged walking regimen of 30 minutes daily. Currently on aspirin, Plavix and statin. Encouraged smoking cessation. We will obtain ABIs for baseline in one year.  Virgina Jock, PA-C [facility] MD: Scot Dock

## 2016-09-21 NOTE — Addendum Note (Signed)
Addended by: Lianne Cure A on: 09/21/2016 05:09 PM   Modules accepted: Orders

## 2017-04-05 ENCOUNTER — Emergency Department (HOSPITAL_COMMUNITY)
Admission: EM | Admit: 2017-04-05 | Discharge: 2017-04-06 | Disposition: A | Discharge status: Home / Self Care | Payer: Medicare PPO | Attending: Emergency Medicine | Admitting: Emergency Medicine

## 2017-04-05 ENCOUNTER — Encounter (HOSPITAL_COMMUNITY): Payer: Self-pay | Admitting: *Deleted

## 2017-04-05 ENCOUNTER — Other Ambulatory Visit: Payer: Self-pay

## 2017-04-05 DIAGNOSIS — T50905A Adverse effect of unspecified drugs, medicaments and biological substances, initial encounter: Secondary | ICD-10-CM | POA: Diagnosis not present

## 2017-04-05 DIAGNOSIS — K2900 Acute gastritis without bleeding: Secondary | ICD-10-CM | POA: Diagnosis not present

## 2017-04-05 DIAGNOSIS — R1013 Epigastric pain: Secondary | ICD-10-CM | POA: Diagnosis present

## 2017-04-05 LAB — COMPREHENSIVE METABOLIC PANEL
ALK PHOS: 73 U/L (ref 38–126)
ALT: 19 U/L (ref 14–54)
ANION GAP: 14 (ref 5–15)
AST: 15 U/L (ref 15–41)
Albumin: 3.4 g/dL — ABNORMAL LOW (ref 3.5–5.0)
BILIRUBIN TOTAL: 0.8 mg/dL (ref 0.3–1.2)
BUN: 12 mg/dL (ref 6–20)
CALCIUM: 8.7 mg/dL — AB (ref 8.9–10.3)
CO2: 25 mmol/L (ref 22–32)
Chloride: 93 mmol/L — ABNORMAL LOW (ref 101–111)
Creatinine, Ser: 0.88 mg/dL (ref 0.44–1.00)
GLUCOSE: 169 mg/dL — AB (ref 65–99)
POTASSIUM: 3.7 mmol/L (ref 3.5–5.1)
Sodium: 132 mmol/L — ABNORMAL LOW (ref 135–145)
TOTAL PROTEIN: 7.3 g/dL (ref 6.5–8.1)

## 2017-04-05 LAB — LIPASE, BLOOD: Lipase: 50 U/L (ref 11–51)

## 2017-04-05 LAB — CBC
HEMATOCRIT: 37.5 % (ref 36.0–46.0)
HEMOGLOBIN: 12.6 g/dL (ref 12.0–15.0)
MCH: 28.4 pg (ref 26.0–34.0)
MCHC: 33.6 g/dL (ref 30.0–36.0)
MCV: 84.5 fL (ref 78.0–100.0)
Platelets: 219 10*3/uL (ref 150–400)
RBC: 4.44 MIL/uL (ref 3.87–5.11)
RDW: 14.2 % (ref 11.5–15.5)
WBC: 13 10*3/uL — AB (ref 4.0–10.5)

## 2017-04-05 MED ORDER — ONDANSETRON HCL 4 MG/2ML IJ SOLN
4.0000 mg | Freq: Once | INTRAMUSCULAR | Status: AC
  Administered 2017-04-05: 4 mg via INTRAVENOUS
  Filled 2017-04-05: qty 2

## 2017-04-05 MED ORDER — PANTOPRAZOLE SODIUM 40 MG IV SOLR
40.0000 mg | Freq: Once | INTRAVENOUS | Status: AC
  Administered 2017-04-05: 40 mg via INTRAVENOUS
  Filled 2017-04-05: qty 40

## 2017-04-05 MED ORDER — SUCRALFATE 1 GM/10ML PO SUSP
1.0000 g | Freq: Once | ORAL | Status: AC
  Administered 2017-04-05: 1 g via ORAL
  Filled 2017-04-05: qty 10

## 2017-04-05 MED ORDER — ONDANSETRON 4 MG PO TBDP: 4.0000 mg | ORAL_TABLET | Freq: Once | ORAL | Status: DC | PRN

## 2017-04-05 NOTE — ED Triage Notes (Signed)
Pt with abd pain and nausea, she has had abd pain on and off due to a change of antibiotics.
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
## 2017-04-06 LAB — URINALYSIS, ROUTINE W REFLEX MICROSCOPIC
BILIRUBIN URINE: NEGATIVE
GLUCOSE, UA: NEGATIVE mg/dL
Hgb urine dipstick: NEGATIVE
KETONES UR: 5 mg/dL — AB
Nitrite: NEGATIVE
PH: 6 (ref 5.0–8.0)
PROTEIN: NEGATIVE mg/dL
Specific Gravity, Urine: 1.012 (ref 1.005–1.030)

## 2017-04-06 MED ORDER — PANTOPRAZOLE SODIUM 20 MG PO TBEC: DELAYED_RELEASE_TABLET | ORAL | 0 refills | Status: DC

## 2017-04-06 MED ORDER — SUCRALFATE 1 G PO TABS: 1.0000 g | ORAL_TABLET | Freq: Three times a day (TID) | ORAL | 0 refills | Status: DC

## 2017-04-19 ENCOUNTER — Other Ambulatory Visit: Payer: Self-pay | Admitting: Surgery

## 2017-08-23 ENCOUNTER — Other Ambulatory Visit: Payer: Self-pay

## 2017-08-23 DIAGNOSIS — I739 Peripheral vascular disease, unspecified: Secondary | ICD-10-CM

## 2017-08-24 ENCOUNTER — Encounter (HOSPITAL_COMMUNITY): Payer: Medicare PPO

## 2017-08-24 ENCOUNTER — Ambulatory Visit: Payer: Medicare PPO | Admitting: Family

## 2017-08-26 ENCOUNTER — Encounter (HOSPITAL_COMMUNITY): Payer: Medicare PPO

## 2017-08-26 ENCOUNTER — Ambulatory Visit: Payer: Medicare PPO | Admitting: Family

## 2017-08-29 ENCOUNTER — Ambulatory Visit: Payer: Medicare PPO | Admitting: Family

## 2017-12-05 ENCOUNTER — Ambulatory Visit: Admit: 2017-12-05 | Discharge: 2017-12-06 | Payer: MEDICARE

## 2017-12-05 DIAGNOSIS — Z7289 Other problems related to lifestyle: Secondary | ICD-10-CM

## 2017-12-05 DIAGNOSIS — Z79899 Other long term (current) drug therapy: Secondary | ICD-10-CM

## 2017-12-05 DIAGNOSIS — Z1159 Encounter for screening for other viral diseases: Secondary | ICD-10-CM

## 2017-12-05 DIAGNOSIS — L732 Hidradenitis suppurativa: Principal | ICD-10-CM

## 2017-12-05 LAB — EGFR CKD-EPI NON-AA FEMALE: Lab: 74

## 2017-12-05 LAB — CBC W/ AUTO DIFF
BASOPHILS RELATIVE PERCENT: 0.4 %
EOSINOPHILS ABSOLUTE COUNT: 0.2 10*9/L (ref 0.0–0.4)
EOSINOPHILS RELATIVE PERCENT: 1.1 %
HEMATOCRIT: 33.5 % — ABNORMAL LOW (ref 36.0–46.0)
HEMOGLOBIN: 10.5 g/dL — ABNORMAL LOW (ref 13.5–16.0)
LARGE UNSTAINED CELLS: 2 % (ref 0–4)
LYMPHOCYTES ABSOLUTE COUNT: 1 10*9/L — ABNORMAL LOW (ref 1.5–5.0)
LYMPHOCYTES RELATIVE PERCENT: 7.4 %
MEAN CORPUSCULAR HEMOGLOBIN CONC: 31.5 g/dL (ref 31.0–37.0)
MEAN CORPUSCULAR HEMOGLOBIN: 25.4 pg — ABNORMAL LOW (ref 26.0–34.0)
MEAN CORPUSCULAR VOLUME: 80.7 fL (ref 80.0–100.0)
MEAN PLATELET VOLUME: 6.6 fL — ABNORMAL LOW (ref 7.0–10.0)
MONOCYTES ABSOLUTE COUNT: 0.5 10*9/L (ref 0.2–0.8)
MONOCYTES RELATIVE PERCENT: 3.9 %
NEUTROPHILS ABSOLUTE COUNT: 11.4 10*9/L — ABNORMAL HIGH (ref 2.0–7.5)
NEUTROPHILS RELATIVE PERCENT: 85.5 %
PLATELET COUNT: 406 10*9/L (ref 150–440)
RED BLOOD CELL COUNT: 4.15 10*12/L (ref 4.00–5.20)
RED CELL DISTRIBUTION WIDTH: 16.4 % — ABNORMAL HIGH (ref 12.0–15.0)
WBC ADJUSTED: 13.3 10*9/L — ABNORMAL HIGH (ref 4.5–11.0)

## 2017-12-05 LAB — BLOOD UREA NITROGEN: Urea nitrogen:MCnc:Pt:Ser/Plas:Qn:: 20

## 2017-12-05 LAB — ALT (SGPT): Alanine aminotransferase:CCnc:Pt:Ser/Plas:Qn:: <8 — ABNORMAL LOW

## 2017-12-05 LAB — CREATININE
EGFR CKD-EPI AA FEMALE: 85 mL/min/{1.73_m2} (ref >=60)
EGFR CKD-EPI NON-AA FEMALE: 74 mL/min/{1.73_m2} (ref >=60)

## 2017-12-05 LAB — AST (SGOT): Aspartate aminotransferase:CCnc:Pt:Ser/Plas:Qn:: 13 — ABNORMAL LOW

## 2017-12-05 LAB — BASOPHILS RELATIVE PERCENT: Lab: 0.4

## 2017-12-05 MED ORDER — METRONIDAZOLE 500 MG TABLET
ORAL_TABLET | 2 refills | 0 days | Status: CP
Start: 2017-12-05 — End: 2018-03-14

## 2017-12-05 MED ORDER — ADALIMUMAB 80 MG/0.8 ML SUBCUTANEOUS PEN KIT
PACK | 0 refills | 0 days | Status: CP
Start: 2017-12-05 — End: ?

## 2017-12-05 MED ORDER — LEVOFLOXACIN 750 MG TABLET
ORAL_TABLET | Freq: Every day | ORAL | 2 refills | 0 days | Status: CP
Start: 2017-12-05 — End: 2018-03-07

## 2017-12-05 MED ORDER — ADALIMUMAB PEN CITRATE FREE 40 MG/0.4 ML
11 refills | 0 days | Status: CP
Start: 2017-12-05 — End: 2018-05-03

## 2017-12-05 MED ORDER — FINASTERIDE 5 MG TABLET
ORAL_TABLET | Freq: Every day | ORAL | 3 refills | 0.00000 days | Status: CP
Start: 2017-12-05 — End: 2018-12-05

## 2017-12-05 NOTE — Unmapped (Signed)

## 2017-12-05 NOTE — Unmapped (Deleted)
ASSESSMENT/PLAN:  Hidradenitis Suppurativa   1. We discussed the typical natural history, pathogenesis, treatment options, and expected course as well as the relapsing and sometimes recalcitrant nature of the disease.    2. Start levofloxacin 750mg  daily and metronidazole 500mg  po tid.  Side effects reviewed.  3. Finasteride 5mg  once daily  4. Start Humira 160mg  day 1, then 80mg  day 15, then 40mg  [place] qweek starting day 29 for treatment of hidradenitis.  Discussed risks of tuberculosis, other uncommon infections, theoretical risk of malignancies, and other uncommon side effects.      RTC: 8 weeks    SUBJECTIVE:    CC: Hidradenitis Suppurativa    Dawn Ritter is a 77 y.o. female  who is seen for consultation today at the request of Dr. Doreen Beam for evaluation of hidradenitis suppurativa.    Self-reported severity (0-5): {0-5:36085}  VAS pain today: {numbers 0-10:5044}  VAS average pain for the last month: {numbers 0-10:5044}  Requiring pain medication? {YES/NO:21013}  If so, what type/frequency? ***  How often in pain?  {hspain:53295}  Level of odor (0-5): {0-5 selection:53296}  Level of itching (0-5): {0-5 selection:53296}  Dressing changes needed for drainage:{hs dressings:52675}  How much drainage: {hsdrainage:52676}  Flare in the last month (Y/N)? {YES/NO:21013}  How long ago was the last flare? {last flare:53297}  Developing new lesions? {hsnewlesion:52677}  Number of inflammatory lesions montly: {number of lesions monthly:53298}  DLQI: ***  Current treatment: ***     How helpful is the current treatment in managing the following aspects of your disease?  Not at all helpful Somewhat helpful Very helpful   Pain      Decreasing length of flares      Decreasing new lesions      Drainage      Decreasing frequency of flares      Decreasing severity of flares      Odor        Disease course:  Year when symptoms first noticed: ***  Year of diagnosis: ***  Who diagnosed you? {who diagnosed hs:53299}  Location of first symptoms: {hs location:53300}  Typical involved areas include: {hs location:53300}  Typical number of inflammatory lesions each month at baseline (from first visit): {number of lesions monthly:53298}  Disease triggers: {hs triggers:53301}    Are menstrual cycles irregular when not on birth control? {YES/NO:21013}  Current form of contraception: ***  Effect of hormonal contraception on disease: {better worse no change:53302}  Flaring with menstrual cycle (before, during, or after?): {before during after:53303}  Difficulty becoming pregnancy? {yes no n/a:55615}  Pregnancy complications? {pregnancy complications hs:53304}  How many children? {gen number 1-61:096045}  Better or worse with pregnancy?  {better worse no change:53302}.  If so, worse during a specific trimester? {first second third:53305}      FH:      Patient Mother Father Son Daughter Brother Sister Maternal Grandmother Maternal Grandfather Paternal Grandmother Paternal Grandfather Maternal Aunt Maternal Uncle Paternal Aunt Paternal Uncle Other:   Derm Hidradenitis Suppurativa                                      Pilonidal sinus                                     Acne  Dissecting cellulitis(Scalp)                                     Eczema                                     Allergies                    Rheum Joint pains                                      SAPHO                                     Pyoderma gangrenosum                                     Back pain                                    Auto-immune disease                                     Asthma                    Endo Polycystic ovarian syndrome                                     Thyroid disease                    Vitamin D deficiency                   Psych Anxiety                                     Depression                                     Dementia                                     Suicidal thoughts*                                   Cardio Hypertension                                     High cholesterol  Heart attack                                    Stroke                                     Hem-onc Cancer  ______________                                    Anemia                    GI IBD (UC/Crohn's)                                   ID HIV                     Syphilis                     Other                         Social History:  Current or former smoker? {hsnewlesion:53307}  Amount smoking: {amount smoking:53308}  How many years: ***  ED visits in the last 5 years? {ed visits:53309}  Difficulty affording medications? {afford meds:53311}  Marital Status: {marital hs:53310}  Living with some one? {YES/NO:21013}    Prior treatments:  Topical: ***  Systemic: ***  Past surgical procedures: ***  Past laser procedures: ***          ROS: the balance of 10 systems is negative unless otherwise documented      OBJECTIVE:   Gen: Well-appearing patient, appropriate, interactive, in no acute distress  Skin: Examination of the scalp, face, neck, chest, back, abdomen, bilateral upper and lower extremities, hands, palms, soles, nails, buttocks, and external genitalia performed today and pertinent for:     location Abscess Inflamed nodule Non-inflamed nodule Draining sinus Non-draining Sinus [place] % scar   R axilla          L axilla          R inframammary          L inframammary          Intermammary          Pubic          R inguinal          R thigh          L inguinal          L thigh          Scrotum/Vulva          Perianal          R buttock          L buttock          Other (list)                      AN count (total sum of abscess and inflammatory nodule): ***  Pilonidal sinus (Y/N, or previously treated)? {pilonidal presence:55619}  Approximate BSA involved by inflammatory lesions: ***  Intertriginous comedones: {Desc; few - many:55616}  Diffuse comedones (trunk,  face, etc): {Desc; few - many:55616}  Acne scars: {Desc; few - many:55617}  Cribriform scarring: {YES/NO:21013}  Intertrigionus epidermal inclusion cysts: {Desc; few - many:55618}  Diffuse (trunk, feace, extremities) epidermal inclusion cysts: {Desc; few - UVOZ:36644}

## 2017-12-05 NOTE — Unmapped (Signed)
ASSESSMENT/PLAN:  Hidradenitis Suppurativa   1. We discussed the typical natural history, pathogenesis, treatment options, and expected course as well as the relapsing and sometimes recalcitrant nature of the disease.    2. Start levofloxacin 750mg  daily and metronidazole 500mg  po tid.  Side effects reviewed.  3. Finasteride 5mg  once daily  4. Start Humira 160mg  day 1, then 80mg  day 15, then 40mg  [place] qweek starting day 29 for treatment of hidradenitis.  Discussed risks of tuberculosis, other uncommon infections, theoretical risk of malignancies, and other uncommon side effects.   5. Quant gold, hepatitis labs, baselines today    RTC: 2 months    SUBJECTIVE:    CC: Hidradenitis Suppurativa    Dawn Ritter is a 77 y.o. female  who is seen for consultation today at the request of Dr. Doreen Beam for evaluation of hidradenitis suppurativa.    Self-reported severity (0-5): 5  VAS pain today: 10  VAS average pain for the last month: 10  Requiring pain medication? Yes.  If so, what type/frequency? 2 tylenol tablets qid  How often in pain?  continuously  Level of odor (0-5): 4  Level of itching (0-5): 4  Dressing changes needed for drainage:Several times a day  How much drainage: a lot of drainage  Flare in the last month (Y/N)? Yes.  How long ago was the last flare? in last 6 months  Developing new lesions? every day  Number of inflammatory lesions montly: 10 or more  DLQI: 23  Current treatment: clindamycin  Topical and oral (for 7 years)  How helpful is the current treatment in managing the following aspects of your disease?  Not at all helpful Somewhat helpful Very helpful   Pain      Decreasing length of flares      Decreasing new lesions      Drainage      Decreasing frequency of flares      Decreasing severity of flares      Odor        Disease course:  Year when symptoms first noticed: 77 yrs old  Year of diagnosis: 2012  Who diagnosed you? Surgeon  Location of first symptoms: groin, inner thighs and buttocks Typical involved areas include: groin, inner thighs and buttocks  Typical number of inflammatory lesions each month at baseline (from first visit): 10 or more  Disease triggers: stress    Are menstrual cycles irregular when not on birth control? No.  Current form of contraception: menopause  Effect of hormonal contraception on disease: never tried  Flaring with menstrual cycle (before, during, or after?): no relationship  Difficulty becoming pregnancy? No.  Pregnancy complications? other none  How many children? Not asnwered  Was not active during any pregnancy    FH:      Patient Mother Father Son Daughter Brother Sister Maternal Grandmother Maternal Grandfather Paternal Grandmother Paternal Grandfather Maternal Aunt Maternal Uncle Paternal Aunt Paternal Uncle Other:   Derm Hidradenitis Suppurativa   *                                   Pilonidal sinus                                     Acne  Dissecting cellulitis([place])                                     Eczema                                     Allergies                    Rheum Joint pains                                      SAPHO                                     Pyoderma gangrenosum                                     Back pain                                    Auto-immune disease                                     Asthma                    Endo Polycystic ovarian syndrome                                     Thyroid disease                    Vitamin D deficiency                   Psych Anxiety  *                                   Depression  *                                   Dementia                                     Suicidal thoughts*                                   Cardio Hypertension *                                    High cholesterol  Heart attack                                    Stroke   *                                  Hem-onc Cancer  ______________ Anemia  *                  GI IBD (UC/Crohn's)                                   ID HIV                     Syphilis                     Other                         Social History:  Current or former smoker? current  Amount smoking: more than 1 ppd  How many years: started at age 33  ED visits in the last 5 years? never  Difficulty affording medications? always  Marital Status: divorced  Living with some one? Yes.    Prior treatments:  Topical: clindamycin  Systemic: clindamycin, rifampin (GI upset), Bactrim (allergic), doxycycline (GI upset)  Past surgical procedures: yes. Inner thigh and groin excision with closure, recurred  Past laser procedures: no          ROS: the balance of 10 systems is negative unless otherwise documented      OBJECTIVE:   Gen: Well-appearing patient, appropriate, interactive, in no acute distress  Skin: Examination of the scalp, face, neck, chest, back, abdomen, bilateral upper and lower extremities, hands, palms, soles, nails, buttocks, and external genitalia performed today and pertinent for:     location Abscess Inflamed nodule Non-inflamed nodule Draining sinus Non-draining Sinus [place] % scar   R axilla          L axilla          R inframammary          L inframammary          Intermammary          Pubic          R inguinal  6  5      R thigh          L inguinal  4  3      L thigh          Scrotum/Vulva          Perianal          R buttock  2  1 1      L buttock  1  1      Other (list)                      AN count (total sum of abscess and inflammatory nodule): 13  Pilonidal sinus (Y/N, or previously treated)? No.  Approximate BSA involved by inflammatory lesions: 3%  Intertriginous comedones: few  Diffuse comedones (trunk, face, etc): none  Acne scars: facial  Cribriform scarring: No.  Intertrigionus epidermal inclusion cysts: 1-3  Diffuse (trunk, feace,  extremities) epidermal inclusion cysts: none  Regular phenoytpe

## 2017-12-06 LAB — QUANTIFERON TB GOLD PLUS
Lab: NEGATIVE
QUANTIFERON ANTIGEN 1 MINUS NIL: -0.03 [IU]/mL
QUANTIFERON ANTIGEN 2 MINUS NIL: -0.03 [IU]/mL
QUANTIFERON TB GOLD PLUS: NEGATIVE
QUANTIFERON TB NIL VALUE: 0.1 [IU]/mL

## 2017-12-06 LAB — HEPATITIS B SURFACE ANTIBODY QUANT: Hepatitis B virus surface Ab:ACnc:Pt:Ser:Qn:: <8

## 2017-12-06 LAB — HEPATITIS B SURFACE ANTIGEN: Hepatitis B virus surface Ag:PrThr:Pt:Ser:Ord:: NONREACTIVE

## 2017-12-06 LAB — HEPATITIS B CORE TOTAL ANTIBODY: Hepatitis B virus core Ab:PrThr:Pt:Ser/Plas:Ord:IA: NONREACTIVE

## 2017-12-06 LAB — HEPATITIS C ANTIBODY: Hepatitis C virus Ab:PrThr:Pt:Ser:Ord:: NONREACTIVE

## 2017-12-08 NOTE — Unmapped (Signed)
Contacted patient with result via MyChart on 12/07/17

## 2017-12-16 ENCOUNTER — Other Ambulatory Visit: Payer: Self-pay

## 2017-12-16 ENCOUNTER — Encounter (HOSPITAL_COMMUNITY): Payer: Self-pay | Admitting: Emergency Medicine

## 2017-12-16 ENCOUNTER — Observation Stay (HOSPITAL_COMMUNITY)
Admission: EM | Admit: 2017-12-16 | Discharge: 2017-12-17 | Discharge status: Against Medical Advice | Payer: Medicare PPO | Attending: Internal Medicine | Admitting: Internal Medicine

## 2017-12-16 ENCOUNTER — Emergency Department (HOSPITAL_COMMUNITY): Payer: Medicare PPO

## 2017-12-16 DIAGNOSIS — I10 Essential (primary) hypertension: Secondary | ICD-10-CM | POA: Diagnosis present

## 2017-12-16 DIAGNOSIS — Z7982 Long term (current) use of aspirin: Secondary | ICD-10-CM | POA: Insufficient documentation

## 2017-12-16 DIAGNOSIS — D500 Iron deficiency anemia secondary to blood loss (chronic): Principal | ICD-10-CM | POA: Insufficient documentation

## 2017-12-16 DIAGNOSIS — Z8673 Personal history of transient ischemic attack (TIA), and cerebral infarction without residual deficits: Secondary | ICD-10-CM | POA: Diagnosis not present

## 2017-12-16 DIAGNOSIS — L732 Hidradenitis suppurativa: Secondary | ICD-10-CM | POA: Diagnosis not present

## 2017-12-16 DIAGNOSIS — R42 Dizziness and giddiness: Secondary | ICD-10-CM | POA: Diagnosis present

## 2017-12-16 DIAGNOSIS — I951 Orthostatic hypotension: Secondary | ICD-10-CM

## 2017-12-16 DIAGNOSIS — F17210 Nicotine dependence, cigarettes, uncomplicated: Secondary | ICD-10-CM | POA: Insufficient documentation

## 2017-12-16 DIAGNOSIS — R195 Other fecal abnormalities: Secondary | ICD-10-CM | POA: Diagnosis present

## 2017-12-16 DIAGNOSIS — Z79899 Other long term (current) drug therapy: Secondary | ICD-10-CM | POA: Insufficient documentation

## 2017-12-16 DIAGNOSIS — Z720 Tobacco use: Secondary | ICD-10-CM | POA: Diagnosis present

## 2017-12-16 DIAGNOSIS — Z5329 Procedure and treatment not carried out because of patient's decision for other reasons: Secondary | ICD-10-CM | POA: Insufficient documentation

## 2017-12-16 DIAGNOSIS — Z7984 Long term (current) use of oral hypoglycemic drugs: Secondary | ICD-10-CM | POA: Insufficient documentation

## 2017-12-16 DIAGNOSIS — Z7902 Long term (current) use of antithrombotics/antiplatelets: Secondary | ICD-10-CM | POA: Insufficient documentation

## 2017-12-16 LAB — DIFFERENTIAL
Abs Immature Granulocytes: 0.08 10*3/uL — ABNORMAL HIGH (ref 0.00–0.07)
Basophils Absolute: 0.1 10*3/uL (ref 0.0–0.1)
Basophils Relative: 0 %
Eosinophils Absolute: 0.1 10*3/uL (ref 0.0–0.5)
Eosinophils Relative: 1 %
Immature Granulocytes: 1 %
Lymphocytes Relative: 12 %
Lymphs Abs: 1.6 10*3/uL (ref 0.7–4.0)
Monocytes Absolute: 0.9 10*3/uL (ref 0.1–1.0)
Monocytes Relative: 7 %
Neutro Abs: 10.6 10*3/uL — ABNORMAL HIGH (ref 1.7–7.7)
Neutrophils Relative %: 79 %

## 2017-12-16 LAB — COMPREHENSIVE METABOLIC PANEL
ALT: 12 U/L (ref 0–44)
AST: 16 U/L (ref 15–41)
Albumin: 3.1 g/dL — ABNORMAL LOW (ref 3.5–5.0)
Alkaline Phosphatase: 49 U/L (ref 38–126)
Anion gap: 11 (ref 5–15)
BUN: 18 mg/dL (ref 8–23)
CO2: 25 mmol/L (ref 22–32)
Calcium: 9.1 mg/dL (ref 8.9–10.3)
Chloride: 97 mmol/L — ABNORMAL LOW (ref 98–111)
Creatinine, Ser: 1.16 mg/dL — ABNORMAL HIGH (ref 0.44–1.00)
GFR calc Af Amer: 51 mL/min — ABNORMAL LOW (ref >60)
GFR calc non Af Amer: 44 mL/min — ABNORMAL LOW (ref >60)
Glucose, Bld: 101 mg/dL — ABNORMAL HIGH (ref 70–99)
Potassium: 4.1 mmol/L (ref 3.5–5.1)
Sodium: 133 mmol/L — ABNORMAL LOW (ref 135–145)
Total Bilirubin: 0.3 mg/dL (ref 0.3–1.2)
Total Protein: 6.8 g/dL (ref 6.5–8.1)

## 2017-12-16 LAB — APTT: aPTT: 40 seconds — ABNORMAL HIGH (ref 24–36)

## 2017-12-16 LAB — CBC
HCT: 33 % — ABNORMAL LOW (ref 36.0–46.0)
Hemoglobin: 9.7 g/dL — ABNORMAL LOW (ref 12.0–15.0)
MCH: 24.1 pg — ABNORMAL LOW (ref 26.0–34.0)
MCHC: 29.4 g/dL — ABNORMAL LOW (ref 30.0–36.0)
MCV: 81.9 fL (ref 80.0–100.0)
Platelets: 348 10*3/uL (ref 150–400)
RBC: 4.03 MIL/uL (ref 3.87–5.11)
RDW: 16.6 % — ABNORMAL HIGH (ref 11.5–15.5)
WBC: 13.4 10*3/uL — ABNORMAL HIGH (ref 4.0–10.5)
nRBC: 0 % (ref 0.0–0.2)

## 2017-12-16 LAB — I-STAT TROPONIN, ED: Troponin i, poc: 0.01 ng/mL (ref 0.00–0.08)

## 2017-12-16 LAB — I-STAT CHEM 8, ED
BUN: 20 mg/dL (ref 8–23)
Calcium, Ion: 1.06 mmol/L — ABNORMAL LOW (ref 1.15–1.40)
Chloride: 96 mmol/L — ABNORMAL LOW (ref 98–111)
Creatinine, Ser: 1.1 mg/dL — ABNORMAL HIGH (ref 0.44–1.00)
Glucose, Bld: 104 mg/dL — ABNORMAL HIGH (ref 70–99)
HCT: 31 % — ABNORMAL LOW (ref 36.0–46.0)
Hemoglobin: 10.5 g/dL — ABNORMAL LOW (ref 12.0–15.0)
Potassium: 3.8 mmol/L (ref 3.5–5.1)
Sodium: 132 mmol/L — ABNORMAL LOW (ref 135–145)
TCO2: 26 mmol/L (ref 22–32)

## 2017-12-16 LAB — PROTIME-INR
INR: 1.11
Prothrombin Time: 14.2 seconds (ref 11.4–15.2)

## 2017-12-16 NOTE — ED Triage Notes (Signed)
Pt reports dizziness for over 1 week. Pt reports some lightheadedness. Pt also reports intermittent headache. Pt reports she was recently started on an antibiotic for groin swelling/redness.

## 2017-12-16 NOTE — ED Provider Notes (Cosign Needed)
Patient placed in Quick Look pathway, seen and evaluated   Chief Complaint: dizziness  HPI: JAHNYA TRINDADE is a 77 y.o. female who presents to the ED for dizziness that started one week ago. Patient also c/o headaches off and on. Patient recently finished antibiotics for infection in groin.   ROS: Neuro: headache, dizziness  Physical Exam:  BP 110/61   Pulse 86   Temp 97.8 F (36.6 C) (Oral)   Resp 18   SpO2 98%    Gen: No distress  Neuro: Awake and Alert. Grips are equal, no pronator drift.  Skin: Warm and dry  Heart: regular rate and rhythm  Lungs: clear  Initiation of care has begun. The patient has been counseled on the process, plan, and necessity for staying for the completion/evaluation, and the remainder of the medical screening examination    Ashley Murrain, NP 12/16/17 1830

## 2017-12-16 NOTE — Unmapped (Signed)
Pt DIl called and stated that she is having dizziness and stomach issues after starting meds that were prescribed

## 2017-12-17 DIAGNOSIS — I10 Essential (primary) hypertension: Secondary | ICD-10-CM | POA: Diagnosis present

## 2017-12-17 DIAGNOSIS — R195 Other fecal abnormalities: Secondary | ICD-10-CM | POA: Diagnosis present

## 2017-12-17 LAB — CBC WITH DIFFERENTIAL/PLATELET
Abs Immature Granulocytes: 0.04 10*3/uL (ref 0.00–0.07)
BASOS PCT: 0 %
Basophils Absolute: 0 10*3/uL (ref 0.0–0.1)
EOS PCT: 1 %
Eosinophils Absolute: 0.1 10*3/uL (ref 0.0–0.5)
HCT: 31.6 % — ABNORMAL LOW (ref 36.0–46.0)
Hemoglobin: 9.5 g/dL — ABNORMAL LOW (ref 12.0–15.0)
Immature Granulocytes: 0 %
Lymphocytes Relative: 13 %
Lymphs Abs: 1.2 10*3/uL (ref 0.7–4.0)
MCH: 25.1 pg — AB (ref 26.0–34.0)
MCHC: 30.1 g/dL (ref 30.0–36.0)
MCV: 83.4 fL (ref 80.0–100.0)
MONO ABS: 0.8 10*3/uL (ref 0.1–1.0)
Monocytes Relative: 8 %
Neutro Abs: 7.3 10*3/uL (ref 1.7–7.7)
Neutrophils Relative %: 78 %
PLATELETS: 256 10*3/uL (ref 150–400)
RBC: 3.79 MIL/uL — AB (ref 3.87–5.11)
RDW: 16.5 % — ABNORMAL HIGH (ref 11.5–15.5)
WBC: 9.4 10*3/uL (ref 4.0–10.5)
nRBC: 0 % (ref 0.0–0.2)

## 2017-12-17 LAB — POC OCCULT BLOOD, ED: FECAL OCCULT BLD: POSITIVE — AB

## 2017-12-17 LAB — COMPREHENSIVE METABOLIC PANEL
ALK PHOS: 50 U/L (ref 38–126)
ALT: 10 U/L (ref 0–44)
AST: 12 U/L — ABNORMAL LOW (ref 15–41)
Albumin: 2.8 g/dL — ABNORMAL LOW (ref 3.5–5.0)
Anion gap: 8 (ref 5–15)
BUN: 15 mg/dL (ref 8–23)
CALCIUM: 8.7 mg/dL — AB (ref 8.9–10.3)
CHLORIDE: 99 mmol/L (ref 98–111)
CO2: 28 mmol/L (ref 22–32)
Creatinine, Ser: 1.01 mg/dL — ABNORMAL HIGH (ref 0.44–1.00)
GFR calc non Af Amer: 52 mL/min — ABNORMAL LOW (ref >60)
Glucose, Bld: 144 mg/dL — ABNORMAL HIGH (ref 70–99)
Potassium: 3.4 mmol/L — ABNORMAL LOW (ref 3.5–5.1)
SODIUM: 135 mmol/L (ref 135–145)
Total Bilirubin: 0.5 mg/dL (ref 0.3–1.2)
Total Protein: 6.4 g/dL — ABNORMAL LOW (ref 6.5–8.1)

## 2017-12-17 LAB — MAGNESIUM: Magnesium: 1.8 mg/dL (ref 1.7–2.4)

## 2017-12-17 LAB — PHOSPHORUS: PHOSPHORUS: 3.2 mg/dL (ref 2.5–4.6)

## 2017-12-17 MED ORDER — HYDROCHLOROTHIAZIDE 25 MG PO TABS
25.0000 mg | ORAL_TABLET | Freq: Every day | ORAL | Status: DC
  Filled 2017-12-17: qty 1

## 2017-12-17 MED ORDER — ACETAMINOPHEN 650 MG RE SUPP: 650.0000 mg | Freq: Four times a day (QID) | RECTAL | Status: DC | PRN

## 2017-12-17 MED ORDER — METOPROLOL TARTRATE 25 MG PO TABS: 50.0000 mg | ORAL_TABLET | Freq: Two times a day (BID) | ORAL | Status: DC

## 2017-12-17 MED ORDER — METFORMIN HCL 500 MG PO TABS: 500.0000 mg | ORAL_TABLET | Freq: Every day | ORAL | Status: DC

## 2017-12-17 MED ORDER — OXYBUTYNIN CHLORIDE ER 5 MG PO TB24: 5.0000 mg | ORAL_TABLET | Freq: Every day | ORAL | Status: DC

## 2017-12-17 MED ORDER — ACETAMINOPHEN 325 MG PO TABS: 650.0000 mg | ORAL_TABLET | Freq: Four times a day (QID) | ORAL | Status: DC | PRN

## 2017-12-17 MED ORDER — FINASTERIDE 5 MG PO TABS
5.0000 mg | ORAL_TABLET | Freq: Every day | ORAL | Status: DC
  Filled 2017-12-17: qty 1

## 2017-12-17 MED ORDER — ONDANSETRON HCL 4 MG/2ML IJ SOLN: 4.0000 mg | Freq: Four times a day (QID) | INTRAMUSCULAR | Status: DC | PRN

## 2017-12-17 MED ORDER — AMLODIPINE BESYLATE 5 MG PO TABS
10.0000 mg | ORAL_TABLET | Freq: Every day | ORAL | Status: DC
  Filled 2017-12-17: qty 2

## 2017-12-17 MED ORDER — DOCUSATE SODIUM 50 MG/5ML PO LIQD: 50.0000 mg | Freq: Every day | ORAL | Status: DC

## 2017-12-17 MED ORDER — NICOTINE 21 MG/24HR TD PT24
21.0000 mg | MEDICATED_PATCH | Freq: Once | TRANSDERMAL | Status: DC
  Administered 2017-12-17: 21 mg via TRANSDERMAL
  Filled 2017-12-17: qty 1

## 2017-12-17 MED ORDER — POTASSIUM CHLORIDE IN NACL 40-0.9 MEQ/L-% IV SOLN: INTRAVENOUS | Status: DC

## 2017-12-17 MED ORDER — SODIUM CHLORIDE 0.9 % IV BOLUS
500.0000 mL | Freq: Once | INTRAVENOUS | Status: AC
  Administered 2017-12-17: 500 mL via INTRAVENOUS

## 2017-12-17 MED ORDER — ATORVASTATIN CALCIUM 20 MG PO TABS
40.0000 mg | ORAL_TABLET | Freq: Every day | ORAL | Status: DC
  Filled 2017-12-17: qty 2

## 2017-12-17 MED ORDER — ONDANSETRON HCL 4 MG PO TABS: 4.0000 mg | ORAL_TABLET | Freq: Four times a day (QID) | ORAL | Status: DC | PRN

## 2017-12-17 NOTE — ED Notes (Signed)
[phone] Dorian Pod (daughter-in-law) ok to share information with her per pt.

## 2017-12-17 NOTE — Progress Notes (Signed)
[facility] ER team 1 note.  The patient declined to be admitted to the hospital AMA. This was discussed over the phone with her daughter in law. She was unable to convince her to stay. I advised her to follow up with their PCP as soon as they can or a gastroenterologist. Buyer, retail and ER provider notified.  Tennis Must, MD

## 2017-12-17 NOTE — ED Notes (Signed)
Pt refused medications and informed this RN that she wanted her IV removed and wished to leave AMA. Admitting MD informed earlier of situation. MD paged again for current status.

## 2017-12-17 NOTE — ED Notes (Signed)
Got patient on the monitor patient is resting with call bell in reach  ?
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
## 2017-12-17 NOTE — ED Notes (Signed)
Walked patient to the bathroom patient did well 

## 2017-12-17 NOTE — ED Notes (Addendum)
Pt left AMA. Admitting provider notified. Opportunity for questioning and answers were provided. Armband removed by staff, pt discharged from ED. IV removed (intact) before departure.

## 2017-12-18 ENCOUNTER — Encounter (HOSPITAL_COMMUNITY): Payer: Self-pay | Admitting: Emergency Medicine

## 2017-12-18 ENCOUNTER — Other Ambulatory Visit: Payer: Self-pay

## 2017-12-18 ENCOUNTER — Emergency Department (HOSPITAL_COMMUNITY): Payer: Medicare PPO

## 2017-12-18 ENCOUNTER — Observation Stay (HOSPITAL_COMMUNITY)
Admission: EM | Admit: 2017-12-18 | Discharge: 2017-12-20 | Disposition: A | Discharge status: Home / Self Care | Payer: Medicare PPO | Attending: Internal Medicine | Admitting: Internal Medicine

## 2017-12-18 DIAGNOSIS — F419 Anxiety disorder, unspecified: Secondary | ICD-10-CM | POA: Diagnosis not present

## 2017-12-18 DIAGNOSIS — Z7984 Long term (current) use of oral hypoglycemic drugs: Secondary | ICD-10-CM | POA: Insufficient documentation

## 2017-12-18 DIAGNOSIS — E876 Hypokalemia: Secondary | ICD-10-CM | POA: Insufficient documentation

## 2017-12-18 DIAGNOSIS — N3001 Acute cystitis with hematuria: Secondary | ICD-10-CM | POA: Diagnosis present

## 2017-12-18 DIAGNOSIS — E119 Type 2 diabetes mellitus without complications: Secondary | ICD-10-CM | POA: Diagnosis not present

## 2017-12-18 DIAGNOSIS — D649 Anemia, unspecified: Secondary | ICD-10-CM | POA: Diagnosis not present

## 2017-12-18 DIAGNOSIS — Z6836 Body mass index (BMI) 36.0-36.9, adult: Secondary | ICD-10-CM | POA: Insufficient documentation

## 2017-12-18 DIAGNOSIS — Z7982 Long term (current) use of aspirin: Secondary | ICD-10-CM | POA: Insufficient documentation

## 2017-12-18 DIAGNOSIS — K219 Gastro-esophageal reflux disease without esophagitis: Secondary | ICD-10-CM | POA: Insufficient documentation

## 2017-12-18 DIAGNOSIS — Z8673 Personal history of transient ischemic attack (TIA), and cerebral infarction without residual deficits: Secondary | ICD-10-CM | POA: Diagnosis not present

## 2017-12-18 DIAGNOSIS — F17210 Nicotine dependence, cigarettes, uncomplicated: Secondary | ICD-10-CM | POA: Diagnosis not present

## 2017-12-18 DIAGNOSIS — N390 Urinary tract infection, site not specified: Secondary | ICD-10-CM | POA: Diagnosis present

## 2017-12-18 DIAGNOSIS — L732 Hidradenitis suppurativa: Secondary | ICD-10-CM | POA: Diagnosis present

## 2017-12-18 DIAGNOSIS — E785 Hyperlipidemia, unspecified: Secondary | ICD-10-CM | POA: Insufficient documentation

## 2017-12-18 DIAGNOSIS — I10 Essential (primary) hypertension: Secondary | ICD-10-CM | POA: Diagnosis present

## 2017-12-18 DIAGNOSIS — Z7902 Long term (current) use of antithrombotics/antiplatelets: Secondary | ICD-10-CM | POA: Insufficient documentation

## 2017-12-18 DIAGNOSIS — E860 Dehydration: Secondary | ICD-10-CM | POA: Insufficient documentation

## 2017-12-18 DIAGNOSIS — Z882 Allergy status to sulfonamides status: Secondary | ICD-10-CM | POA: Diagnosis not present

## 2017-12-18 DIAGNOSIS — I6521 Occlusion and stenosis of right carotid artery: Secondary | ICD-10-CM | POA: Diagnosis not present

## 2017-12-18 DIAGNOSIS — E669 Obesity, unspecified: Secondary | ICD-10-CM | POA: Diagnosis not present

## 2017-12-18 DIAGNOSIS — I159 Secondary hypertension, unspecified: Secondary | ICD-10-CM | POA: Diagnosis not present

## 2017-12-18 DIAGNOSIS — E871 Hypo-osmolality and hyponatremia: Secondary | ICD-10-CM | POA: Diagnosis not present

## 2017-12-18 LAB — URINALYSIS, ROUTINE W REFLEX MICROSCOPIC
Bilirubin Urine: NEGATIVE
Glucose, UA: NEGATIVE mg/dL
Ketones, ur: NEGATIVE mg/dL
NITRITE: NEGATIVE
Protein, ur: NEGATIVE mg/dL
SPECIFIC GRAVITY, URINE: 1.009 (ref 1.005–1.030)
pH: 6 (ref 5.0–8.0)

## 2017-12-18 LAB — BASIC METABOLIC PANEL
Anion gap: 12 (ref 5–15)
BUN: 15 mg/dL (ref 8–23)
CALCIUM: 8.9 mg/dL (ref 8.9–10.3)
CO2: 25 mmol/L (ref 22–32)
CREATININE: 1 mg/dL (ref 0.44–1.00)
Chloride: 97 mmol/L — ABNORMAL LOW (ref 98–111)
GFR calc Af Amer: >60 mL/min (ref >60)
GFR calc non Af Amer: 53 mL/min — ABNORMAL LOW (ref >60)
Glucose, Bld: 249 mg/dL — ABNORMAL HIGH (ref 70–99)
Potassium: 2.8 mmol/L — ABNORMAL LOW (ref 3.5–5.1)
SODIUM: 134 mmol/L — AB (ref 135–145)

## 2017-12-18 LAB — CBC
HCT: 33.8 % — ABNORMAL LOW (ref 36.0–46.0)
Hemoglobin: 10.4 g/dL — ABNORMAL LOW (ref 12.0–15.0)
MCH: 25.1 pg — ABNORMAL LOW (ref 26.0–34.0)
MCHC: 30.8 g/dL (ref 30.0–36.0)
MCV: 81.6 fL (ref 80.0–100.0)
PLATELETS: 328 10*3/uL (ref 150–400)
RBC: 4.14 MIL/uL (ref 3.87–5.11)
RDW: 16.8 % — AB (ref 11.5–15.5)
WBC: 11.9 10*3/uL — AB (ref 4.0–10.5)
nRBC: 0 % (ref 0.0–0.2)

## 2017-12-18 LAB — CBG MONITORING, ED: GLUCOSE-CAPILLARY: 87 mg/dL (ref 70–99)

## 2017-12-18 LAB — MAGNESIUM: Magnesium: 2 mg/dL (ref 1.7–2.4)

## 2017-12-18 MED ORDER — FINASTERIDE 5 MG PO TABS
5.0000 mg | ORAL_TABLET | Freq: Every day | ORAL | Status: DC
  Administered 2017-12-18 – 2017-12-20 (×3): 5 mg via ORAL
  Filled 2017-12-18 (×3): qty 1

## 2017-12-18 MED ORDER — SODIUM CHLORIDE 0.9 % IV BOLUS
1000.0000 mL | Freq: Once | INTRAVENOUS | Status: AC
  Administered 2017-12-18: 1000 mL via INTRAVENOUS

## 2017-12-18 MED ORDER — SODIUM CHLORIDE 0.9 % IV SOLN
1.0000 g | Freq: Once | INTRAVENOUS | Status: AC
  Administered 2017-12-18: 1 g via INTRAVENOUS
  Filled 2017-12-18: qty 10

## 2017-12-18 MED ORDER — VANCOMYCIN HCL 10 G IV SOLR
1500.0000 mg | Freq: Once | INTRAVENOUS | Status: AC
  Administered 2017-12-18: 1500 mg via INTRAVENOUS
  Filled 2017-12-18: qty 1500

## 2017-12-18 MED ORDER — CLOPIDOGREL BISULFATE 75 MG PO TABS
75.0000 mg | ORAL_TABLET | Freq: Every day | ORAL | Status: DC
  Administered 2017-12-18 – 2017-12-20 (×3): 75 mg via ORAL
  Filled 2017-12-18 (×3): qty 1

## 2017-12-18 MED ORDER — INSULIN ASPART 100 UNIT/ML ~~LOC~~ SOLN
0.0000 [IU] | Freq: Three times a day (TID) | SUBCUTANEOUS | Status: DC
  Administered 2017-12-19 (×2): 1 [IU] via SUBCUTANEOUS
  Administered 2017-12-19: 2 [IU] via SUBCUTANEOUS
  Filled 2017-12-18 (×4): qty 1

## 2017-12-18 MED ORDER — SODIUM CHLORIDE 0.9 % IV SOLN
1.0000 g | INTRAVENOUS | Status: DC
  Administered 2017-12-19 – 2017-12-20 (×2): 1 g via INTRAVENOUS
  Filled 2017-12-18 (×2): qty 10

## 2017-12-18 MED ORDER — POTASSIUM CHLORIDE CRYS ER 20 MEQ PO TBCR
40.0000 meq | EXTENDED_RELEASE_TABLET | Freq: Once | ORAL | Status: AC
  Administered 2017-12-18: 40 meq via ORAL
  Filled 2017-12-18: qty 2

## 2017-12-18 MED ORDER — SODIUM CHLORIDE 0.9 % IV SOLN
INTRAVENOUS | Status: AC
  Administered 2017-12-18 – 2017-12-19 (×2): via INTRAVENOUS
  Administered 2017-12-19: 100 mL/h via INTRAVENOUS

## 2017-12-18 MED ORDER — VANCOMYCIN HCL 10 G IV SOLR
1500.0000 mg | INTRAVENOUS | Status: DC
  Administered 2017-12-19: 1500 mg via INTRAVENOUS
  Filled 2017-12-18 (×2): qty 1500

## 2017-12-18 MED ORDER — ASPIRIN EC 81 MG PO TBEC
81.0000 mg | DELAYED_RELEASE_TABLET | Freq: Every day | ORAL | Status: DC
  Administered 2017-12-18 – 2017-12-20 (×3): 81 mg via ORAL
  Filled 2017-12-18 (×3): qty 1

## 2017-12-18 MED ORDER — METOPROLOL TARTRATE 25 MG PO TABS
25.0000 mg | ORAL_TABLET | Freq: Two times a day (BID) | ORAL | Status: DC
  Administered 2017-12-18 – 2017-12-19 (×2): 25 mg via ORAL
  Filled 2017-12-18 (×2): qty 1

## 2017-12-18 MED ORDER — POTASSIUM CHLORIDE 10 MEQ/100ML IV SOLN
10.0000 meq | Freq: Once | INTRAVENOUS | Status: AC
  Administered 2017-12-18: 10 meq via INTRAVENOUS
  Filled 2017-12-18: qty 100

## 2017-12-18 MED ORDER — ATORVASTATIN CALCIUM 40 MG PO TABS
40.0000 mg | ORAL_TABLET | Freq: Every day | ORAL | Status: DC
  Administered 2017-12-18 – 2017-12-20 (×3): 40 mg via ORAL
  Filled 2017-12-18 (×2): qty 1
  Filled 2017-12-18: qty 2

## 2017-12-18 MED ORDER — OXYBUTYNIN CHLORIDE ER 5 MG PO TB24
5.0000 mg | ORAL_TABLET | Freq: Every day | ORAL | Status: DC
  Administered 2017-12-18 – 2017-12-20 (×3): 5 mg via ORAL
  Filled 2017-12-18 (×3): qty 1

## 2017-12-18 MED ORDER — HYDRALAZINE HCL 20 MG/ML IJ SOLN
5.0000 mg | INTRAMUSCULAR | Status: DC | PRN
  Administered 2017-12-18: 5 mg via INTRAVENOUS
  Filled 2017-12-18: qty 1

## 2017-12-18 NOTE — Progress Notes (Signed)
Pharmacy Antibiotic Note  Melinda Eaton is a 77 y.o. female admitted on 12/18/2017 with cellulitis.  Pharmacy has been consulted for vancomycin dosing. WBC 11.9. SCr 1. CrCl ~ 40-45 mL/min   Plan: -Vancomycin 1500 mg IV Q 24 hours  -Ceftriaxone per MD for UTI -Monitor CBC, renal fx, cultures and clinical progress -VT at SS   Height: 5\' 2"  (157.5 cm) IBW/kg (Calculated) : 50.1  Temp (24hrs), Avg:98.9 F (37.2 C), Min:98.9 F (37.2 C), Max:98.9 F (37.2 C)  Recent Labs  Lab 12/16/17 1823 12/16/17 1850 12/17/17 0839 12/18/17 1135  WBC 13.4*  --  9.4 11.9*  CREATININE 1.16* 1.10* 1.01* 1.00    CrCl cannot be calculated (Unknown ideal weight.).    Allergies  Allergen Reactions  . Bactrim [Sulfamethoxazole-Trimethoprim] Other (See Comments)    hepatitis  . Doxycycline Other (See Comments)    Headache     Antimicrobials this admission: Vanc 10/27 >>  Ceftriaxone 10/27 >>   Dose adjustments this admission:   Microbiology results: 10/27 UCx>>  Thank you for allowing pharmacy to be a part of this patient's care.  Albertina Parr, PharmD., BCPS Clinical Pharmacist Clinical phone for 12/18/17 until 10pm: [phone]
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
## 2017-12-18 NOTE — ED Notes (Signed)
Patient transported to X-ray 

## 2017-12-18 NOTE — ED Triage Notes (Signed)
Daughter in law stated, she was here 2 days ago for dizziness and light headedness since wed. Was seen and discharged and it seems to be worse.

## 2017-12-19 DIAGNOSIS — I159 Secondary hypertension, unspecified: Secondary | ICD-10-CM | POA: Diagnosis not present

## 2017-12-19 DIAGNOSIS — L732 Hidradenitis suppurativa: Secondary | ICD-10-CM

## 2017-12-19 DIAGNOSIS — N3000 Acute cystitis without hematuria: Secondary | ICD-10-CM

## 2017-12-19 LAB — CBC
HEMATOCRIT: 31.2 % — AB (ref 36.0–46.0)
HEMOGLOBIN: 9.7 g/dL — AB (ref 12.0–15.0)
MCH: 25.1 pg — AB (ref 26.0–34.0)
MCHC: 31.1 g/dL (ref 30.0–36.0)
MCV: 80.8 fL (ref 80.0–100.0)
Platelets: 301 10*3/uL (ref 150–400)
RBC: 3.86 MIL/uL — ABNORMAL LOW (ref 3.87–5.11)
RDW: 17 % — AB (ref 11.5–15.5)
WBC: 8.1 10*3/uL (ref 4.0–10.5)
nRBC: 0 % (ref 0.0–0.2)

## 2017-12-19 LAB — GLUCOSE, CAPILLARY
GLUCOSE-CAPILLARY: 128 mg/dL — AB (ref 70–99)
GLUCOSE-CAPILLARY: 137 mg/dL — AB (ref 70–99)
Glucose-Capillary: 134 mg/dL — ABNORMAL HIGH (ref 70–99)
Glucose-Capillary: 160 mg/dL — ABNORMAL HIGH (ref 70–99)
Glucose-Capillary: 135 mg/dL — ABNORMAL HIGH (ref 70–99)

## 2017-12-19 LAB — OCCULT BLOOD X 1 CARD TO LAB, STOOL: FECAL OCCULT BLD: POSITIVE — AB

## 2017-12-19 LAB — COMPREHENSIVE METABOLIC PANEL
ALBUMIN: 2.7 g/dL — AB (ref 3.5–5.0)
ALT: 10 U/L (ref 0–44)
AST: 12 U/L — AB (ref 15–41)
Alkaline Phosphatase: 49 U/L (ref 38–126)
Anion gap: 7 (ref 5–15)
BILIRUBIN TOTAL: 0.5 mg/dL (ref 0.3–1.2)
BUN: 8 mg/dL (ref 8–23)
CHLORIDE: 107 mmol/L (ref 98–111)
CO2: 24 mmol/L (ref 22–32)
CREATININE: 0.73 mg/dL (ref 0.44–1.00)
Calcium: 8.4 mg/dL — ABNORMAL LOW (ref 8.9–10.3)
GFR calc Af Amer: >60 mL/min (ref >60)
GLUCOSE: 128 mg/dL — AB (ref 70–99)
POTASSIUM: 3.6 mmol/L (ref 3.5–5.1)
Sodium: 138 mmol/L (ref 135–145)
TOTAL PROTEIN: 6.1 g/dL — AB (ref 6.5–8.1)

## 2017-12-19 LAB — HEMOGLOBIN A1C
Hgb A1c MFr Bld: 6.4 % — ABNORMAL HIGH (ref 4.8–5.6)
MEAN PLASMA GLUCOSE: 136.98 mg/dL

## 2017-12-19 MED ORDER — SODIUM CHLORIDE 0.9 % IV SOLN
INTRAVENOUS | Status: DC
  Administered 2017-12-19 – 2017-12-20 (×2): via INTRAVENOUS

## 2017-12-19 MED ORDER — METOPROLOL TARTRATE 50 MG PO TABS
50.0000 mg | ORAL_TABLET | Freq: Two times a day (BID) | ORAL | Status: DC
  Administered 2017-12-19 – 2017-12-20 (×2): 50 mg via ORAL
  Filled 2017-12-19 (×2): qty 1

## 2017-12-19 MED ORDER — NICOTINE 14 MG/24HR TD PT24
14.0000 mg | MEDICATED_PATCH | Freq: Every day | TRANSDERMAL | Status: DC
  Administered 2017-12-19 – 2017-12-20 (×2): 14 mg via TRANSDERMAL
  Filled 2017-12-19 (×2): qty 1

## 2017-12-19 NOTE — Progress Notes (Signed)
Progress Note    Melinda Eaton  WUX:[id number] DOB: 03/25/1940  DOA: 12/18/2017 PCP: Christain Sacramento, MD    Brief Narrative:     Medical records reviewed and are as summarized below:  Melinda Eaton is an 77 y.o. female with medical history significant of hidradenitis suppurativa, hypertension, prior CVA on aspirin and Plavix, GERD, presents to the hospital with chief complaint of lightheadedness, she tells me is been going on for a month and a half.  She has been getting worse, and now when she tries to stand up she was extremely lightheaded and needs to sit down.  She was seen and evaluated by her PCP and tells me that her blood pressure medications were adjusted.  She continues to feel poorly and decided to come to the emergency room.   Says her symptoms began when she started taking metformin.   Assessment/Plan:   Active Problems:   Hidradenitis suppurativa   Hypertension   UTI (urinary tract infection)  Urinary tract infection -Possibly contributing to her lightheadedness -on IV abx but unfortunately it seems no culture was sent  Hypokalemia/hyponatremia -replete  Decreased PO intake -improved off metformin -HgbA1c: 6.4 --outpatient follow up  Dehydration -responded well to IVF  Lightheadedness with positive orthostatics -s/p IVF and recheck orthos in AM -orthos on 10/18 were positive  Hidradenitis suppurativa -Cannot exclude local area of cellulitis, continue ceftriaxone as above and add vancomycin -Recently seen at [facility] by Dr. Dossie Der, and placed on Levaquin.  She used to be on clindamycin.  Normocytic anemia -fecal occult test is positive however she clinically has no symptoms of bleeding, no BRBPR, melena, her skin lesions around the genital and anal area have been bleeding  -trend CBC  HTN -BP improved with hydration  Hyperlipidemia -Continue statin  Prior stroke -Continue dual antiplatelet therapy with aspirin and  Plavix.  obesity Body mass index is 36.21 kg/m.  Tobacco abuse -encourage cessation   Family Communication/Anticipated D/C date and plan/Code Status   DVT prophylaxis: scd Code Status: Full Code.  Family Communication: none at bedside Disposition Plan: pending   Medical Consultants:    None.    Subjective:   Still with some dizziness but appetite better since she stopped her metformin  Objective:    Vitals:   12/19/17 0009 12/19/17 0600 12/19/17 1059 12/19/17 1154  BP: (!) 144/112 (!) 176/88 (!) 157/67 (!) 154/63  Pulse: 77 94 84 81  Resp: 18 18  17   Temp: 98.6 F (37 C) 98.6 F (37 C)  98.7 F (37.1 C)  TempSrc: Oral Oral  Oral  SpO2: 97%   100%  Height:        Intake/Output Summary (Last 24 hours) at 12/19/2017 1532 Last data filed at 12/19/2017 0400 Gross per 24 hour  Intake 1755.49 ml  Output 500 ml  Net 1255.49 ml   There were no vitals filed for this visit.  Exam: In bed, NAD rrr  no increased work of breathing In groin are large moist areas of wound consistent with hidradenitis suppurativa  Data Reviewed:   I have personally reviewed following labs and imaging studies:  Labs: Labs show the following:   Basic Metabolic Panel: Recent Labs  Lab 12/16/17 1823 12/16/17 1850 12/17/17 0839 12/18/17 1135 12/19/17 0649  NA 133* 132* 135 134* 138  K 4.1 3.8 3.4* 2.8* 3.6  CL 97* 96* 99 97* 107  CO2 25  --  28 25 24   GLUCOSE 101* 104* 144* 249* 128*  BUN 18 20 15 15 8   CREATININE 1.16* 1.10* 1.01* 1.00 0.73  CALCIUM 9.1  --  8.7* 8.9 8.4*  MG  --   --  1.8 2.0  --   PHOS  --   --  3.2  --   --    GFR CrCl cannot be calculated (Unknown ideal weight.). Liver Function Tests: Recent Labs  Lab 12/16/17 1823 12/17/17 0839 12/19/17 0649  AST 16 12* 12*  ALT 12 10 10   ALKPHOS 49 50 49  BILITOT 0.3 0.5 0.5  PROT 6.8 6.4* 6.1*  ALBUMIN 3.1* 2.8* 2.7*   No results for input(s): LIPASE, AMYLASE in the last 168 hours. No results  for input(s): AMMONIA in the last 168 hours. Coagulation profile Recent Labs  Lab 12/16/17 1823  INR 1.11    CBC: Recent Labs  Lab 12/16/17 1823 12/16/17 1850 12/17/17 0839 12/18/17 1135 12/19/17 0649  WBC 13.4*  --  9.4 11.9* 8.1  NEUTROABS 10.6*  --  7.3  --   --   HGB 9.7* 10.5* 9.5* 10.4* 9.7*  HCT 33.0* 31.0* 31.6* 33.8* 31.2*  MCV 81.9  --  83.4 81.6 80.8  PLT 348  --  256 328 301   Cardiac Enzymes: No results for input(s): CKTOTAL, CKMB, CKMBINDEX, TROPONINI in the last 168 hours. BNP (last 3 results) No results for input(s): PROBNP in the last 8760 hours. CBG: Recent Labs  Lab 12/18/17 1710 12/19/17 0001 12/19/17 0710 12/19/17 1119  GLUCAP 87 137* 128* 134*   D-Dimer: No results for input(s): DDIMER in the last 72 hours. Hgb A1c: Recent Labs    12/19/17 0649  HGBA1C 6.4*   Lipid Profile: No results for input(s): CHOL, HDL, LDLCALC, TRIG, CHOLHDL, LDLDIRECT in the last 72 hours. Thyroid function studies: No results for input(s): TSH, T4TOTAL, T3FREE, THYROIDAB in the last 72 hours.  Invalid input(s): FREET3 Anemia work up: No results for input(s): VITAMINB12, FOLATE, FERRITIN, TIBC, IRON, RETICCTPCT in the last 72 hours. Sepsis Labs: Recent Labs  Lab 12/16/17 1823 12/17/17 0839 12/18/17 1135 12/19/17 0649  WBC 13.4* 9.4 11.9* 8.1    Microbiology No results found for this or any previous visit (from the past 240 hour(s)).  Procedures and diagnostic studies:  Dg Chest 2 View  Result Date: 12/18/2017 CLINICAL DATA:  Dizziness x1 month EXAM: CHEST - 2 VIEW COMPARISON:  01/30/2016 FINDINGS: Lungs are clear.  No pleural effusion or pneumothorax. The heart is normal in size. Mild degenerative changes of the visualized thoracolumbar spine. IMPRESSION: Normal chest radiographs. Electronically Signed   By: Julian Hy M.D.   On: 12/18/2017 14:36    Medications:   . aspirin EC  81 mg Oral Daily  . atorvastatin  40 mg Oral Daily  .  clopidogrel  75 mg Oral Daily  . finasteride  5 mg Oral Daily  . insulin aspart  0-9 Units Subcutaneous TID WC  . metoprolol tartrate  25 mg Oral BID  . nicotine  14 mg Transdermal Daily  . oxybutynin  5 mg Oral Daily   Continuous Infusions: . sodium chloride 100 mL/hr (12/19/17 1416)  . cefTRIAXone (ROCEPHIN)  IV 1 g (12/19/17 0837)  . vancomycin       LOS: 0 days   Geradine Girt  [facility]   *Please refer to [email or web address], password [email or web address] to get updated schedule on who will round on this patient, as hospitalists switch teams weekly. If 7PM-7AM, please contact night-coverage at [email or web address], password [email or web address] for any  overnight needs.  12/19/2017, 3:32 PM

## 2017-12-19 NOTE — Progress Notes (Signed)
Inpatient Diabetes Program   AACE/ADA: New Consensus Statement on Inpatient Glycemic Control (2015)  Target Ranges:  Prepandial:   less than 140 mg/dL      Peak postprandial:   less than 180 mg/dL (1-2 hours)      Critically ill patients:  140 - 180 mg/dL   Review of Glycemic Control  Diabetes history: Impaired fasting glucose/PreDM Outpatient Diabetes medications: Metformin 500 mg Daily Current orders for Inpatient glycemic control: Novolog 0-9 units tid, Novolog 0-5 units qhs   A1c resulted in 6.4%. Spoke to patient and Daughter in law about A1c level, what it means.   Patient reports in the summer her PCP placed her on metformin after her labs were drawn. Patient's daughter in law reports patient appetite has been poor and she has lost weight since being placed on Metformin. Asked patient about her symptoms. Patient reports feeling queasy and not wanting to eat but eats to take her pills in the morning a half a slice of toast.   Counseled patient and daughter in law about consistent meal intake, supplementation. Discussed common side effects of Metformin and encouraged patient to call her PCP to try an extended release version of Metformin to get the medication switched. Told patient she would need another medication in its place.  Patient's daughter in law reports patient not being able to be physically active and due to poor PO intake, will more than likely have to let medication help with glucose management at this time.  Thanks, Tama Headings RN, MSN, BC-ADM Inpatient Diabetes Coordinator Team Pager [phone] (8a-5p)

## 2017-12-19 NOTE — Progress Notes (Signed)
SCD noted as removed and pt declines replacing them.

## 2017-12-19 NOTE — Progress Notes (Signed)
Pt rouses easily to voice and denies pain at present.

## 2017-12-19 NOTE — Progress Notes (Signed)
Dr,. Eliseo Squires at bedside to exam groin.

## 2017-12-19 NOTE — Progress Notes (Signed)
Diabetic coordinator at bedside.  

## 2017-12-19 NOTE — Care Management Obs Status (Signed)
[place] NOTIFICATION   Patient Details  Name: Melinda Eaton MRN: [id number] Date of Birth: [birth year]   Medicare Observation Status Notification Given:  Yes    Midge Minium RN, BSN, NCM-BC, ACM-RN [phone] 12/19/2017, 4:34 PM

## 2017-12-20 DIAGNOSIS — D649 Anemia, unspecified: Secondary | ICD-10-CM

## 2017-12-20 DIAGNOSIS — N3000 Acute cystitis without hematuria: Secondary | ICD-10-CM | POA: Diagnosis not present

## 2017-12-20 DIAGNOSIS — L732 Hidradenitis suppurativa: Secondary | ICD-10-CM | POA: Diagnosis not present

## 2017-12-20 LAB — GLUCOSE, CAPILLARY
Glucose-Capillary: 126 mg/dL — ABNORMAL HIGH (ref 70–99)
Glucose-Capillary: 169 mg/dL — ABNORMAL HIGH (ref 70–99)

## 2017-12-20 LAB — BASIC METABOLIC PANEL
Anion gap: 7 (ref 5–15)
BUN: 6 mg/dL — AB (ref 8–23)
CALCIUM: 7.4 mg/dL — AB (ref 8.9–10.3)
CO2: 21 mmol/L — ABNORMAL LOW (ref 22–32)
Chloride: 111 mmol/L (ref 98–111)
Creatinine, Ser: 0.61 mg/dL (ref 0.44–1.00)
GFR calc Af Amer: >60 mL/min (ref >60)
GLUCOSE: 120 mg/dL — AB (ref 70–99)
Potassium: 3 mmol/L — ABNORMAL LOW (ref 3.5–5.1)
Sodium: 139 mmol/L (ref 135–145)

## 2017-12-20 LAB — CBC
HCT: 28.4 % — ABNORMAL LOW (ref 36.0–46.0)
Hemoglobin: 8.6 g/dL — ABNORMAL LOW (ref 12.0–15.0)
MCH: 24.8 pg — AB (ref 26.0–34.0)
MCHC: 30.3 g/dL (ref 30.0–36.0)
MCV: 81.8 fL (ref 80.0–100.0)
PLATELETS: 239 10*3/uL (ref 150–400)
RBC: 3.47 MIL/uL — ABNORMAL LOW (ref 3.87–5.11)
RDW: 17.1 % — AB (ref 11.5–15.5)
WBC: 6.9 10*3/uL (ref 4.0–10.5)
nRBC: 0 % (ref 0.0–0.2)

## 2017-12-20 MED ORDER — POTASSIUM CHLORIDE CRYS ER 20 MEQ PO TBCR
40.0000 meq | EXTENDED_RELEASE_TABLET | Freq: Once | ORAL | Status: AC
  Administered 2017-12-20: 40 meq via ORAL
  Filled 2017-12-20: qty 2

## 2017-12-20 MED ORDER — ASPIRIN 81 MG PO TBEC: 81.0000 mg | DELAYED_RELEASE_TABLET | Freq: Every day | ORAL | Status: DC

## 2017-12-20 MED ORDER — AMLODIPINE BESYLATE 10 MG PO TABS
10.0000 mg | ORAL_TABLET | Freq: Every day | ORAL | Status: DC
  Administered 2017-12-20: 10 mg via ORAL
  Filled 2017-12-20: qty 1

## 2017-12-20 MED ORDER — CEPHALEXIN 500 MG PO CAPS: 500.0000 mg | ORAL_CAPSULE | Freq: Two times a day (BID) | ORAL | 0 refills | Status: DC

## 2017-12-20 MED ORDER — CEPHALEXIN 500 MG PO CAPS: 500.0000 mg | ORAL_CAPSULE | Freq: Two times a day (BID) | ORAL | Status: DC

## 2017-12-20 NOTE — Discharge Summary (Signed)
Physician Discharge Summary  ASHLEN KIGER [id number]:[id number] DOB: Aug 26, 1940 DOA: 12/18/2017  PCP: Christain Sacramento, MD  Admit date: 12/18/2017 Discharge date: 12/20/2017  Admitted From: home Discharge disposition: home   Recommendations for Outpatient Follow-Up:   1. Cbc 1 week re: anemia 2. Home health- RN/PT 3. Will need another medication or extended version of metformin for DM 4. Follow urine culture to final   Discharge Diagnosis:   Active Problems:   Hidradenitis suppurativa   Hypertension   UTI (urinary tract infection)    Discharge Condition: Improved.  Diet recommendation: Low sodium, heart healthy/DM   Wound care:  Cleanse inguinal folds with soap and water and apply Interdry Ag.   Measure and cut length of InterDry Ag+ to fit in skin folds that have skin breakdown Tuck InterDry  Ag+ fabric into skin folds in a single layer, allow for 2 inches of overhang from skin edges to allow for wicking to occur May remove to bathe; dry area thoroughly and then tuck into affected areas again Do not apply any creams or ointments when using InterDry Ag+ DO NOT THROW AWAY FOR 5 DAYS unless soiled with stool DO NOT [facility] product, this will inactivate the silver in the material  New sheet of Interdry Ag+ should be applied after 5 days of use if patient continues to have skin breakdown  Code status: Full.   History of Present Illness:   Melinda Eaton is a 77 y.o. female with medical history significant of hidradenitis suppurativa, hypertension, prior CVA on aspirin and Plavix, GERD, presents to the hospital with chief complaint of lightheadedness, she tells me is been going on for a month and a half.  She has been getting worse, and now when she tries to stand up she was extremely lightheaded and needs to sit down.  She was seen and evaluated by her PCP and tells me that her blood pressure medications were adjusted.  She continues to feel poorly and decided to come  to the emergency room.  Of note, she was evaluated yesterday, referred for admission however patient declined and decided to leave AMA.  She has a history of hidradenitis with lesions around her genital and anal area, and they have been there for quite some time.  She states that lesions have not changed much, they are quite painful and have occasional bleeding.  She denies any dysuria or increased frequency, however has noticed that its more difficult to pee in the last couple of days.  She denies any fever or chills, no abdominal pain, nausea or vomiting.  She states that she has lost "a lot of weight" in the last month due to poor appetite.  Denies any chest pain or palpitations.  There was a concern for GI bleed on yesterday's ED visit, her fecal occult was positive, patient currently denies any blood in her stool or dark stools, she states that she does have those lesions around her genital area who bleed a lot.   Hospital Course by Problem:   Urinary tract infection -Possibly contributing to her lightheadedness -on IV abx but unfortunately it seems culture sent after abx? -treat with PO keflex  Hypokalemia/hyponatremia -repleted -outpatient follow up   Decreased PO intake -improved off metformin -HgbA1c: 6.4 --outpatient follow up for medications changed  Dehydration -responded well to IVF -no longer symptomatic with orthostatics-- patient instructed on how to move positions slowly  Lightheadedness with positive orthostatics -dizziness resolved with IVF Patient feeling better and  wants to go home  Hidradenitis suppurativa -wound care RN from home health -has started intradry  Normocytic anemia -fecal occult test is positive however she clinically has no symptoms of bleeding, no BRBPR, melena,her skin lesions around the genital and analarea have been bleeding  --suspect some dilution from IVF -CBC 1 week with PCP -Fe panel at that time -GI follow up if  needed  HTN -BP improved with hydration  Hyperlipidemia -Continue statin  Prior stroke -Continue dual antiplatelet therapy with aspirin and Plavix.  obesity Body mass index is 36.21 kg/m.  Tobacco abuse -encourage cessation    Medical Consultants:      Discharge Exam:   Vitals:   12/20/17 0606 12/20/17 1225  BP:  (!) 123/56  Pulse:  88  Resp: 18 20  Temp: 97.9 F (36.6 C) 98.4 F (36.9 C)  SpO2:     Vitals:   12/19/17 1859 12/19/17 2135 12/20/17 0606 12/20/17 1225  BP: (!) 178/83 (!) 186/81  (!) 123/56  Pulse: 83 (!) 108  88  Resp: 16 16 18 20   Temp: 98.6 F (37 C) 98.2 F (36.8 C) 97.9 F (36.6 C) 98.4 F (36.9 C)  TempSrc: Oral Oral Oral Oral  SpO2: 100% 97%    Height:        General exam: Appears calm and comfortable.    The results of significant diagnostics from this hospitalization (including imaging, microbiology, ancillary and laboratory) are listed below for reference.     Procedures and Diagnostic Studies:   Dg Chest 2 View  Result Date: 12/18/2017 CLINICAL DATA:  Dizziness x1 month EXAM: CHEST - 2 VIEW COMPARISON:  01/30/2016 FINDINGS: Lungs are clear.  No pleural effusion or pneumothorax. The heart is normal in size. Mild degenerative changes of the visualized thoracolumbar spine. IMPRESSION: Normal chest radiographs. Electronically Signed   By: Julian Hy M.D.   On: 12/18/2017 14:36     Labs:   Basic Metabolic Panel: Recent Labs  Lab 12/16/17 1823 12/16/17 1850 12/17/17 0839 12/18/17 1135 12/19/17 0649 12/20/17 0717  NA 133* 132* 135 134* 138 139  K 4.1 3.8 3.4* 2.8* 3.6 3.0*  CL 97* 96* 99 97* 107 111  CO2 25  --  28 25 24  21*  GLUCOSE 101* 104* 144* 249* 128* 120*  BUN 18 20 15 15 8  6*  CREATININE 1.16* 1.10* 1.01* 1.00 0.73 0.61  CALCIUM 9.1  --  8.7* 8.9 8.4* 7.4*  MG  --   --  1.8 2.0  --   --   PHOS  --   --  3.2  --   --   --    GFR CrCl cannot be calculated (Unknown ideal weight.). Liver  Function Tests: Recent Labs  Lab 12/16/17 1823 12/17/17 0839 12/19/17 0649  AST 16 12* 12*  ALT 12 10 10   ALKPHOS 49 50 49  BILITOT 0.3 0.5 0.5  PROT 6.8 6.4* 6.1*  ALBUMIN 3.1* 2.8* 2.7*   No results for input(s): LIPASE, AMYLASE in the last 168 hours. No results for input(s): AMMONIA in the last 168 hours. Coagulation profile Recent Labs  Lab 12/16/17 1823  INR 1.11    CBC: Recent Labs  Lab 12/16/17 1823 12/16/17 1850 12/17/17 0839 12/18/17 1135 12/19/17 0649 12/20/17 0717  WBC 13.4*  --  9.4 11.9* 8.1 6.9  NEUTROABS 10.6*  --  7.3  --   --   --   HGB 9.7* 10.5* 9.5* 10.4* 9.7* 8.6*  HCT 33.0* 31.0* 31.6*  33.8* 31.2* 28.4*  MCV 81.9  --  83.4 81.6 80.8 81.8  PLT 348  --  256 328 301 239   Cardiac Enzymes: No results for input(s): CKTOTAL, CKMB, CKMBINDEX, TROPONINI in the last 168 hours. BNP: Invalid input(s): POCBNP CBG: Recent Labs  Lab 12/19/17 1119 12/19/17 1637 12/19/17 2144 12/20/17 0740 12/20/17 1224  GLUCAP 134* 160* 135* 126* 169*   D-Dimer No results for input(s): DDIMER in the last 72 hours. Hgb A1c Recent Labs    12/19/17 0649  HGBA1C 6.4*   Lipid Profile No results for input(s): CHOL, HDL, LDLCALC, TRIG, CHOLHDL, LDLDIRECT in the last 72 hours. Thyroid function studies No results for input(s): TSH, T4TOTAL, T3FREE, THYROIDAB in the last 72 hours.  Invalid input(s): FREET3 Anemia work up No results for input(s): VITAMINB12, FOLATE, FERRITIN, TIBC, IRON, RETICCTPCT in the last 72 hours. Microbiology No results found for this or any previous visit (from the past 240 hour(s)).   Discharge Instructions:   Discharge Instructions    Diet - low sodium heart healthy   Complete by:  As directed    Discharge instructions   Complete by:  As directed    Home health -RN and PT Change positions from laying to standing slowly allowing your body to have time to adjust Wound care: Cleanse inguinal folds with soap and water and apply  Interdry Ag.   Measure and cut length of InterDry Ag+ to fit in skin folds that have skin breakdown Tuck InterDry  Ag+ fabric into skin folds in a single layer, allow for 2 inches of overhang from skin edges to allow for wicking to occur May remove to bathe; dry area thoroughly and then tuck into affected areas again Do not apply any creams or ointments when using InterDry Ag+ DO NOT THROW AWAY FOR 5 DAYS unless soiled with stool DO NOT [facility] product, this will inactivate the silver in the material  New sheet of Interdry Ag+ should be applied after 5 days of use if patient continues to have skin breakdown   Increase activity slowly   Complete by:  As directed      Allergies as of 12/20/2017      Reactions   Bactrim [sulfamethoxazole-trimethoprim] Other (See Comments)   hepatitis   Doxycycline Other (See Comments)   Headache      Medication List    STOP taking these medications   clindamycin 150 MG capsule Commonly known as:  CLEOCIN   GOODSENSE ASPIRIN 325 MG tablet Generic drug:  aspirin Replaced by:  aspirin 81 MG EC tablet   hydrochlorothiazide 25 MG tablet Commonly known as:  HYDRODIURIL   metFORMIN 500 MG tablet Commonly known as:  GLUCOPHAGE   pantoprazole 20 MG tablet Commonly known as:  PROTONIX   sucralfate 1 g tablet Commonly known as:  CARAFATE     TAKE these medications   amLODipine 10 MG tablet Commonly known as:  NORVASC Take 10 mg by mouth daily.   aspirin 81 MG EC tablet Take 1 tablet (81 mg total) by mouth daily. Start taking on:  12/21/2017 Replaces:  GOODSENSE ASPIRIN 325 MG tablet   atorvastatin 40 MG tablet Commonly known as:  LIPITOR Take 40 mg by mouth daily.   cephALEXin 500 MG capsule Commonly known as:  KEFLEX Take 1 capsule (500 mg total) by mouth every 12 (twelve) hours.   clopidogrel 75 MG tablet Commonly known as:  PLAVIX Take 75 mg by mouth daily.   docusate 50 MG/5ML liquid Commonly  known as:  COLACE Take 50 mg by mouth  daily.   finasteride 5 MG tablet Commonly known as:  PROSCAR Take 5 mg by mouth daily.   metoprolol tartrate 100 MG tablet Commonly known as:  LOPRESSOR Take 50 mg by mouth 2 (two) times daily.   oxybutynin 5 MG 24 hr tablet Commonly known as:  DITROPAN-XL Take 5 mg by mouth daily.   PRESERVISION AREDS 2+MULTI VIT PO Take 2 tablets by mouth daily.      Follow-up Information    [facility] Follow up.   Specialty:  Home Health Services Why:  Home Health RN for wound care/disease management; Home Health Physical Therapy Contact information: [place] [address] [phone]        Christain Sacramento, MD Follow up in 1 week(s).   Specialty:  Family Medicine Why:  for BP check as well as labs (BMP/CBC) Contact information: [address] [place] [address] [phone]            Time coordinating discharge: 25 min  Signed:  Geradine Girt DO  [facility] 12/20/2017, 1:58 PM

## 2017-12-20 NOTE — Care Management Note (Signed)
Case Management Note  Patient Details  Name: Melinda Eaton MRN: [id number] Date of Birth: Jul 01, 1940  Subjective/Objective:   77yo female presented from home with lightheadedness. PMH: Hidradenitis suppurativa, hypertension, prior CVA                  Action/Plan: CM met with patient/daughter-in-law to discuss transitional needs. Patient lives at home with her daughter, independent with ADLs, with no DME in use PTA. PCP confirmed as: Dr. Kathryne Eriksson; pharmacy of choice: [facility]. CM discussed post transition orders for [facility] (wound care) & HHPT, with patient agreeable. HH preference provided with [facility] selected. [place] referral given to Meredeth Ide liaison, with AVS updated. Patient's daughter-in-law will provide transportation home and supervision as recommended. No further needs from CM.  Expected Discharge Date:                  Expected Discharge Plan:  [place]  In-House Referral:  NA  Discharge planning Services  CM Consult  Post Acute Care Choice:  Home Health Choice offered to:  Patient  DME Arranged:  N/A DME Agency:  NA  HH Arranged:  RN, Disease Management, PT HH Agency:  [place] [facility]  Status of Service:  In process, will continue to follow  If discussed at Long Length of Stay Meetings, dates discussed:    Additional Comments:  Midge Minium RN, BSN, NCM-BC, ACM-RN [phone] 12/20/2017, 11:14 AM

## 2017-12-20 NOTE — Evaluation (Signed)
Physical Therapy Evaluation Patient Details Name: Melinda Eaton MRN: [id number] DOB: Oct 02, 1940 Today's Date: 12/20/2017   History of Present Illness  Pt is a 77 y.o female with medical history significant of hidradenitis suppurativa and hypertension. Pt presents to the hospital with chief complaint of lightheadedness and that her blood pressure medications were adjusted  Clinical Impression  Pt admitted with above diagnosis. Pt currently with functional limitations due to the deficits listed below (see PT Problem List). Pt presents in supine, HOB elevated, and alert. Pt states that she is ready to walk with a readiness for treatment. Reports no lightheadedness or dizziness at the beginning of treatment and activity. From transferring from supine to standing, a drop of 181 to 139 was seen in systolic blood pressure. Pt was continually monitored and asked about symptoms with no increase reported. During ambulation, pt stated an occurrence of headache ~75 feet of ambulation, with no increase reported afterwards. Pt will benefit from skilled PT to increase their independence and safety with mobility to allow discharge to the venue listed below.          Follow Up Recommendations Outpatient PT;Supervision for mobility/OOB(increase functional activity tolerance/strength )    Equipment Recommendations  None recommended by PT    Recommendations for Other Services       Precautions / Restrictions Precautions Precautions: Fall Precaution Comments: History of lightheadedness, positive orthostatics   Restrictions Weight Bearing Restrictions: No      Mobility  Bed Mobility Overal bed mobility: Independent                Transfers Overall transfer level: Modified independent Equipment used: None Transfers: Sit to/from Stand Sit to Stand: Modified independent (Device/Increase time)         General transfer comment: verbal cues given to push up from bed. (orthostatic values  monitored)  Ambulation/Gait Ambulation/Gait assistance: Supervision(IV pole management ) Gait Distance (Feet): 150 Feet Assistive device: None Gait Pattern/deviations: Decreased step length - right;Decreased step length - left;Step-through pattern   Gait velocity interpretation: 1.31 - 2.62 ft/sec, indicative of limited community ambulator General Gait Details: Pt required assistance for movement of IV pole. Pt required increased time taken during turns, with minimal loss of stability. Cueing given upright posture and forward gaze stability.  Stairs            Wheelchair Mobility    Modified Rankin (Stroke Patients Only)       Balance Overall balance assessment: Independent                                           Pertinent Vitals/Pain Pain Assessment: 0-10 Pain Score: 3  Pain Location: Abdomen  Pain Descriptors / Indicators: Burning;Sharp Pain Intervention(s): Monitored during session    Home Living Family/patient expects to be discharged to:: Private residence Living Arrangements: Children Available Help at Discharge: Family;Available 24 hours/day Type of Home: House Home Access: Stairs to enter Entrance Stairs-Rails: None Entrance Stairs-Number of Steps: 1 Home Layout: One level Home Equipment: None      Prior Function Level of Independence: Independent         Comments: Daughter-in-law assists with medication prep and cooking      Hand Dominance   Dominant Hand: Right    Extremity/Trunk Assessment   Upper Extremity Assessment Upper Extremity Assessment: Overall WFL for tasks assessed    Lower Extremity Assessment Lower Extremity  Assessment: Overall WFL for tasks assessed(5/5 bilaterally- hip flexion, knee extension/flexion, )    Cervical / Trunk Assessment Cervical / Trunk Assessment: Kyphotic(forward head )  Communication   Communication: No difficulties  Cognition Arousal/Alertness: Awake/alert Behavior During  Therapy: WFL for tasks assessed/performed Overall Cognitive Status: Within Functional Limits for tasks assessed                                        General Comments      Exercises     Assessment/Plan    PT Assessment Patient needs continued PT services  PT Problem List Decreased mobility;Decreased balance;Decreased coordination;Decreased strength       PT Treatment Interventions      PT Goals (Current goals can be found in the Care Plan section)  Acute Rehab PT Goals Patient Stated Goal: To increase walking distance  PT Goal Formulation: With patient Time For Goal Achievement: 01/03/18 Potential to Achieve Goals: Good    Frequency     Barriers to discharge        Co-evaluation               AM-PAC PT "6 Clicks" Daily Activity  Outcome Measure Difficulty turning over in bed (including adjusting bedclothes, sheets and blankets)?: None Difficulty moving from lying on back to sitting on the side of the bed? : None Difficulty sitting down on and standing up from a chair with arms (e.g., wheelchair, bedside commode, etc,.)?: None Help needed moving to and from a bed to chair (including a wheelchair)?: None Help needed walking in hospital room?: None Help needed climbing 3-5 steps with a railing? : A Little 6 Click Score: 23    End of Session Equipment Utilized During Treatment: Gait belt Activity Tolerance: Patient tolerated treatment well;No increased pain Patient left: in chair;with call bell/phone within reach Nurse Communication: Mobility status PT Visit Diagnosis: Other abnormalities of gait and mobility (R26.89);Unsteadiness on feet (R26.81)    Time: 6213-0865 PT Time Calculation (min) (ACUTE ONLY): 32 min   Charges:   PT Evaluation $PT Eval Moderate Complexity: 1 Mod PT Treatments $Gait Training: 8-22 mins        Wandra Feinstein, SPT Acute Rehab [phone] (pager) [phone] (office)  Thurza Kwiecinski 12/20/2017, 10:25  AM

## 2017-12-20 NOTE — Consult Note (Signed)
[facility] Nurse wound consult note Reason for Consult:Hidradenitis to bilateral inguinal folds.  Longstanding.  Has tried various topical therapies and currently using baby powder and ABD pads.  Discouraged use of baby powder due to promotion of bacterial growth.  Wound type:inflammatory Pressure Injury POA: NA Measurement: 12 cmx 1 cm x 0.2 cm nonintact inguinal folds bilaterally Wound BUL:AGTX and moist Drainage (amount, consistency, odor) moderate serosanguinous  Musty odor Periwound: Maroon discoloration Dressing procedure/placement/frequency:Cleanse inguinal folds with soap and water and apply Interdry Ag.   Measure and cut length of InterDry Ag+ to fit in skin folds that have skin breakdown Tuck InterDry  Ag+ fabric into skin folds in a single layer, allow for 2 inches of overhang from skin edges to allow for wicking to occur May remove to bathe; dry area thoroughly and then tuck into affected areas again Do not apply any creams or ointments when using InterDry Ag+ DO NOT THROW AWAY FOR 5 DAYS unless soiled with stool DO NOT [facility] product, this will inactivate the silver in the material  New sheet of Interdry Ag+ should be applied after 5 days of use if patient continues to have skin breakdown  Will not follow at this time.  Please re-consult if needed.  Domenic Moras MSN, RN, FNP-BC CWON Wound, Ostomy, Continence Nurse Pager [phone]

## 2017-12-20 NOTE — Progress Notes (Signed)
Pt sleeping at this time.

## 2017-12-21 LAB — URINE CULTURE

## 2017-12-22 NOTE — Unmapped (Signed)
Problem thought to be from metformin, has been in hospital and recovering now.  Humira paperwork being worked on by [employer] team currently.

## 2018-01-04 ENCOUNTER — Other Ambulatory Visit: Payer: Self-pay | Admitting: Gastroenterology

## 2018-01-19 ENCOUNTER — Encounter (HOSPITAL_COMMUNITY): Payer: Self-pay | Admitting: *Deleted

## 2018-01-19 ENCOUNTER — Emergency Department (HOSPITAL_COMMUNITY)
Admission: EM | Admit: 2018-01-19 | Discharge: 2018-01-19 | Disposition: A | Discharge status: Home / Self Care | Payer: Medicare PPO | Attending: Emergency Medicine | Admitting: Emergency Medicine

## 2018-01-19 ENCOUNTER — Other Ambulatory Visit: Payer: Self-pay

## 2018-01-19 DIAGNOSIS — Z7982 Long term (current) use of aspirin: Secondary | ICD-10-CM | POA: Insufficient documentation

## 2018-01-19 DIAGNOSIS — R739 Hyperglycemia, unspecified: Secondary | ICD-10-CM | POA: Diagnosis not present

## 2018-01-19 DIAGNOSIS — F17210 Nicotine dependence, cigarettes, uncomplicated: Secondary | ICD-10-CM | POA: Diagnosis not present

## 2018-01-19 DIAGNOSIS — R1030 Lower abdominal pain, unspecified: Secondary | ICD-10-CM | POA: Diagnosis not present

## 2018-01-19 DIAGNOSIS — I10 Essential (primary) hypertension: Secondary | ICD-10-CM | POA: Insufficient documentation

## 2018-01-19 DIAGNOSIS — Z79899 Other long term (current) drug therapy: Secondary | ICD-10-CM | POA: Insufficient documentation

## 2018-01-19 DIAGNOSIS — E876 Hypokalemia: Secondary | ICD-10-CM | POA: Diagnosis not present

## 2018-01-19 DIAGNOSIS — L98491 Non-pressure chronic ulcer of skin of other sites limited to breakdown of skin: Secondary | ICD-10-CM | POA: Insufficient documentation

## 2018-01-19 DIAGNOSIS — R42 Dizziness and giddiness: Secondary | ICD-10-CM | POA: Insufficient documentation

## 2018-01-19 DIAGNOSIS — K625 Hemorrhage of anus and rectum: Secondary | ICD-10-CM | POA: Diagnosis present

## 2018-01-19 DIAGNOSIS — Z7902 Long term (current) use of antithrombotics/antiplatelets: Secondary | ICD-10-CM | POA: Insufficient documentation

## 2018-01-19 LAB — COMPREHENSIVE METABOLIC PANEL
ALBUMIN: 3.3 g/dL — AB (ref 3.5–5.0)
ALT: 10 U/L (ref 0–44)
AST: 15 U/L (ref 15–41)
Alkaline Phosphatase: 50 U/L (ref 38–126)
Anion gap: 11 (ref 5–15)
BUN: 10 mg/dL (ref 8–23)
CHLORIDE: 102 mmol/L (ref 98–111)
CO2: 22 mmol/L (ref 22–32)
Calcium: 8.7 mg/dL — ABNORMAL LOW (ref 8.9–10.3)
Creatinine, Ser: 0.8 mg/dL (ref 0.44–1.00)
GFR calc Af Amer: >60 mL/min (ref >60)
GFR calc non Af Amer: >60 mL/min (ref >60)
GLUCOSE: 163 mg/dL — AB (ref 70–99)
POTASSIUM: 3 mmol/L — AB (ref 3.5–5.1)
Sodium: 135 mmol/L (ref 135–145)
Total Bilirubin: 0.5 mg/dL (ref 0.3–1.2)
Total Protein: 7.3 g/dL (ref 6.5–8.1)

## 2018-01-19 LAB — URINALYSIS, ROUTINE W REFLEX MICROSCOPIC
Bilirubin Urine: NEGATIVE
Glucose, UA: 50 mg/dL — AB
KETONES UR: 5 mg/dL — AB
Nitrite: NEGATIVE
PROTEIN: NEGATIVE mg/dL
RBC / HPF: >50 RBC/hpf — ABNORMAL HIGH (ref 0–5)
SPECIFIC GRAVITY, URINE: 1.008 (ref 1.005–1.030)
WBC, UA: >50 WBC/hpf — ABNORMAL HIGH (ref 0–5)
pH: 7 (ref 5.0–8.0)

## 2018-01-19 LAB — CBC
HCT: 37.1 % (ref 36.0–46.0)
Hemoglobin: 11.3 g/dL — ABNORMAL LOW (ref 12.0–15.0)
MCH: 25.1 pg — ABNORMAL LOW (ref 26.0–34.0)
MCHC: 30.5 g/dL (ref 30.0–36.0)
MCV: 82.4 fL (ref 80.0–100.0)
PLATELETS: 278 10*3/uL (ref 150–400)
RBC: 4.5 MIL/uL (ref 3.87–5.11)
RDW: 16.3 % — AB (ref 11.5–15.5)
WBC: 10.6 10*3/uL — ABNORMAL HIGH (ref 4.0–10.5)
nRBC: 0 % (ref 0.0–0.2)

## 2018-01-19 LAB — TYPE AND SCREEN
ABO/RH(D): A NEG
Antibody Screen: POSITIVE

## 2018-01-19 MED ORDER — POTASSIUM CHLORIDE CRYS ER 20 MEQ PO TBCR: 20.0000 meq | EXTENDED_RELEASE_TABLET | Freq: Two times a day (BID) | ORAL | 0 refills | Status: DC

## 2018-01-19 MED ORDER — POTASSIUM CHLORIDE CRYS ER 20 MEQ PO TBCR
40.0000 meq | EXTENDED_RELEASE_TABLET | Freq: Once | ORAL | Status: AC
  Administered 2018-01-19: 40 meq via ORAL
  Filled 2018-01-19: qty 2

## 2018-01-19 MED ORDER — SODIUM CHLORIDE 0.9 % IV BOLUS
1000.0000 mL | Freq: Once | INTRAVENOUS | Status: AC
  Administered 2018-01-19: 1000 mL via INTRAVENOUS

## 2018-01-19 NOTE — Discharge Instructions (Addendum)
Continue home medications Take potassium as prescribed Recheck with your doctor next week to decide if antibiotics need to be changes REturn if worse at any time

## 2018-01-19 NOTE — ED Notes (Signed)
Explained need for in and out catheter sample for accurate urine specimen and culture. Patient is currently on levaquin and states she would like to follow up with PCP since she has no symptoms of UTI.

## 2018-01-19 NOTE — ED Notes (Signed)
Patient denies pain and is resting comfortably.  
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
## 2018-01-19 NOTE — ED Triage Notes (Signed)
C/o rectal bleeding for several days states she was having lower abd. Pain last pm , states she has an appointment with Dr. Benson Norway for colonoscopy next Friday. States her last   BM was 3 days ago, describes as light pink in color

## 2018-01-20 LAB — URINE CULTURE: Culture: 70000 — AB

## 2018-01-27 ENCOUNTER — Ambulatory Visit (HOSPITAL_COMMUNITY): Admission: RE | Admit: 2018-01-27 | Payer: Medicare PPO | Source: Ambulatory Visit | Admitting: Gastroenterology

## 2018-01-27 ENCOUNTER — Encounter (HOSPITAL_COMMUNITY): Admission: RE | Payer: Self-pay | Source: Ambulatory Visit

## 2018-01-27 SURGERY — ESOPHAGOGASTRODUODENOSCOPY (EGD) WITH PROPOFOL
Anesthesia: Monitor Anesthesia Care

## 2018-03-06 ENCOUNTER — Ambulatory Visit: Admit: 2018-03-06 | Discharge: 2018-03-07 | Payer: MEDICARE

## 2018-03-06 DIAGNOSIS — L732 Hidradenitis suppurativa: Principal | ICD-10-CM

## 2018-03-06 DIAGNOSIS — Z79899 Other long term (current) drug therapy: Secondary | ICD-10-CM

## 2018-03-06 MED ORDER — PREDNISONE 10 MG TABLET
ORAL_TABLET | 0 refills | 0 days | Status: CP
Start: 2018-03-06 — End: 2018-05-01

## 2018-03-06 NOTE — Unmapped (Deleted)
Self-reported severity (0-5): 4  VAS pain today: 8  VAS average pain for the last month: 10  Requiring pain medication? Yes.  If so, what type/frequency? Tylenol  How often in pain?  continuously  Level of odor (0-5): 3  Level of itching (0-5): 3  Dressing changes needed for drainage:Several times a day  How much drainage: a lot of drainage  Flare in the last month (Y/N)? Yes.  How long ago was the last flare? in last 6 months  Developing new lesions? every day  Number of inflammatory lesions montly: 10 or more  DLQI: 17  Current treatment: Levofloxacin, metronidazole, clindamycin gel   How helpful is the current treatment in managing the following aspects of your disease?  Not at all helpful Somewhat helpful Very helpful   Pain  X    Decreasing length of flares X     Decreasing new lesions X     Drainage X     Decreasing frequency of flares X     Decreasing severity of flares X     Odor   X         location Abscess Inflamed nodule Non-inflamed nodule Draining sinus Non-draining Sinus [place] % scar   R axilla          L axilla          R inframammary          L inframammary          Intermammary          Pubic          R inguinal          R thigh          L inguinal          L thigh          Scrotum/Vulva          Perianal          R buttock          L buttock          Other (list)                      AN count (total sum of abscess and inflammatory nodule):  Pilonidal sinus? ***

## 2018-03-06 NOTE — Unmapped (Addendum)
Start prednisone taper today.    Check blood sugar in about 2 weeks.    Pharmacy will call you for Humira injection approval this week.    Hidradentis Suppurativa (pronounced ???high-drad-en-eye-tis/sup-your-uh-tee-vah???) is a chronic disease of hair follicles.  The lesions occur most commonly on areas of skin-to-skin contact: under the arms (axillary area), in the groin, around the buttocks, in the region around the anus and genitals, and on the skin between and under the breasts. In women, the underarms, groin, and breast areas are most commonly affected. Men most often have HS lesions around the anus and under the arms and may also have HS at the back of the neck and behind and around the ears.    What does HS look and feel like?   The first thing that someone with HS notices is a tender, raised, red bump that looks like an under-the-skin pimple or boil. Sometimes HS lesions have two or more ???heads.???  In mild disease only an occasional boil or abscess may occur, but in more active disease there can be many new lesions every month.  Some abscesses can become larger and may open and drain pus.  Bleeding and increased odor can also occur. In severe disease, deeper abscesses develop and may connect with each other under the skin to form tunnel-like tracts (sinuses, fistulas).  These may drain constantly, or may temporarily improve and then usually begin draining again over time.  In people who have had sinus tracts for some time, scars form that feel like ropes under the skin. In the very worst cases, networks of sinus tracts can form deeper in the body, including the muscle and other tissues. Many people with severe HS have scars that can limit their ability to freely move their arms or legs, though this is very unlikely for most patients.     Clinicians usually classify or ???grade??? HS using the [facility] staging system according to the severity of the disease for each body location:  ??? Hurley stage I: one or more abscesses are present, but no sinus tracts have formed and no scars have developed  ??? Hurley stage II: one or more abscesses are present that resolve and recur; on sinus tract can be present and scarring is seen  ??? Hurley stage III: many abscesses and more than one sinus tract is present with extensive scars.    What causes HS?  The cause of HS is not completely understood.  It seems to be a disorder of hair follicles and often many family members are affected so genetics probably play a strong role.  Bacteria are often present and may make the disease worse, but infection does not seem to be the main cause. Hormones are also likely play a role since the condition typically starts around puberty when hair follicles under the arms and in the groin start to change.  It can sometimes flare with menstrual cycles in women as well.  In most cases it lasts for decades and starts to improve to some extent in the late 30s and 40s as long as many fistulas have not already formed.  Women are three times more likely than men to develop HS.    Other factors are known to contribute to HS flaring or becoming worse, though they are likely not the main causes. The factors most commonly associated with HS include:  ??? Cigarette smoking - this is very highly linked.  Stopping smoking will likely not cure the disease, but likely is  helpful in reducing how much and how often it flares.  ??? Obesity - HS may occur even in people that are not overweight, but it is much more common in patients that are.  There is some evidence that losing weight and eating a diet low in sugars and fats may be helpful in improving hidradenitis, though this is not helpful for everyone.  Working with a nutritionist may be an important way to help with this and is something your physician can help coordinate    Hidradenitis is not contagious.  It is not caused by a problem with personal hygiene or any other activity or behavior of those with the disease.    How can your doctor help you treat your hidradenitis?  Clinicians use both medication and surgery to treat HS. The choice of treatment???or combination of treatments???is made according to an individual patient???s needs. Clinicians consider several factors in determining the most appropriate plan for therapy:  ??? Severity of disease - medications and some laser treatments are usually able to control disease best when fistulas are not present.  Fistulas typically require surgery.  ??? Extent and location of disease  ??? Chronicity (how often the lesions recur)    A number of different surgical methods have been developed that are useful for certain patients under particular circumstances. These can be done with local numbing and healing at home for some areas when disease is not too extensive with relatively brief recovery times.  In more extensive disease there may be a need for larger excisions under general anesthesia with healing time in the hospital and prolonged recovery periods for better disease control.      In addition, many medical treatments have been tried???some with more success than others. No medication is effective for all patients, and you and your doctor may have to try several different agents or combinations of agents before you find the treatment plan that works best for you.  The goals of therapy with medications that are either topical (used on the skin) or systemic (taken by mouth) are:  1. to clear the lesions or at least reduce their number and extent, and  2. to prevent new lesions from forming.  3. To reduce pain, drainage, and odor  Some of the types of medications commonly used are antibacterial skin washes and the topical antibiotics to prevent secondary infections and corticosteroid injections into the lesions to reduce inflammation.     Other medications that may be used include retinoids (similar to Accutane), drugs that effect how hormones and hair follicles interact, drugs that affect your immune system (such as methotrexate, adalimumab/Humira, and Remicaid/infliximab), steroids, and oral antibiotics.    Lasers that destroy hair follicles can also be helpful since they reduce the hair follicles that cause the problems.  Multiple treatments are typically required over time and there is some discomfort associated with treatment, but it is typically very fast and well-tolerated.    It is very important to realize that hidradenitis cannot be completely cured with any single medication or surgical procedure.  It is a disease that can be very stubborn and difficult to control, but with good treatment a lot of improvement and sometimes temporary remissions can be obtained. Poorly controlled disease can cause more fistulas to form and make managing the disease much more difficult over time so it is important to seek care to reduce major flares.  Surgery can provide a long term cure in some areas, though the disease can start again  or continue in nearby areas.  A dermatologist is often the best person to help coordinate disease treatment, and sometimes other surgeons, pain specialists, other specialists, and nutritionists may be part of the treatment team.    What can you do to help your HS?  1. If your are a smoker, then stopping can probably be helpful.  Your dermatologist will be happy to refer you to some one who can help with this.  2. Follow a healthy diet and try to achieve a healthy weight  Some other self-help measures are:  ??? Keep your skin cool and dry (becoming overheated and sweating can contribute to an HS flare)  ??? To reduce the pain of cysts or nodules or to help them to drain, apply hot compresses or soak in hot water for 10 minutes at a time (use a clean washcloth or a teabag soaked in hot water)  ??? For female patients, cotton underwear that does not have tight elastic in the groin can be helpful.  Boyshort, brief, or boxer style underwear may be a better option as friction on hair follicles in affected areas can be a major trigger in some patients.  These can be easily found on [place] or with some retailers.  Fruit of the Loom and Underworks are two brands that are sometimes recommended.    Finally, know that you are not alone. Coping with the pain and other symptoms of HS can be very difficult, so it may be helpful to connect with others who live with HS. Patient groups and networks can be sources of important information and support. Some internet resources for information and connections are provided below.  Resources for Information    The Hidradenitis Suppurativa Foundation: A nonprofit organized by a group of physicians interested in treating and advancing research in hidradenitis suppurativa    American Academy of Dermatology  ARanked.fi    [employer]  ElevatorP[employer].de.html  [employer]: [employer]  https://www.rarediseases.org/rare-disease-information/rare-diseases/byID/358/viewAbstract  Trials of new medications for HS  https://www.clinicaltrials.gov

## 2018-03-06 NOTE — Unmapped (Signed)
ASSESSMENT/PLAN:  Hidradenitis Suppurativa   1. We discussed the typical natural history, pathogenesis, treatment options, and expected course as well as the relapsing and sometimes recalcitrant nature of the disease.    2.  Start prednisone 60 mg p.o. daily for 1 week, 40 mg daily p.o. for 1 week, 20 mg p.o. daily for 1 week 10 mg p.o. daily for 1 week.  Discussed risk of elevated blood sugar and she will check this at her primary care office in the next couple weeks.  3. Finasteride 5mg  once daily  4. Start Humira 160mg  day 1, then 80mg  day 15, then 40mg  [place] qweek starting day 29 for treatment of hidradenitis.  Discussed risks of tuberculosis, other uncommon infections, theoretical risk of malignancies, and other uncommon side effects.   5. Quant gold, hepatitis labs, baselines performed October 2019  RTC: 2 months    SUBJECTIVE:    CC: Hidradenitis Suppurativa    Dawn Ritter is a 78 y.o. female  who is seen for consultation today for follow-up of of hidradenitis suppurativa.  She was unable to start Humira after the last visit because she ended up in the hospital and was not able to follow-up with manufacturer assistance programs.  She got very high co-pays along with her Medicare that it left her unable to get it.  She did notice some temporary improvement while on prednisone for another indication but then quickly flared again.  Self-reported severity (0-5): 4  VAS pain today: 8  VAS average pain for the last month: 10  Requiring pain medication? Yes.  If so, what type/frequency? Tylenol  How often in pain?  continuously  Level of odor (0-5): 3  Level of itching (0-5): 3  Dressing changes needed for drainage:Several times a day  How much drainage: a lot of drainage  Flare in the last month (Y/N)? Yes.  How long ago was the last flare? in last 6 months  Developing new lesions? every day  Number of inflammatory lesions montly: 10 or more  DLQI: 17  Current treatment: Levofloxacin, metronidazole, clindamycin gel   How helpful is the current treatment in managing the following aspects of your disease?  Not at all helpful Somewhat helpful Very helpful   Pain ?? X ??   Decreasing length of flares X ?? ??   Decreasing new lesions X ?? ??   Drainage X ?? ??   Decreasing frequency of flares X ?? ??   Decreasing severity of flares X ?? ??   Odor  ?? X ?? ??   ??  Disease course:  Year when symptoms first noticed: 78 yrs old  Year of diagnosis: 2012  Who diagnosed you? Surgeon  Location of first symptoms: groin, inner thighs and buttocks  Typical involved areas include: groin, inner thighs and buttocks  Typical number of inflammatory lesions each month at baseline (from first visit): 10 or more  Disease triggers: stress    Are menstrual cycles irregular when not on birth control? No.  Current form of contraception: menopause  Effect of hormonal contraception on disease: never tried  Flaring with menstrual cycle (before, during, or after?): no relationship  Difficulty becoming pregnancy? No.  Pregnancy complications? other none  How many children? Not asnwered  Was not active during any pregnancy    FH:      Patient Mother Father Son Daughter Brother Sister Maternal Grandmother Maternal Grandfather Paternal Grandmother Paternal Grandfather Maternal Aunt Maternal Uncle Paternal Aunt Paternal Uncle Other:   Derm Hidradenitis  Suppurativa   *                                   Pilonidal sinus                                     Acne                                      Dissecting cellulitis(Scalp)                                     Eczema                                     Allergies                    Rheum Joint pains                                      SAPHO                                     Pyoderma gangrenosum                                     Back pain                                    Auto-immune disease                                     Asthma                    Endo Polycystic ovarian syndrome Thyroid disease                    Vitamin D deficiency                   Psych Anxiety  *                                   Depression  *                                   Dementia                                     Suicidal thoughts*  Cardio Hypertension *                                    High cholesterol                                     Heart attack                                    Stroke   *                                  Hem-onc Cancer  ______________                                    Anemia  *                  GI IBD (UC/Crohn's)                                   ID HIV                     Syphilis                     Other                         Social History:  Current or former smoker? current  Amount smoking: more than 1 ppd  How many years: started at age 39  ED visits in the last 5 years? never  Difficulty affording medications? always  Marital Status: divorced  Living with some one? Yes.    Prior treatments:  Topical: clindamycin  Systemic: clindamycin, rifampin (GI upset), Bactrim (allergic), doxycycline (GI upset)  Past surgical procedures: yes. Inner thigh and groin excision with closure, recurred  Past laser procedures: no      ROS: the balance of 10 systems is negative unless otherwise documented      OBJECTIVE:   Gen: Well-appearing patient, appropriate, interactive, in no acute distress  Skin: Examination of the scalp, face, neck, chest, back, abdomen, bilateral upper and lower extremities, hands, palms, soles, nails, buttocks, and external genitalia performed today and pertinent for:     location Abscess Inflamed nodule Non-inflamed nodule Draining sinus Non-draining Sinus Hurley % scar   R axilla          L axilla          R inframammary          L inframammary          Intermammary          Pubic          R inguinal  6  5      R thigh          L inguinal  4  3      L thigh          Scrotum/Vulva          Perianal  R buttock  2  1 1      L buttock  1  1      Other (list)                      AN count (total sum of abscess and inflammatory nodule): 13  Pilonidal sinus (Y/N, or previously treated)? No.  Approximate BSA involved by inflammatory lesions: 3%  Intertriginous comedones: few  Diffuse comedones (trunk, face, etc): none  Acne scars: facial  Cribriform scarring: No.  Intertrigionus epidermal inclusion cysts: 1-3  Diffuse (trunk, feace, extremities) epidermal inclusion cysts: none  Regular phenoytpe

## 2018-03-07 MED ORDER — LEVOFLOXACIN 750 MG TABLET
0 refills | 0 days | Status: CP
Start: 2018-03-07 — End: 2018-04-17

## 2018-03-13 ENCOUNTER — Other Ambulatory Visit: Payer: Self-pay | Admitting: Gastroenterology

## 2018-03-14 MED ORDER — METRONIDAZOLE 500 MG TABLET
0 refills | 0 days | Status: CP
Start: 2018-03-14 — End: 2018-04-26

## 2018-03-31 ENCOUNTER — Encounter (HOSPITAL_COMMUNITY)
Admission: RE | Disposition: A | Discharge status: Home / Self Care | Payer: Self-pay | Source: Home / Self Care | Attending: Gastroenterology

## 2018-03-31 ENCOUNTER — Other Ambulatory Visit: Payer: Self-pay

## 2018-03-31 ENCOUNTER — Ambulatory Visit (HOSPITAL_COMMUNITY): Payer: Medicare PPO | Admitting: Certified Registered Nurse Anesthetist

## 2018-03-31 ENCOUNTER — Ambulatory Visit (HOSPITAL_COMMUNITY)
Admission: RE | Admit: 2018-03-31 | Discharge: 2018-03-31 | Disposition: A | Discharge status: Home / Self Care | Payer: Medicare PPO | Attending: Gastroenterology | Admitting: Gastroenterology

## 2018-03-31 ENCOUNTER — Encounter (HOSPITAL_COMMUNITY): Payer: Self-pay | Admitting: Certified Registered Nurse Anesthetist

## 2018-03-31 DIAGNOSIS — E860 Dehydration: Secondary | ICD-10-CM | POA: Diagnosis not present

## 2018-03-31 DIAGNOSIS — D123 Benign neoplasm of transverse colon: Secondary | ICD-10-CM | POA: Insufficient documentation

## 2018-03-31 DIAGNOSIS — Z800 Family history of malignant neoplasm of digestive organs: Secondary | ICD-10-CM | POA: Insufficient documentation

## 2018-03-31 DIAGNOSIS — K921 Melena: Secondary | ICD-10-CM | POA: Diagnosis not present

## 2018-03-31 DIAGNOSIS — Z8042 Family history of malignant neoplasm of prostate: Secondary | ICD-10-CM | POA: Diagnosis not present

## 2018-03-31 DIAGNOSIS — D122 Benign neoplasm of ascending colon: Secondary | ICD-10-CM | POA: Diagnosis not present

## 2018-03-31 DIAGNOSIS — F17210 Nicotine dependence, cigarettes, uncomplicated: Secondary | ICD-10-CM | POA: Insufficient documentation

## 2018-03-31 DIAGNOSIS — F419 Anxiety disorder, unspecified: Secondary | ICD-10-CM | POA: Diagnosis not present

## 2018-03-31 DIAGNOSIS — K219 Gastro-esophageal reflux disease without esophagitis: Secondary | ICD-10-CM | POA: Insufficient documentation

## 2018-03-31 DIAGNOSIS — B3781 Candidal esophagitis: Secondary | ICD-10-CM | POA: Diagnosis not present

## 2018-03-31 DIAGNOSIS — Z8673 Personal history of transient ischemic attack (TIA), and cerebral infarction without residual deficits: Secondary | ICD-10-CM | POA: Diagnosis not present

## 2018-03-31 DIAGNOSIS — Z881 Allergy status to other antibiotic agents status: Secondary | ICD-10-CM | POA: Insufficient documentation

## 2018-03-31 DIAGNOSIS — R7303 Prediabetes: Secondary | ICD-10-CM | POA: Diagnosis not present

## 2018-03-31 DIAGNOSIS — D509 Iron deficiency anemia, unspecified: Secondary | ICD-10-CM | POA: Diagnosis present

## 2018-03-31 DIAGNOSIS — H3530 Unspecified macular degeneration: Secondary | ICD-10-CM | POA: Insufficient documentation

## 2018-03-31 DIAGNOSIS — D649 Anemia, unspecified: Secondary | ICD-10-CM | POA: Insufficient documentation

## 2018-03-31 DIAGNOSIS — I10 Essential (primary) hypertension: Secondary | ICD-10-CM | POA: Insufficient documentation

## 2018-03-31 DIAGNOSIS — D124 Benign neoplasm of descending colon: Secondary | ICD-10-CM | POA: Insufficient documentation

## 2018-03-31 DIAGNOSIS — D125 Benign neoplasm of sigmoid colon: Secondary | ICD-10-CM | POA: Insufficient documentation

## 2018-03-31 DIAGNOSIS — Z90710 Acquired absence of both cervix and uterus: Secondary | ICD-10-CM | POA: Insufficient documentation

## 2018-03-31 HISTORY — PX: ESOPHAGOGASTRODUODENOSCOPY (EGD) WITH PROPOFOL: SHX5813

## 2018-03-31 HISTORY — PX: POLYPECTOMY: SHX5525

## 2018-03-31 HISTORY — PX: COLONOSCOPY WITH PROPOFOL: SHX5780

## 2018-03-31 SURGERY — ESOPHAGOGASTRODUODENOSCOPY (EGD) WITH PROPOFOL
Anesthesia: Monitor Anesthesia Care

## 2018-03-31 MED ORDER — PROPOFOL 500 MG/50ML IV EMUL
INTRAVENOUS | Status: DC | PRN
  Administered 2018-03-31: 75 ug/kg/min via INTRAVENOUS

## 2018-03-31 MED ORDER — PROPOFOL 10 MG/ML IV BOLUS
INTRAVENOUS | Status: AC
  Filled 2018-03-31: qty 60

## 2018-03-31 MED ORDER — PROPOFOL 10 MG/ML IV BOLUS
INTRAVENOUS | Status: DC | PRN
  Administered 2018-03-31: 50 mg via INTRAVENOUS

## 2018-03-31 MED ORDER — LIDOCAINE HCL (CARDIAC) PF 100 MG/5ML IV SOSY
PREFILLED_SYRINGE | INTRAVENOUS | Status: DC | PRN
  Administered 2018-03-31: 100 mg via INTRAVENOUS

## 2018-03-31 MED ORDER — LACTATED RINGERS IV SOLN
INTRAVENOUS | Status: DC | PRN
  Administered 2018-03-31: 08:00:00 via INTRAVENOUS

## 2018-03-31 MED ORDER — ONDANSETRON HCL 4 MG/2ML IJ SOLN
INTRAMUSCULAR | Status: DC | PRN
  Administered 2018-03-31: 4 mg via INTRAVENOUS

## 2018-03-31 MED ORDER — PHENYLEPHRINE HCL 10 MG/ML IJ SOLN
INTRAMUSCULAR | Status: DC | PRN
  Administered 2018-03-31 (×2): 40 ug via INTRAVENOUS

## 2018-03-31 SURGICAL SUPPLY — 25 items

## 2018-03-31 NOTE — Transfer of Care (Signed)
Immediate Anesthesia Transfer of Care Note  Patient: Melinda Eaton  Procedure(s) Performed: ESOPHAGOGASTRODUODENOSCOPY (EGD) WITH PROPOFOL (N/A ) COLONOSCOPY WITH PROPOFOL (N/A ) POLYPECTOMY  Patient Location: PACU and Endoscopy Unit  Anesthesia Type:MAC  Level of Consciousness: awake, drowsy, lethargic and responds to stimulation  Airway & Oxygen Therapy: Patient Spontanous Breathing and Patient connected to nasal cannula oxygen  Post-op Assessment: Report given to RN, Post -op Vital signs reviewed and stable and Patient moving all extremities  Post vital signs: Reviewed and stable  Last Vitals:  Vitals Value Taken Time  BP    Temp    Pulse    Resp 15 03/31/2018  9:09 AM  SpO2    Vitals shown include unvalidated device data.  Last Pain:  Vitals:   03/31/18 0808  TempSrc: Oral         Complications: No apparent anesthesia complications

## 2018-03-31 NOTE — Op Note (Signed)
[facility] Patient Name: Melinda Eaton Procedure Date: 03/31/2018 MRN: [id number] Attending MD: Carol Ada , MD Date of Birth: 25-Jul-1940 CSN: [id number] Age: 78 Admit Type: Outpatient Procedure:                Colonoscopy Indications:              Heme positive stool, Iron deficiency anemia Providers:                Carol Ada, MD, Dorise Hiss, RN, Elspeth Cho                            Tech., Technician Referring MD:              Medicines:                Propofol per Anesthesia Complications:            No immediate complications. Estimated Blood Loss:     Estimated blood loss was minimal. Procedure:                Pre-Anesthesia Assessment:                           - Prior to the procedure, a History and Physical                            was performed, and patient medications and                            allergies were reviewed. [name] tolerance of                            previous anesthesia was also reviewed. The risks                            and benefits of the procedure and the sedation                            options and risks were discussed with the patient.                            All questions were answered, and informed consent                            was obtained. Prior Anticoagulants: The patient has                            taken Plavix (clopidogrel), last dose was 1 day                            prior to procedure. ASA Grade Assessment: III - A                            patient with severe systemic disease. After  reviewing the risks and benefits, the patient was                            deemed in satisfactory condition to undergo the                            procedure.                           - Sedation was administered by an anesthesia                            professional. Deep sedation was attained.                           After obtaining informed consent, the colonoscope                             was passed under direct vision. Throughout the                            procedure, the patient's blood pressure, pulse, and                            oxygen saturations were monitored continuously. The                            [id number]-[id number] ([id number] Olympus pediatric colonscope                            was introduced through the anus and advanced to the                            the cecum, identified by appendiceal orifice and                            ileocecal valve. The colonoscopy was performed                            without difficulty. The patient tolerated the                            procedure well. The quality of the bowel                            preparation was excellent. The ileocecal valve,                            appendiceal orifice, and rectum were photographed. Findings:      13 sessile polyps were found in the sigmoid colon, descending colon,       transverse colon and ascending colon. The polyps were 3 to 8 mm in size.       These polyps were removed with a cold snare. Resection and retrieval       were complete. Impression:               -  13 3 to 8 mm polyps in the sigmoid colon, in the                            descending colon, in the transverse colon and in                            the ascending colon, removed with a cold snare.                            Resected and retrieved. Moderate Sedation:      Not Applicable - Patient had care per Anesthesia. Recommendation:           - Patient has a contact number available for                            emergencies. The signs and symptoms of potential                            delayed complications were discussed with the                            patient. Return to normal activities tomorrow.                            Written discharge instructions were provided to the                            patient.                           - Resume previous diet.                            - Continue present medications.                           - Await pathology results.                           - Repeat colonoscopy in 1 year for surveillance. Procedure Code(s):        --- Professional ---                           [phone], Colonoscopy, flexible; with removal of                            tumor(s), polyp(s), or other lesion(s) by snare                            technique Diagnosis Code(s):        --- Professional ---                           D12.5, Benign neoplasm of sigmoid colon  D12.4, Benign neoplasm of descending colon                           D12.3, Benign neoplasm of transverse colon (hepatic                            flexure or splenic flexure)                           D12.2, Benign neoplasm of ascending colon                           R19.5, Other fecal abnormalities                           D50.9, Iron deficiency anemia, unspecified CPT copyright 2018 American Medical Association. All rights reserved. The codes documented in this report are preliminary and upon coder review may  be revised to meet current compliance requirements. Carol Ada, MD Carol Ada, MD 03/31/2018 9:19:22 AM This report has been signed electronically. Number of Addenda: 0
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
## 2018-03-31 NOTE — Anesthesia Preprocedure Evaluation (Signed)
Anesthesia Evaluation  Patient identified by MRN, date of birth, ID band Patient awake    Reviewed: Allergy & Precautions, NPO status , Patient's Chart, lab work & pertinent test results  Airway Mallampati: I       Dental no notable dental hx. (+) Teeth Intact   Pulmonary Current Smoker,    Pulmonary exam normal breath sounds clear to auscultation       Cardiovascular hypertension, Pt. on medications  Rhythm:Regular Rate:Normal     Neuro/Psych Anxiety CVA    GI/Hepatic   Endo/Other    Renal/GU   negative genitourinary   Musculoskeletal negative musculoskeletal ROS (+)   Abdominal Normal abdominal exam  (+)   Peds  Hematology   Anesthesia Other Findings Melinda Eaton  GATED SPECT [facility] PERF [facility] STRESS 1D  Order# [id number]  Reading physician: Jerline Pain, MD Ordering physician: Imogene Burn, PA-C Study date: 12/30/15 Patient Information   Name MRN Description Melinda Eaton [id number] 78 y.o. female Result Notes for Myocardial Perfusion Imaging   Notes Recorded by Jeanann Lewandowsky, RMA on 12/30/2015 at 4:48 PM EST Pt has been made aware of her stress test results and that she has been cleared for sx. Fwd note to Dr. Scot Dock to let him know pt has been cleared. ------  Notes Recorded by Imogene Burn, PA-C on 12/30/2015 at 4:32 PM EST Low risk nuclear stress test. Can proceed with carotid surgery. Please send a note to surgeons   Vitals   Height Weight BMI (Calculated) _0  (1.6 m) 71.2 kg 27.9 Study Highlights     Nuclear stress EF: 57%. No obvious wall motion abnormalities.  Defect 1: There is a small defect of mild severity present in the apex location.  There was no ST segment deviation noted during stress.  This is a low risk study. No ischemia identified.   Candee Furbish, MD      Reproductive/Obstetrics                             Anesthesia  Physical Anesthesia Plan  ASA: II  Anesthesia Plan: MAC   Post-op Pain Management:    Induction: Intravenous  PONV Risk Score and Plan: Propofol infusion  Airway Management Planned: Natural Airway and Mask  Additional Equipment:   Intra-op Plan:   Post-operative Plan:   Informed Consent: I have reviewed the patients History and Physical, chart, labs and discussed the procedure including the risks, benefits and alternatives for the proposed anesthesia with the patient or authorized representative who has indicated his/her understanding and acceptance.     Dental advisory given  Plan Discussed with: CRNA  Anesthesia Plan Comments:         Anesthesia Quick Evaluation

## 2018-03-31 NOTE — Anesthesia Postprocedure Evaluation (Signed)
Anesthesia Post Note  Patient: Melinda Eaton  Procedure(s) Performed: ESOPHAGOGASTRODUODENOSCOPY (EGD) WITH PROPOFOL (N/A ) COLONOSCOPY WITH PROPOFOL (N/A ) POLYPECTOMY     Patient location during evaluation: Endoscopy Anesthesia Type: MAC Level of consciousness: awake Pain management: pain level controlled Vital Signs Assessment: post-procedure vital signs reviewed and stable Respiratory status: spontaneous breathing Cardiovascular status: stable Postop Assessment: no apparent nausea or vomiting Anesthetic complications: no    Last Vitals:  Vitals:   03/31/18 0930 03/31/18 0940  BP: (!) 174/68 (!) 183/77  Pulse: 79 77  Resp: 15 16  Temp:    SpO2: 99% 98%    Last Pain:  Vitals:   03/31/18 0920  TempSrc:   PainSc: 0-No pain   Pain Goal:                   Huston Foley

## 2018-03-31 NOTE — Discharge Instructions (Signed)

## 2018-03-31 NOTE — Op Note (Signed)
[facility] Patient Name: Melinda Eaton Procedure Date: 03/31/2018 MRN: [id number] Attending MD: Carol Ada , MD Date of Birth: May 04, 1940 CSN: [id number] Age: 78 Admit Type: Outpatient Procedure:                Upper GI endoscopy Indications:              Iron deficiency anemia Providers:                Carol Ada, MD, Dorise Hiss, RN, Carmie End, RN, Elspeth Cho Tech., Technician Referring MD:              Medicines:                Propofol per Anesthesia Complications:            No immediate complications. Estimated Blood Loss:     Estimated blood loss: none. Procedure:                Pre-Anesthesia Assessment:                           - Prior to the procedure, a History and Physical                            was performed, and patient medications and                            allergies were reviewed. [name] tolerance of                            previous anesthesia was also reviewed. The risks                            and benefits of the procedure and the sedation                            options and risks were discussed with the patient.                            All questions were answered, and informed consent                            was obtained. Prior Anticoagulants: The patient has                            taken Plavix (clopidogrel), last dose was 1 day                            prior to procedure. ASA Grade Assessment: III - A                            patient with severe systemic disease. After  reviewing the risks and benefits, the patient was                            deemed in satisfactory condition to undergo the                            procedure.                           - Sedation was administered by an anesthesia                            professional. Deep sedation was attained.                           After obtaining informed consent, the endoscope  was                            passed under direct vision. Throughout the                            procedure, the patient's blood pressure, pulse, and                            oxygen saturations were monitored continuously. The                            PCF-H190DL ([id number]) Olympus pediatric colonscope                            was introduced through the mouth, and advanced to                            the second part of duodenum. The upper GI endoscopy                            was accomplished without difficulty. The patient                            tolerated the procedure well. Findings:      Patchy, white plaques were found in the entire esophagus.      The stomach was normal.      The examined duodenum was normal. Impression:               - Esophageal plaques were found, consistent with                            candidiasis.                           - Normal stomach.                           - Normal examined duodenum.                           -  No specimens collected. Moderate Sedation:      Not Applicable - Patient had care per Anesthesia. Recommendation:           - Patient has a contact number available for                            emergencies. The signs and symptoms of potential                            delayed complications were discussed with the                            patient. Return to normal activities tomorrow.                            Written discharge instructions were provided to the                            patient.                           - Resume previous diet.                           - Continue present medications.                           - Diflucan (fluconazole) 100 mg PO daily for 10                            days.                           - Schedule for a VCE. Procedure Code(s):        --- Professional ---                           [phone], Esophagogastroduodenoscopy, flexible,                            transoral;  diagnostic, including collection of                            specimen(s) by brushing or washing, when performed                            (separate procedure) Diagnosis Code(s):        --- Professional ---                           K22.9, Disease of esophagus, unspecified                           D50.9, Iron deficiency anemia, unspecified CPT copyright 2018 American Medical Association. All rights reserved. The codes documented in this report are preliminary and upon coder review may  be revised to meet current compliance requirements. Carol Ada, MD Carol Ada, MD  03/31/2018 9:22:31 AM This report has been signed electronically. Number of Addenda: 0

## 2018-04-03 ENCOUNTER — Encounter (HOSPITAL_COMMUNITY): Payer: Self-pay | Admitting: Gastroenterology

## 2018-04-17 MED ORDER — LEVOFLOXACIN 750 MG TABLET
0 refills | 0 days | Status: CP
Start: 2018-04-17 — End: 2018-05-15

## 2018-04-25 DIAGNOSIS — L732 Hidradenitis suppurativa: Principal | ICD-10-CM

## 2018-04-26 MED ORDER — METRONIDAZOLE 500 MG TABLET
0 refills | 0 days | Status: CP
Start: 2018-04-26 — End: 2018-07-10

## 2018-05-01 ENCOUNTER — Ambulatory Visit: Admit: 2018-05-01 | Discharge: 2018-05-02 | Payer: MEDICARE

## 2018-05-01 DIAGNOSIS — L732 Hidradenitis suppurativa: Principal | ICD-10-CM

## 2018-05-01 MED ORDER — PREDNISONE 10 MG TABLET
ORAL_TABLET | 0 refills | 0 days | Status: CP
Start: 2018-05-01 — End: ?

## 2018-05-01 NOTE — Unmapped (Signed)
ASSESSMENT/PLAN:  Hidradenitis Suppurativa   1. We discussed the typical natural history, pathogenesis, treatment options, and expected course as well as the relapsing and sometimes recalcitrant nature of the disease.    2.  Restart prednisone 60 mg p.o. daily for 1 week, 40 mg daily p.o. for 1 week, 20 mg p.o. daily for 1 week 10 mg p.o. daily for 1 week.  Discussed risk of elevated blood sugar and she will check this at her primary care office in the next couple weeks.  3. Finasteride 5mg  once daily  4. Approval for MAP just went through and gave them contact info: Start Humira 160mg  day 1, then 80mg  day 15, then 40mg  [place] qweek starting day 29 for treatment of hidradenitis.  Discussed risks of tuberculosis, other uncommon infections, theoretical risk of malignancies, and other uncommon side effects.   5. Quant gold, hepatitis labs, baselines performed October 2019  RTC: 2 months    SUBJECTIVE:    CC: Hidradenitis Suppurativa    Saidah Ritter is a 78 y.o. female  who is seen for consultation today for follow-up of of hidradenitis suppurativa.  Her MAP approval just went through about a week ago for Humira, but she did not answer or receive calls from them to set up delivery so far.  Got a lot of improvement while on a prednisone taper after the last visit, but now is recurring and finding it difficult to walk again.      ??  Disease course:  Year when symptoms first noticed: 78 yrs old  Year of diagnosis: 2012  Who diagnosed you? Surgeon  Location of first symptoms: groin, inner thighs and buttocks  Typical involved areas include: groin, inner thighs and buttocks  Typical number of inflammatory lesions each month at baseline (from first visit): 10 or more  Disease triggers: stress    Are menstrual cycles irregular when not on birth control? No.  Current form of contraception: menopause  Effect of hormonal contraception on disease: never tried  Flaring with menstrual cycle (before, during, or after?): no relationship  Difficulty becoming pregnancy? No.  Pregnancy complications? other none  How many children? Not asnwered  Was not active during any pregnancy    FH:      Patient Mother Father Son Daughter Brother Sister Maternal Grandmother Maternal Grandfather Paternal Grandmother Paternal Grandfather Maternal Aunt Maternal Uncle Paternal Aunt Paternal Uncle Other:   Derm Hidradenitis Suppurativa   *                                   Pilonidal sinus                                     Acne                                      Dissecting cellulitis([place])                                     Eczema  Allergies                    Rheum Joint pains                                      SAPHO                                     Pyoderma gangrenosum                                     Back pain                                    Auto-immune disease                                     Asthma                    Endo Polycystic ovarian syndrome                                     Thyroid disease                    Vitamin D deficiency                   Psych Anxiety  *                                   Depression  *                                   Dementia                                     Suicidal thoughts*                                   Cardio Hypertension *                                    High cholesterol                                     Heart attack                                    Stroke   *  Hem-onc Cancer  ______________                                    Anemia  *                  GI IBD (UC/Crohn's)                                   ID HIV                     Syphilis                     Other                         Social History:  Current or former smoker? current  Amount smoking: more than 1 ppd  How many years: started at age 43  ED visits in the last 5 years? never  Difficulty affording medications? always  Marital Status: divorced Living with some one? Yes.    Prior treatments:  Topical: clindamycin  Systemic: clindamycin, rifampin (GI upset), Bactrim (allergic), doxycycline (GI upset)  Past surgical procedures: yes. Inner thigh and groin excision with closure, recurred  Past laser procedures: no      ROS: the balance of 10 systems is negative unless otherwise documented      OBJECTIVE:   Gen: Well-appearing patient, appropriate, interactive, in no acute distress  Skin: Examination of the scalp, face, neck, chest, back, abdomen, bilateral upper and lower extremities, hands, palms, soles, nails, buttocks, and external genitalia performed today and pertinent for:     location Abscess Inflamed nodule Non-inflamed nodule Draining sinus Non-draining Sinus [place] % scar   R axilla          L axilla          R inframammary          L inframammary          Intermammary          Pubic          R inguinal  5  4 1      R thigh          L inguinal  4  3      L thigh          Scrotum/Vulva          Perianal          R buttock  2  1 1      L buttock  1  1      Other (list)                      AN count (total sum of abscess and inflammatory nodule): 12  Pilonidal sinus (Y/N, or previously treated)? No.  Approximate BSA involved by inflammatory lesions: 3%  Intertriginous comedones: few  Diffuse comedones (trunk, face, etc): none  Acne scars: facial  Cribriform scarring: No.  Intertrigionus epidermal inclusion cysts: 1-3  Diffuse (trunk, feace, extremities) epidermal inclusion cysts: none  Regular phenoytpe  -sites not commented on demonstrate normal findings.

## 2018-05-01 NOTE — Unmapped (Addendum)
Patient Education        Hidradenitis Suppurativa: Care Instructions  Your Care Instructions    Hidradenitis suppurativa (say hih-drad-uh-NY-tus sup-yur-uh-TY-vuh) is a skin condition that causes lumps on the skin that look like pimples or boils. The lumps are usually painful and can break open and drain blood and bad-smelling pus. The condition can come and go for many years.  Treatment for this condition may include antibiotics and other medicines. You may need surgery to remove the lumps. Home care includes wearing loose-fitting clothes and washing the area gently. You can help prevent lumps from coming back by staying at a healthy weight and not smoking.  Doctors don't know exactly how this condition starts. But they do know that something irritates and inflames the hair follicles, causing them to swell and form lumps. This skin condition can't be spread from person to person (isn't contagious).  Follow-up care is a key part of your treatment and safety. Be sure to make and go to all appointments, and call your doctor if you are having problems. It's also a good idea to know your test results and keep a list of the medicines you take.  How can you care for yourself at home?  ??Skin care  ?? ?? Wash the area every day with mild soap. Use your hands rather than a washcloth or sponge when you wash that part of your body.   ?? ?? Leave the affected areas uncovered when you can. If you have lumps that are draining, you can cover them with a bandage or other dressing. Put petroleum jelly (such as Vaseline) on the dressing to help keep it from sticking.   ?? ?? Wear-loose fitting clothes that don't rub against the area. Avoid activities that cause skin to rub together.   ?? ?? If you have pain, try a warm compress. Soak a towel or washcloth in warm water, wring it out, and place it on the affected skin for about 10 minutes.   Medicines  ?? ?? Be safe with medicines. Take your medicines exactly as prescribed. Call your doctor if you think you are having a problem with your medicine. You will get more details on the specific medicines your doctor prescribes.   ?? ?? If your doctor prescribed antibiotics, take them as directed. Do not stop taking them just because you feel better. You need to take the full course of antibiotics.   ??Lifestyle choices  ?? ?? If you smoke, think about quitting. Smoking can make the condition worse. If you need help quitting, talk to your doctor about stop-smoking programs and medicines. These can increase your chances of quitting for good.   ?? ?? Stay at a healthy weight, or lose weight, by eating healthy foods and being physically active. Being overweight could make this condition worse.   When should you call for help?  Call your doctor now or seek immediate medical care if:  ?? ?? You have symptoms of infection, such as:  ? Increased pain, swelling, warmth, or redness.  ? Red streaks leading from the area.  ? Pus draining from the area.  ? A fever.   ??Watch closely for changes in your health, and be sure to contact your doctor if:  ?? ?? You do not get better as expected.   Where can you learn more?  Go to [facility] at https://myuncchart.org  [facility] under [employer]. Enter [phone] in the search box to learn more about Hidradenitis Suppurativa: Care  Instructions.  Current as of: October 30, 2019Content Version: 12.4  ?? 2006-2020 [employer].  Care instructions adapted under license by [facility]. If you have questions about a medical condition or this instruction, always ask your healthcare professional. [employer] disclaims any warranty or liability for your use of this information.         A representative from the manufacturer program will reach out to the patient to schedule a delivery.  Patient can follow up with manufacturer if needed via phone# @ [phone].

## 2018-05-03 DIAGNOSIS — L732 Hidradenitis suppurativa: Principal | ICD-10-CM

## 2018-05-03 MED ORDER — ADALIMUMAB PEN CITRATE FREE 40 MG/0.4 ML
SUBCUTANEOUS | 11 refills | 0.00000 days | Status: CP
Start: 2018-05-03 — End: ?

## 2018-05-14 DIAGNOSIS — L732 Hidradenitis suppurativa: Principal | ICD-10-CM

## 2018-05-15 MED ORDER — LEVOFLOXACIN 750 MG TABLET
0 refills | 0 days | Status: CP
Start: 2018-05-15 — End: 2018-06-14

## 2018-06-14 MED ORDER — LEVOFLOXACIN 750 MG TABLET
ORAL_TABLET | 0 refills | 0 days | Status: CP
Start: 2018-06-14 — End: 2018-07-24

## 2018-06-15 ENCOUNTER — Inpatient Hospital Stay (HOSPITAL_COMMUNITY)
Admission: EM | Admit: 2018-06-15 | Discharge: 2018-06-20 | DRG: 065 | Disposition: A | Discharge status: Home Health | Payer: Medicare PPO | Attending: Neurology | Admitting: Neurology

## 2018-06-15 DIAGNOSIS — K219 Gastro-esophageal reflux disease without esophagitis: Secondary | ICD-10-CM | POA: Diagnosis present

## 2018-06-15 DIAGNOSIS — I472 Ventricular tachycardia: Secondary | ICD-10-CM | POA: Diagnosis present

## 2018-06-15 DIAGNOSIS — I6521 Occlusion and stenosis of right carotid artery: Secondary | ICD-10-CM | POA: Diagnosis present

## 2018-06-15 DIAGNOSIS — D649 Anemia, unspecified: Secondary | ICD-10-CM | POA: Diagnosis present

## 2018-06-15 DIAGNOSIS — Z720 Tobacco use: Secondary | ICD-10-CM | POA: Diagnosis present

## 2018-06-15 DIAGNOSIS — I429 Cardiomyopathy, unspecified: Secondary | ICD-10-CM | POA: Diagnosis present

## 2018-06-15 DIAGNOSIS — I69392 Facial weakness following cerebral infarction: Secondary | ICD-10-CM

## 2018-06-15 DIAGNOSIS — Z7902 Long term (current) use of antithrombotics/antiplatelets: Secondary | ICD-10-CM

## 2018-06-15 DIAGNOSIS — Z7982 Long term (current) use of aspirin: Secondary | ICD-10-CM

## 2018-06-15 DIAGNOSIS — Z8042 Family history of malignant neoplasm of prostate: Secondary | ICD-10-CM

## 2018-06-15 DIAGNOSIS — I10 Essential (primary) hypertension: Secondary | ICD-10-CM | POA: Diagnosis present

## 2018-06-15 DIAGNOSIS — F17210 Nicotine dependence, cigarettes, uncomplicated: Secondary | ICD-10-CM | POA: Diagnosis present

## 2018-06-15 DIAGNOSIS — Z79899 Other long term (current) drug therapy: Secondary | ICD-10-CM

## 2018-06-15 DIAGNOSIS — D62 Acute posthemorrhagic anemia: Secondary | ICD-10-CM | POA: Diagnosis present

## 2018-06-15 DIAGNOSIS — E876 Hypokalemia: Secondary | ICD-10-CM

## 2018-06-15 DIAGNOSIS — Z881 Allergy status to other antibiotic agents status: Secondary | ICD-10-CM

## 2018-06-15 DIAGNOSIS — I5020 Unspecified systolic (congestive) heart failure: Secondary | ICD-10-CM | POA: Diagnosis present

## 2018-06-15 DIAGNOSIS — I110 Hypertensive heart disease with heart failure: Secondary | ICD-10-CM | POA: Diagnosis present

## 2018-06-15 DIAGNOSIS — L8990 Pressure ulcer of unspecified site, unspecified stage: Secondary | ICD-10-CM | POA: Diagnosis present

## 2018-06-15 DIAGNOSIS — E785 Hyperlipidemia, unspecified: Secondary | ICD-10-CM | POA: Diagnosis present

## 2018-06-15 DIAGNOSIS — Z90710 Acquired absence of both cervix and uterus: Secondary | ICD-10-CM

## 2018-06-15 DIAGNOSIS — L732 Hidradenitis suppurativa: Secondary | ICD-10-CM | POA: Diagnosis present

## 2018-06-15 DIAGNOSIS — I615 Nontraumatic intracerebral hemorrhage, intraventricular: Principal | ICD-10-CM

## 2018-06-15 DIAGNOSIS — E1151 Type 2 diabetes mellitus with diabetic peripheral angiopathy without gangrene: Secondary | ICD-10-CM | POA: Diagnosis present

## 2018-06-15 DIAGNOSIS — E1165 Type 2 diabetes mellitus with hyperglycemia: Secondary | ICD-10-CM | POA: Diagnosis present

## 2018-06-15 DIAGNOSIS — R351 Nocturia: Secondary | ICD-10-CM | POA: Diagnosis present

## 2018-06-15 DIAGNOSIS — R1110 Vomiting, unspecified: Secondary | ICD-10-CM | POA: Diagnosis not present

## 2018-06-15 DIAGNOSIS — E119 Type 2 diabetes mellitus without complications: Secondary | ICD-10-CM

## 2018-06-15 DIAGNOSIS — R29702 NIHSS score 2: Secondary | ICD-10-CM | POA: Diagnosis present

## 2018-06-15 DIAGNOSIS — E871 Hypo-osmolality and hyponatremia: Secondary | ICD-10-CM | POA: Diagnosis present

## 2018-06-15 HISTORY — DX: Hidradenitis suppurativa: L73.2

## 2018-06-15 HISTORY — DX: Type 2 diabetes mellitus without complications: E11.9

## 2018-06-15 LAB — CBG MONITORING, ED: Glucose-Capillary: 199 mg/dL — ABNORMAL HIGH (ref 70–99)

## 2018-06-15 NOTE — ED Triage Notes (Signed)
Pt states has had headache and weakness for the past two days. Tonight she began to feel nauseous and began to start vomiting. At scene EMS gave 4mg  of Zofran with no relief and noticed left facial droop when smiling. History of stroke on Plavix. Lives with son and daughter in law unknown last time seen normal at current time.

## 2018-06-16 ENCOUNTER — Inpatient Hospital Stay (HOSPITAL_COMMUNITY): Payer: Medicare PPO

## 2018-06-16 ENCOUNTER — Emergency Department (HOSPITAL_COMMUNITY): Payer: Medicare PPO

## 2018-06-16 ENCOUNTER — Other Ambulatory Visit: Payer: Self-pay

## 2018-06-16 ENCOUNTER — Encounter (HOSPITAL_COMMUNITY): Payer: Self-pay | Admitting: Radiology

## 2018-06-16 DIAGNOSIS — I619 Nontraumatic intracerebral hemorrhage, unspecified: Secondary | ICD-10-CM | POA: Diagnosis not present

## 2018-06-16 DIAGNOSIS — I10 Essential (primary) hypertension: Secondary | ICD-10-CM | POA: Diagnosis not present

## 2018-06-16 DIAGNOSIS — Z881 Allergy status to other antibiotic agents status: Secondary | ICD-10-CM | POA: Diagnosis not present

## 2018-06-16 DIAGNOSIS — E1159 Type 2 diabetes mellitus with other circulatory complications: Secondary | ICD-10-CM

## 2018-06-16 DIAGNOSIS — I472 Ventricular tachycardia: Secondary | ICD-10-CM | POA: Diagnosis present

## 2018-06-16 DIAGNOSIS — E785 Hyperlipidemia, unspecified: Secondary | ICD-10-CM | POA: Diagnosis present

## 2018-06-16 DIAGNOSIS — R1110 Vomiting, unspecified: Secondary | ICD-10-CM | POA: Diagnosis present

## 2018-06-16 DIAGNOSIS — F17210 Nicotine dependence, cigarettes, uncomplicated: Secondary | ICD-10-CM | POA: Diagnosis present

## 2018-06-16 DIAGNOSIS — F17200 Nicotine dependence, unspecified, uncomplicated: Secondary | ICD-10-CM | POA: Diagnosis not present

## 2018-06-16 DIAGNOSIS — L732 Hidradenitis suppurativa: Secondary | ICD-10-CM | POA: Diagnosis present

## 2018-06-16 DIAGNOSIS — R29702 NIHSS score 2: Secondary | ICD-10-CM | POA: Diagnosis present

## 2018-06-16 DIAGNOSIS — Z8673 Personal history of transient ischemic attack (TIA), and cerebral infarction without residual deficits: Secondary | ICD-10-CM

## 2018-06-16 DIAGNOSIS — E1165 Type 2 diabetes mellitus with hyperglycemia: Secondary | ICD-10-CM | POA: Diagnosis present

## 2018-06-16 DIAGNOSIS — I69392 Facial weakness following cerebral infarction: Secondary | ICD-10-CM | POA: Diagnosis not present

## 2018-06-16 DIAGNOSIS — Z7982 Long term (current) use of aspirin: Secondary | ICD-10-CM | POA: Diagnosis not present

## 2018-06-16 DIAGNOSIS — I429 Cardiomyopathy, unspecified: Secondary | ICD-10-CM | POA: Diagnosis not present

## 2018-06-16 DIAGNOSIS — R351 Nocturia: Secondary | ICD-10-CM | POA: Diagnosis present

## 2018-06-16 DIAGNOSIS — E871 Hypo-osmolality and hyponatremia: Secondary | ICD-10-CM | POA: Diagnosis present

## 2018-06-16 DIAGNOSIS — E1151 Type 2 diabetes mellitus with diabetic peripheral angiopathy without gangrene: Secondary | ICD-10-CM | POA: Diagnosis present

## 2018-06-16 DIAGNOSIS — I6521 Occlusion and stenosis of right carotid artery: Secondary | ICD-10-CM | POA: Diagnosis present

## 2018-06-16 DIAGNOSIS — I639 Cerebral infarction, unspecified: Secondary | ICD-10-CM | POA: Diagnosis not present

## 2018-06-16 DIAGNOSIS — I615 Nontraumatic intracerebral hemorrhage, intraventricular: Secondary | ICD-10-CM

## 2018-06-16 DIAGNOSIS — D62 Acute posthemorrhagic anemia: Secondary | ICD-10-CM | POA: Diagnosis present

## 2018-06-16 DIAGNOSIS — Z90710 Acquired absence of both cervix and uterus: Secondary | ICD-10-CM | POA: Diagnosis not present

## 2018-06-16 DIAGNOSIS — Z7902 Long term (current) use of antithrombotics/antiplatelets: Secondary | ICD-10-CM | POA: Diagnosis not present

## 2018-06-16 DIAGNOSIS — E876 Hypokalemia: Secondary | ICD-10-CM | POA: Diagnosis not present

## 2018-06-16 DIAGNOSIS — Z9889 Other specified postprocedural states: Secondary | ICD-10-CM

## 2018-06-16 DIAGNOSIS — K219 Gastro-esophageal reflux disease without esophagitis: Secondary | ICD-10-CM | POA: Diagnosis present

## 2018-06-16 DIAGNOSIS — I110 Hypertensive heart disease with heart failure: Secondary | ICD-10-CM | POA: Diagnosis present

## 2018-06-16 DIAGNOSIS — Z79899 Other long term (current) drug therapy: Secondary | ICD-10-CM | POA: Diagnosis not present

## 2018-06-16 DIAGNOSIS — Z8042 Family history of malignant neoplasm of prostate: Secondary | ICD-10-CM | POA: Diagnosis not present

## 2018-06-16 DIAGNOSIS — I5020 Unspecified systolic (congestive) heart failure: Secondary | ICD-10-CM | POA: Diagnosis present

## 2018-06-16 LAB — COMPREHENSIVE METABOLIC PANEL
ALT: 16 U/L (ref 0–44)
AST: 26 U/L (ref 15–41)
Albumin: 3.1 g/dL — ABNORMAL LOW (ref 3.5–5.0)
Alkaline Phosphatase: 67 U/L (ref 38–126)
Anion gap: 16 — ABNORMAL HIGH (ref 5–15)
BUN: 9 mg/dL (ref 8–23)
CO2: 23 mmol/L (ref 22–32)
Calcium: 9.3 mg/dL (ref 8.9–10.3)
Chloride: 95 mmol/L — ABNORMAL LOW (ref 98–111)
Creatinine, Ser: 0.82 mg/dL (ref 0.44–1.00)
GFR calc Af Amer: >60 mL/min (ref >60)
GFR calc non Af Amer: >60 mL/min (ref >60)
Glucose, Bld: 218 mg/dL — ABNORMAL HIGH (ref 70–99)
Potassium: 4.1 mmol/L (ref 3.5–5.1)
Sodium: 134 mmol/L — ABNORMAL LOW (ref 135–145)
Total Bilirubin: 0.7 mg/dL (ref 0.3–1.2)
Total Protein: 7.4 g/dL (ref 6.5–8.1)

## 2018-06-16 LAB — MRSA PCR SCREENING: MRSA by PCR: NEGATIVE

## 2018-06-16 LAB — ETHANOL: Alcohol, Ethyl (B): <10 mg/dL (ref <10)

## 2018-06-16 LAB — CBC
HCT: 40 % (ref 36.0–46.0)
Hemoglobin: 13.1 g/dL (ref 12.0–15.0)
MCH: 28.2 pg (ref 26.0–34.0)
MCHC: 32.8 g/dL (ref 30.0–36.0)
MCV: 86 fL (ref 80.0–100.0)
Platelets: 280 10*3/uL (ref 150–400)
RBC: 4.65 MIL/uL (ref 3.87–5.11)
RDW: 13.9 % (ref 11.5–15.5)
WBC: 11.5 10*3/uL — ABNORMAL HIGH (ref 4.0–10.5)
nRBC: 0 % (ref 0.0–0.2)

## 2018-06-16 LAB — DIFFERENTIAL
Abs Immature Granulocytes: 0.07 10*3/uL (ref 0.00–0.07)
Basophils Absolute: 0 10*3/uL (ref 0.0–0.1)
Basophils Relative: 0 %
Eosinophils Absolute: 0 10*3/uL (ref 0.0–0.5)
Eosinophils Relative: 0 %
Immature Granulocytes: 1 %
Lymphocytes Relative: 4 %
Lymphs Abs: 0.5 10*3/uL — ABNORMAL LOW (ref 0.7–4.0)
Monocytes Absolute: 0.5 10*3/uL (ref 0.1–1.0)
Monocytes Relative: 5 %
Neutro Abs: 10.4 10*3/uL — ABNORMAL HIGH (ref 1.7–7.7)
Neutrophils Relative %: 90 %

## 2018-06-16 LAB — URINALYSIS, COMPLETE (UACMP) WITH MICROSCOPIC
Bilirubin Urine: NEGATIVE
Glucose, UA: NEGATIVE mg/dL
Ketones, ur: NEGATIVE mg/dL
Nitrite: NEGATIVE
Protein, ur: NEGATIVE mg/dL
Specific Gravity, Urine: 1.003 — ABNORMAL LOW (ref 1.005–1.030)
pH: 7 (ref 5.0–8.0)

## 2018-06-16 LAB — GLUCOSE, CAPILLARY
Glucose-Capillary: 160 mg/dL — ABNORMAL HIGH (ref 70–99)
Glucose-Capillary: 92 mg/dL (ref 70–99)
Glucose-Capillary: 226 mg/dL — ABNORMAL HIGH (ref 70–99)
Glucose-Capillary: 95 mg/dL (ref 70–99)

## 2018-06-16 LAB — HEMOGLOBIN A1C
Hgb A1c MFr Bld: 8.2 % — ABNORMAL HIGH (ref 4.8–5.6)
Mean Plasma Glucose: 188.64 mg/dL

## 2018-06-16 LAB — LIPID PANEL
Cholesterol: 106 mg/dL (ref 0–200)
HDL: 35 mg/dL — ABNORMAL LOW (ref >40)
LDL Cholesterol: 57 mg/dL (ref 0–99)
Total CHOL/HDL Ratio: 3 RATIO
Triglycerides: 69 mg/dL (ref <150)
VLDL: 14 mg/dL (ref 0–40)

## 2018-06-16 LAB — APTT: aPTT: 33 seconds (ref 24–36)

## 2018-06-16 LAB — PROTIME-INR
INR: 1.1 (ref 0.8–1.2)
Prothrombin Time: 14.4 seconds (ref 11.4–15.2)

## 2018-06-16 MED ORDER — ACETAMINOPHEN 650 MG RE SUPP: 650.0000 mg | RECTAL | Status: DC | PRN

## 2018-06-16 MED ORDER — PANTOPRAZOLE SODIUM 40 MG PO TBEC
40.0000 mg | DELAYED_RELEASE_TABLET | Freq: Every day | ORAL | Status: DC
  Administered 2018-06-16 – 2018-06-20 (×5): 40 mg via ORAL
  Filled 2018-06-16 (×5): qty 1

## 2018-06-16 MED ORDER — ZINC OXIDE 11.3 % EX CREA
TOPICAL_CREAM | CUTANEOUS | Status: DC | PRN
  Administered 2018-06-16 – 2018-06-18 (×2): via TOPICAL
  Filled 2018-06-16 (×3): qty 56

## 2018-06-16 MED ORDER — LOSARTAN POTASSIUM 50 MG PO TABS
50.0000 mg | ORAL_TABLET | Freq: Two times a day (BID) | ORAL | Status: DC
  Administered 2018-06-16 – 2018-06-20 (×9): 50 mg via ORAL
  Filled 2018-06-16 (×10): qty 1

## 2018-06-16 MED ORDER — ACETAMINOPHEN 160 MG/5ML PO SOLN: 650.0000 mg | ORAL | Status: DC | PRN

## 2018-06-16 MED ORDER — AMLODIPINE BESYLATE 5 MG PO TABS
10.0000 mg | ORAL_TABLET | Freq: Every day | ORAL | Status: DC
  Administered 2018-06-16: 10 mg via ORAL
  Filled 2018-06-16: qty 2

## 2018-06-16 MED ORDER — METRONIDAZOLE 500 MG PO TABS
500.0000 mg | ORAL_TABLET | Freq: Three times a day (TID) | ORAL | Status: DC
  Administered 2018-06-16 – 2018-06-20 (×14): 500 mg via ORAL
  Filled 2018-06-16 (×15): qty 1

## 2018-06-16 MED ORDER — CLEVIDIPINE BUTYRATE 0.5 MG/ML IV EMUL: 0.0000 mg/h | INTRAVENOUS | Status: DC

## 2018-06-16 MED ORDER — INSULIN ASPART 100 UNIT/ML ~~LOC~~ SOLN
0.0000 [IU] | Freq: Three times a day (TID) | SUBCUTANEOUS | Status: DC
  Administered 2018-06-17: 2 [IU] via SUBCUTANEOUS
  Administered 2018-06-17 – 2018-06-20 (×3): 1 [IU] via SUBCUTANEOUS

## 2018-06-16 MED ORDER — STROKE: EARLY STAGES OF RECOVERY BOOK
Freq: Once | Status: AC
  Administered 2018-06-16: 05:00:00

## 2018-06-16 MED ORDER — INSULIN ASPART 100 UNIT/ML ~~LOC~~ SOLN
0.0000 [IU] | Freq: Three times a day (TID) | SUBCUTANEOUS | Status: DC
  Administered 2018-06-16: 3 [IU] via SUBCUTANEOUS
  Administered 2018-06-16: 5 [IU] via SUBCUTANEOUS

## 2018-06-16 MED ORDER — LABETALOL HCL 5 MG/ML IV SOLN: 5.0000 mg | INTRAVENOUS | Status: DC | PRN

## 2018-06-16 MED ORDER — ATORVASTATIN CALCIUM 40 MG PO TABS
40.0000 mg | ORAL_TABLET | Freq: Every day | ORAL | Status: DC
  Administered 2018-06-16: 40 mg via ORAL
  Filled 2018-06-16: qty 1

## 2018-06-16 MED ORDER — METFORMIN HCL 500 MG PO TABS
500.0000 mg | ORAL_TABLET | Freq: Two times a day (BID) | ORAL | Status: DC
  Administered 2018-06-17 – 2018-06-20 (×8): 500 mg via ORAL
  Filled 2018-06-16 (×8): qty 1

## 2018-06-16 MED ORDER — LABETALOL HCL 5 MG/ML IV SOLN
5.0000 mg | Freq: Once | INTRAVENOUS | Status: AC
  Administered 2018-06-16: 5 mg via INTRAVENOUS
  Filled 2018-06-16: qty 4

## 2018-06-16 MED ORDER — PANTOPRAZOLE SODIUM 40 MG IV SOLR: 40.0000 mg | Freq: Every day | INTRAVENOUS | Status: DC

## 2018-06-16 MED ORDER — LABETALOL HCL 5 MG/ML IV SOLN: 10.0000 mg | Freq: Once | INTRAVENOUS | Status: DC

## 2018-06-16 MED ORDER — LABETALOL HCL 5 MG/ML IV SOLN
20.0000 mg | Freq: Once | INTRAVENOUS | Status: AC
  Administered 2018-06-16: 20 mg via INTRAVENOUS
  Filled 2018-06-16: qty 4

## 2018-06-16 MED ORDER — FINASTERIDE 5 MG PO TABS
5.0000 mg | ORAL_TABLET | Freq: Every day | ORAL | Status: DC
  Administered 2018-06-16 – 2018-06-20 (×5): 5 mg via ORAL
  Filled 2018-06-16 (×5): qty 1

## 2018-06-16 MED ORDER — ACETAMINOPHEN 325 MG PO TABS: 650.0000 mg | ORAL_TABLET | ORAL | Status: DC | PRN

## 2018-06-16 MED ORDER — SODIUM CHLORIDE 0.9 % IV SOLN
INTRAVENOUS | Status: AC
  Administered 2018-06-16 (×2): via INTRAVENOUS

## 2018-06-16 MED ORDER — LEVOFLOXACIN 500 MG PO TABS
750.0000 mg | ORAL_TABLET | Freq: Every day | ORAL | Status: DC
  Administered 2018-06-16 – 2018-06-18 (×3): 750 mg via ORAL
  Filled 2018-06-16: qty 1
  Filled 2018-06-16 (×2): qty 2

## 2018-06-16 MED ORDER — INSULIN ASPART 100 UNIT/ML ~~LOC~~ SOLN: 0.0000 [IU] | Freq: Every day | SUBCUTANEOUS | Status: DC

## 2018-06-16 MED ORDER — SENNOSIDES-DOCUSATE SODIUM 8.6-50 MG PO TABS
1.0000 | ORAL_TABLET | Freq: Two times a day (BID) | ORAL | Status: DC
  Administered 2018-06-16 – 2018-06-20 (×8): 1 via ORAL
  Filled 2018-06-16 (×10): qty 1

## 2018-06-16 NOTE — Progress Notes (Signed)
OT Cancellation Note  Patient Details Name: Melinda Eaton MRN: [id number] DOB: 01-08-1941   Cancelled Treatment:    Reason Eval/Treat Not Completed: Active bedrest order OT order received and appreciated however this conflicts with current bedrest order set. Please increase activity tolerance as appropriate and remove bedrest from orders. . Please contact OT at [phone] if bed rest order is discontinued. OT will hold evaluation at this time and will check back as time allows pending increased activity orders.   Richelle Ito, OTR/L  Acute Rehabilitation Services Pager: [phone] Office: [phone] .  06/16/2018, 8:04 AM

## 2018-06-16 NOTE — Progress Notes (Addendum)
STROKE TEAM PROGRESS NOTE   SUBJECTIVE (INTERVAL HISTORY) Her RN is at the bedside.  Overall her condition is stable.  Complains of mild bifrontal headache.  Neuro intact. Repeat CT stable IVH without hydrocephalus.   OBJECTIVE Temp:  [97.6 F (36.4 C)-98.1 F (36.7 C)] 97.7 F (36.5 C) (04/24 1200) Pulse Rate:  [79-112] 99 (04/24 1600) Cardiac Rhythm: Normal sinus rhythm (04/24 0800) Resp:  [15-29] 28 (04/24 1600) BP: (95-169)/(54-95) 132/69 (04/24 1600) SpO2:  [92 %-100 %] 97 % (04/24 1600) Weight:  [65.8 kg] 65.8 kg (04/24 0024)  Recent Labs  Lab 06/15/18 2357 06/16/18 0815 06/16/18 1232 06/16/18 1657  GLUCAP 199* 160* 92 226*   Recent Labs  Lab 06/16/18 0003  NA 134*  K 4.1  CL 95*  CO2 23  GLUCOSE 218*  BUN 9  CREATININE 0.82  CALCIUM 9.3   Recent Labs  Lab 06/16/18 0003  AST 26  ALT 16  ALKPHOS 67  BILITOT 0.7  PROT 7.4  ALBUMIN 3.1*   Recent Labs  Lab 06/16/18 0003  WBC 11.5*  NEUTROABS 10.4*  HGB 13.1  HCT 40.0  MCV 86.0  PLT 280   No results for input(s): CKTOTAL, CKMB, CKMBINDEX, TROPONINI in the last 168 hours. Recent Labs    06/16/18 0003  LABPROT 14.4  INR 1.1   Recent Labs    06/16/18 1340  COLORURINE YELLOW  LABSPEC 1.003*  PHURINE 7.0  GLUCOSEU NEGATIVE  HGBUR MODERATE*  BILIRUBINUR NEGATIVE  KETONESUR NEGATIVE  PROTEINUR NEGATIVE  NITRITE NEGATIVE  LEUKOCYTESUR LARGE*       Component Value Date/Time   CHOL 106 06/16/2018 0003   TRIG 69 06/16/2018 0003   HDL 35 (L) 06/16/2018 0003   CHOLHDL 3.0 06/16/2018 0003   VLDL 14 06/16/2018 0003   LDLCALC 57 06/16/2018 0003   Lab Results  Component Value Date   HGBA1C 8.2 (H) 06/16/2018   No results found for: LABOPIA, [place], [place], [place], [place], [place]  Recent Labs  Lab 06/16/18 0003  ETH <10    I have personally reviewed the radiological images below and agree with the radiology interpretations.  Ct Head Wo Contrast  Result Date:  06/16/2018 CLINICAL DATA:  Follow-up intracranial hemorrhage. EXAM: CT HEAD WITHOUT CONTRAST TECHNIQUE: Contiguous axial images were obtained from the base of the skull through the vertex without intravenous contrast. COMPARISON:  Head CT and MRI 06/16/2018 FINDINGS: Brain: Intraventricular hemorrhage in the right greater than left lateral ventricles, third ventricle, and fourth ventricle demonstrates minimal interval redistribution but has not significantly changed in overall volume. The ventricles are unchanged in size with mild dilatation of the temporal horns compared to a 12/16/2017 CT. A moderate-sized chronic posterior right MCA infarct is again noted. No acute infarct, midline shift, or extra-axial fluid collection is identified. Patchy hypodensities in the cerebral white matter bilaterally are nonspecific but compatible with moderate chronic small vessel ischemic disease. Vascular: Calcified atherosclerosis at the skull base. No hyperdense vessel. Skull: No fracture or suspicious osseous lesion. Sinuses/Orbits: Paranasal sinuses and mastoid air cells are clear. Unremarkable orbits. Other: None. IMPRESSION: 1. Unchanged intraventricular hemorrhage. 2. Unchanged size of the ventricles. 3. Chronic right MCA infarct. Electronically Signed   By: Logan Bores M.D.   On: 06/16/2018 12:49   Ct Head Wo Contrast  Result Date: 06/16/2018 CLINICAL DATA:  Headache, weakness, nausea, vomiting EXAM: CT HEAD WITHOUT CONTRAST TECHNIQUE: Contiguous axial images were obtained from the base of the skull through the vertex without intravenous contrast. COMPARISON:  12/16/2017 FINDINGS: Brain: There is intraventricular blood noted most pronounced in the right lateral ventricle, but also noted in the left lateral ventricle, 3rd and 4th ventricles. No visible intraparenchymal hemorrhage. Old right posterior temporal and parietal infarcts, stable since prior study. There is atrophy and chronic small vessel disease changes.  Vascular: No hyperdense vessel or unexpected calcification. Skull: No acute calvarial abnormality. Sinuses/Orbits: Visualized paranasal sinuses and mastoids clear. Orbital soft tissues unremarkable. Other: None IMPRESSION: Isolated intraventricular hemorrhage, most pronounced in the right lateral ventricle. No visible intraparenchymal hemorrhage. Old right posterior MCA infarct, stable. Atrophy, chronic small vessel disease. Critical Value/emergent results were called by telephone at the time of interpretation on 06/16/2018 at 1:00 am to Dr. Merrily Pew , who verbally acknowledged these results. Electronically Signed   By: Rolm Baptise M.D.   On: 06/16/2018 01:02   Mr Jodene Nam Head Wo Contrast  Result Date: 06/16/2018 CLINICAL DATA:  Headache and weakness for 2 days EXAM: MRI HEAD WITHOUT CONTRAST MRA HEAD WITHOUT CONTRAST TECHNIQUE: Multiplanar, multiecho pulse sequences of the brain and surrounding structures were obtained without intravenous contrast. Angiographic images of the head were obtained using MRA technique without contrast. COMPARISON:  Head CT 06/16/2018 FINDINGS: MRI HEAD FINDINGS BRAIN: There is acute intraventricular hemorrhage, unchanged from the earlier head CT. Distribution of blood is unchanged. The midline structures are normal. There is an old right parietal lobe infarct. The white matter signal is normal for the patient's age. The cerebral and cerebellar volume are age-appropriate. No hydrocephalus. Susceptibility-sensitive sequences show no chronic microhemorrhage or superficial siderosis. No mass lesion. VASCULAR: The major intracranial arterial and venous sinus flow voids are normal. SKULL AND UPPER CERVICAL SPINE: Calvarial bone marrow signal is normal. There is no skull base mass. Visualized upper cervical spine and soft tissues are normal. SINUSES/ORBITS: No fluid levels or advanced mucosal thickening. No mastoid or middle ear effusion. The orbits are normal. MRA HEAD FINDINGS POSTERIOR  CIRCULATION: --Vertebral arteries: Normal codominant configuration of V4 segments. --Posterior inferior cerebellar arteries (PICA): Patent origins from the vertebral arteries. --Anterior inferior cerebellar arteries (AICA): Patent origins from the basilar artery. --Basilar artery: Normal. --Superior cerebellar arteries: Normal. --Posterior cerebral arteries (PCA): Normal. Both are predominantly supplied by the posterior communicating arteries (p-comm). ANTERIOR CIRCULATION: --Intracranial internal carotid arteries: Normal. --Anterior cerebral arteries (ACA): Normal. Both A1 segments are present. Patent anterior communicating artery (a-comm). --Middle cerebral arteries (MCA): Normal. IMPRESSION: 1. Unchanged distribution of intraventricular hemorrhage, predominantly within the right lateral ventricle. No causative lesion identified. 2. No intracranial aneurysm, vascular malformation or occlusion. 3. 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 Electronically Signed   By: Ulyses Jarred M.D.   On: 06/16/2018 03:25   Mr Brain Wo Contrast  Result Date: 06/16/2018 CLINICAL DATA:  Headache and weakness for 2 days EXAM: MRI HEAD WITHOUT CONTRAST MRA HEAD WITHOUT CONTRAST TECHNIQUE: Multiplanar, multiecho pulse sequences of the brain and surrounding structures were obtained without intravenous contrast. Angiographic images of the head were obtained using MRA technique without contrast. COMPARISON:  Head CT 06/16/2018 FINDINGS: MRI HEAD FINDINGS BRAIN: There is acute intraventricular hemorrhage, unchanged from the earlier head CT. Distribution of blood is unchanged. The midline structures are normal. There is an old right parietal lobe infarct. The white matter signal is normal for the patient's age. The cerebral and cerebellar volume are age-appropriate. No hydrocephalus. Susceptibility-sensitive sequences show no chronic microhemorrhage or superficial siderosis. No mass lesion. VASCULAR: The major intracranial arterial and venous  sinus flow voids are normal. SKULL AND UPPER CERVICAL  SPINE: Calvarial bone marrow signal is normal. There is no skull base mass. Visualized upper cervical spine and soft tissues are normal. SINUSES/ORBITS: No fluid levels or advanced mucosal thickening. No mastoid or middle ear effusion. The orbits are normal. MRA HEAD FINDINGS POSTERIOR CIRCULATION: --Vertebral arteries: Normal codominant configuration of V4 segments. --Posterior inferior cerebellar arteries (PICA): Patent origins from the vertebral arteries. --Anterior inferior cerebellar arteries (AICA): Patent origins from the basilar artery. --Basilar artery: Normal. --Superior cerebellar arteries: Normal. --Posterior cerebral arteries (PCA): Normal. Both are predominantly supplied by the posterior communicating arteries (p-comm). ANTERIOR CIRCULATION: --Intracranial internal carotid arteries: Normal. --Anterior cerebral arteries (ACA): Normal. Both A1 segments are present. Patent anterior communicating artery (a-comm). --Middle cerebral arteries (MCA): Normal. IMPRESSION: 1. Unchanged distribution of intraventricular hemorrhage, predominantly within the right lateral ventricle. No causative lesion identified. 2. No intracranial aneurysm, vascular malformation or occlusion. 3. 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 Electronically Signed   By: Ulyses Jarred M.D.   On: 06/16/2018 03:25   Dg Chest Portable 1 View  Result Date: 06/16/2018 CLINICAL DATA:  Cough, tachycardia EXAM: PORTABLE CHEST 1 VIEW COMPARISON:  12/18/2017 FINDINGS: Heart and mediastinal contours are within normal limits. No focal opacities or effusions. No acute bony abnormality. IMPRESSION: No active disease. Electronically Signed   By: Rolm Baptise M.D.   On: 06/16/2018 01:27    PHYSICAL EXAM  Temp:  [97.6 F (36.4 C)-98.1 F (36.7 C)] 97.7 F (36.5 C) (04/24 1200) Pulse Rate:  [79-112] 99 (04/24 1600) Resp:  [15-29] 28 (04/24 1600) BP: (95-169)/(54-95) 132/69 (04/24 1600) SpO2:  [92  %-100 %] 97 % (04/24 1600) Weight:  [65.8 kg] 65.8 kg (04/24 0024)  General - Well nourished, well developed, in no apparent distress.  Ophthalmologic - fundi not visualized due to noncooperation.  Cardiovascular - Regular rate and rhythm.  Mental Status -  Level of arousal and orientation to time, place, and person were intact. Language including expression, naming, repetition, comprehension was assessed and found intact. Fund of Knowledge was assessed and was intact.  Cranial Nerves II - XII - II - Visual field intact OU. III, IV, VI - Extraocular movements intact. V - Facial sensation intact bilaterally. VII - chronic left facial droop. VIII - Hearing & vestibular intact bilaterally. X - Palate elevates symmetrically. XI - Chin turning & shoulder shrug intact bilaterally. XII - Tongue protrusion intact.  Motor Strength - [name] strength was normal in all extremities and pronator drift was absent.  Bulk was normal and fasciculations were absent.   Motor Tone - Muscle tone was assessed at the neck and appendages and was normal.  Reflexes - [name] reflexes were symmetrical in all extremities and she had no pathological reflexes.  Sensory - Light touch, temperature/pinprick were assessed and were symmetrical.    Coordination - The patient had normal movements in the hands with no ataxia or dysmetria.  Tremor was absent.  Gait and Station - deferred.   ASSESSMENT/PLAN Ms. KHAILA VELARDE is a 78 y.o. female with history of hypertension, cataract on the left, macular degeneration on the right eye, right parietal stroke, right ICA stenosis s/p CEA admitted for headache. No tPA given due to IVH.    IVH:  right lateral ventricle IVH, source unclear, likely due to hypertensive  Resultant mild headache  CT head x2 left lateral ventricle IVH, stable, no hydrocephalus  MRI right IVH, old right parietal lobe infarct - no CAA  MRA no AVM or aneurysm or LVO  LDL  57  HgbA1c 8.2  SCDs for VTE prophylaxis  aspirin 81 mg daily and clopidogrel 75 mg daily prior to admission, now on No antithrombotic due to IVH  Ongoing aggressive stroke risk factor management  Therapy recommendations:  Pending   Disposition:  Pending   History of stroke and right CEA  Stroke in [place], [place] around 12/08/2015, with dysarthria and left facial droop. As per Dr. Tobey Grim note, she had right brain stroke and a 70% stenosis of the right ICA, 50% of ICA.  Had right CEA in 01/2016  Follow with vascular surgery Dr. Scot Dock  Last carotid Doppler in 07/2016 showed patent right CEA, left ICA 40 to 59% stenosis  Hidradenitis Suppurativa  Bilateral groins  On Abx - continue with levaquin and flagyl  Continue zinc cream  Wound consult recs appreciated  Hypertension . Stable . Off cleviprex . Resume home meds -losartan . Labetalol PRN  Long term BP goal normotensive  DM - new diagnosis  A1C 8.2, goal < 7.0  Hyperglycemia  SSI  CBG monitoring  Put on metformin 500mg  bid   Need PCP follow up and start medication  Tobacco abuse  Current smoker  Smoking cessation counseling provided  Pt is willing to quit  Other Stroke Risk Factors  Advanced age  Other Active Problems  Mild leukocytosis, WBC 11.5  Mild hyponatremia, Na 134  Hospital day # 0  This patient is critically ill due to IVH, hypertension, hyperglycemia and at significant risk of neurological worsening, death form recurrent IVH, obstructive hydrocephalus, brain edema, brain herniation, hypertensive encephalopathy, DKA. This patient's care requires constant monitoring of vital signs, hemodynamics, respiratory and cardiac monitoring, review of multiple databases, neurological assessment, discussion with family, other specialists and medical decision making of high complexity. I spent 35 minutes of neurocritical care time in the care of this patient.   Rosalin Hawking, MD PhD Stroke  Neurology 06/16/2018 6:37 PM    To contact Stroke Continuity provider, please refer to [email or web address] After hours, contact General Neurology
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
## 2018-06-16 NOTE — Evaluation (Signed)
Physical Therapy Evaluation Patient Details Name: Melinda Eaton MRN: [id number] DOB: 1940/07/05 Today's Date: 06/16/2018   History of Present Illness  Patient is a 78 y/o female who presents with HA, weakness and vomiting. Head CT- IVH right lateral ventricle, 3rd and 4th ventricles. PMH includes CVA, HTN, Hepatitis, macular degeneration, anxiety.   Clinical Impression  Patient presents with dizziness, impaired balance, groin pain, decreased activity tolerance and impaired mobility s/p above. Pt independent PTA and lives with son and daughter in law. Tolerated transfers and gait training with Min guard-Min A for balance/safety. Will likely do better with RW for support as pt holding onto IV pole and HHA for gait training today. HR ranged from 100-130s bpm with 2/4 DOE requiring standing rest breaks. Anticipate with increased activity, mobility and balance will improve. Pt home alone during the day and would benefit from HHPT to decrease fall risk and improve overall strength/mobility. Will follow acutely to maximize independence and mobility prior to return home.   Follow Up Recommendations Home health PT;Supervision for mobility/OOB    Equipment Recommendations  Rolling walker with 5" wheels    Recommendations for Other Services       Precautions / Restrictions Precautions Precautions: Fall Precaution Comments: watch HR Restrictions Weight Bearing Restrictions: No      Mobility  Bed Mobility Overal bed mobility: Needs Assistance Bed Mobility: Rolling;Sidelying to Sit Rolling: Min guard Sidelying to sit: Min assist;HOB elevated       General bed mobility comments: Increased time and use of rail to get to EOB. + dizziness. BP stable.  Transfers Overall transfer level: Needs assistance Equipment used: None Transfers: Sit to/from Stand Sit to Stand: Min assist         General transfer comment: Min A to steady in standing. Pt reaching for IV pole for support.    Ambulation/Gait Ambulation/Gait assistance: Min guard;Min assist Gait Distance (Feet): 150 Feet Assistive device: IV Pole Gait Pattern/deviations: Step-through pattern;Decreased stride length Gait velocity: decreased   General Gait Details: Slow, mildly unsteady gait with HHA and IV pole for support; 2 standing rest breaks due to 2/4 DOE. HR ranged from 105-130s bpm.   Stairs            Wheelchair Mobility    Modified Rankin (Stroke Patients Only) Modified Rankin (Stroke Patients Only) Pre-Morbid Rankin Score: Slight disability Modified Rankin: Moderately severe disability     Balance Overall balance assessment: Needs assistance;History of Falls Sitting-balance support: Feet supported;Single extremity supported Sitting balance-Leahy Scale: Fair Sitting balance - Comments: Min guard for static sitting balance. Improved when dizziness dissipated.   Standing balance support: During functional activity Standing balance-Leahy Scale: Poor Standing balance comment: Requires UE support for static and dynamic tasks.                              Pertinent Vitals/Pain Pain Assessment: Faces Faces Pain Scale: Hurts even more Pain Location: groin Pain Descriptors / Indicators: Sore Pain Intervention(s): Monitored during session;Repositioned    Home Living Family/patient expects to be discharged to:: Private residence Living Arrangements: Children(son and daughter in [employer]) Available Help at Discharge: Family;Available PRN/intermittently(they work) Type of Home: House Home Access: Stairs to enter Entrance Stairs-Rails: None Technical brewer of Steps: 1 Home Layout: One level Home Equipment: None      Prior Function Level of Independence: Independent         Comments: Daughter-in-law assists with medication prep and cooking. Does not  drive. Reports 1 fall tripping over stroller.     Hand Dominance   Dominant Hand: Right    Extremity/Trunk  Assessment   Upper Extremity Assessment Upper Extremity Assessment: Defer to OT evaluation    Lower Extremity Assessment Lower Extremity Assessment: Overall WFL for tasks assessed    Cervical / Trunk Assessment Cervical / Trunk Assessment: Kyphotic  Communication   Communication: No difficulties  Cognition Arousal/Alertness: Awake/alert Behavior During Therapy: WFL for tasks assessed/performed Overall Cognitive Status: History of cognitive impairments - at baseline                                 General Comments: A&Ox4; reports feeling slow at times and difficulty getting words out quickly but states this is from prior stroke.      General Comments General comments (skin integrity, edema, etc.): VSS throughout except elevated HR.    Exercises     Assessment/Plan    PT Assessment Patient needs continued PT services  PT Problem List Decreased balance;Pain;Cardiopulmonary status limiting activity;Decreased knowledge of use of DME;Decreased mobility;Decreased activity tolerance       PT Treatment Interventions DME instruction;Functional mobility training;Balance training;Patient/family education;Therapeutic activities;Gait training;Therapeutic exercise;Neuromuscular re-education    PT Goals (Current goals can be found in the Care Plan section)  Acute Rehab PT Goals Patient Stated Goal: to go home tomorrow PT Goal Formulation: With patient Time For Goal Achievement: 06/30/18 Potential to Achieve Goals: Good    Frequency Min 4X/week   Barriers to discharge Decreased caregiver support home alone during the day    Co-evaluation               AM-PAC PT "6 Clicks" Mobility  Outcome Measure Help needed turning from your back to your side while in a flat bed without using bedrails?: A Little Help needed moving from lying on your back to sitting on the side of a flat bed without using bedrails?: A Little Help needed moving to and from a bed to a chair  (including a wheelchair)?: A Little Help needed standing up from a chair using your arms (e.g., wheelchair or bedside chair)?: A Little Help needed to walk in hospital room?: A Little Help needed climbing 3-5 steps with a railing? : A Little 6 Click Score: 18    End of Session Equipment Utilized During Treatment: Gait belt Activity Tolerance: Patient tolerated treatment well Patient left: in chair;with call bell/phone within reach;with chair alarm set;with nursing/sitter in room Nurse Communication: Mobility status PT Visit Diagnosis: Unsteadiness on feet (R26.81);Difficulty in walking, not elsewhere classified (R26.2);Pain Pain - part of body: (groin)    Time: 8546-2703 PT Time Calculation (min) (ACUTE ONLY): 26 min   Charges:   PT Evaluation $PT Eval Moderate Complexity: 1 Mod PT Treatments $Gait Training: 8-22 mins        Wray Kearns, PT, DPT Acute Rehabilitation Services Pager [phone] Office [place] 06/16/2018, 3:36 PM

## 2018-06-16 NOTE — Progress Notes (Signed)
Inpatient Diabetes Program Recommendations  AACE/ADA: New Consensus Statement on Inpatient Glycemic Control (2015)  Target Ranges:  Prepandial:   less than 140 mg/dL      Peak postprandial:   less than 180 mg/dL (1-2 hours)      Critically ill patients:  140 - 180 mg/dL   Lab Results  Component Value Date   GLUCAP 160 (H) 06/16/2018   HGBA1C 8.2 (H) 06/16/2018    Review of Glycemic Control Results for JOELEEN, WORTLEY (MRN [id number]) as of 06/16/2018 09:27  Ref. Range 06/15/2018 23:57 06/16/2018 08:15  Glucose-Capillary Latest Ref Range: 70 - 99 mg/dL 199 (H) 160 (H)   Diabetes history: Type 2 DM, diet controlled Outpatient Diabetes medications: none Current orders for Inpatient glycemic control: Novolog 0-15 units TID  Inpatient Diabetes Program Recommendations:    A1C is up from last recorded of 6.9% from PCP note in 01/2018. Patient was started on oral Prednisone taper on 03/06/2018.   Since patient is NPO and pending swallow study, recommend switching correction to Novolog 0-9 units Q4H.   Thanks, Bronson Curb, MSN, RNC-OB Diabetes Coordinator [phone] (8a-5p)
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
## 2018-06-16 NOTE — Consult Note (Signed)
[place] Nurse wound consult note Reason for Consult:Chronic Hidradenitis. Nonintact skin to inguinal folds.  DOes not want to use skin fold management (interdry AG) as she has in the past and the drainage visibly bothered her  Moisture associated skin damage to gluteal folds near sacrum and upper buttocks. HAs external urinary manager in place.  Wound type:inflammatory Pressure Injury POA: NA Measurement:Scattered nonintact areas to pubis, inguinal folds and buttocks Wound KSH:NGITJLL and scabbed.  Pink and moist buttocks Drainage (amount, consistency, odor) moderate serosanguinous  No odor Periwound:erythema Dressing procedure/placement/frequency: Cleanse sacrum and perineal skin with soap and water and pat dry.  Apply Gerhardts butt paste twice daily and PRN soilage.  No disposable briefs or underpads.  Will not follow at this time.  Please re-consult if needed.  Domenic Moras MSN, RN, FNP-BC CWON Wound, Ostomy, Continence Nurse Pager [phone]
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
## 2018-06-16 NOTE — Progress Notes (Signed)
PT Cancellation Note  Patient Details Name: AIDE WOJNAR MRN: [id number] DOB: 20-Sep-1940   Cancelled Treatment:    Reason Eval/Treat Not Completed: Active bedrest order Will await increase in activity orders prior to PT evaluation.   Marguarite Arbour A Makayla Lanter 06/16/2018, 7:17 AM Wray [place], PT, DPT Acute Rehabilitation Services Pager [phone] Office [phone]

## 2018-06-16 NOTE — ED Notes (Signed)
Patient transported to CT and XR 

## 2018-06-16 NOTE — ED Notes (Signed)
Patient transported to MRI 
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
## 2018-06-16 NOTE — Progress Notes (Signed)
Arrived from [facility]N at 2025. Alert and oriented. Tele in placed.  Call light within reach.

## 2018-06-17 ENCOUNTER — Encounter (HOSPITAL_COMMUNITY): Payer: Self-pay | Admitting: *Deleted

## 2018-06-17 DIAGNOSIS — L8990 Pressure ulcer of unspecified site, unspecified stage: Secondary | ICD-10-CM | POA: Diagnosis present

## 2018-06-17 LAB — CBC
HCT: 31.7 % — ABNORMAL LOW (ref 36.0–46.0)
Hemoglobin: 10.5 g/dL — ABNORMAL LOW (ref 12.0–15.0)
MCH: 28.3 pg (ref 26.0–34.0)
MCHC: 33.1 g/dL (ref 30.0–36.0)
MCV: 85.4 fL (ref 80.0–100.0)
Platelets: 273 10*3/uL (ref 150–400)
RBC: 3.71 MIL/uL — ABNORMAL LOW (ref 3.87–5.11)
RDW: 13.9 % (ref 11.5–15.5)
WBC: 6.1 10*3/uL (ref 4.0–10.5)
nRBC: 0 % (ref 0.0–0.2)

## 2018-06-17 LAB — GLUCOSE, CAPILLARY
Glucose-Capillary: 124 mg/dL — ABNORMAL HIGH (ref 70–99)
Glucose-Capillary: 180 mg/dL — ABNORMAL HIGH (ref 70–99)
Glucose-Capillary: 77 mg/dL (ref 70–99)

## 2018-06-17 LAB — BASIC METABOLIC PANEL
Anion gap: 9 (ref 5–15)
BUN: 7 mg/dL — ABNORMAL LOW (ref 8–23)
CO2: 25 mmol/L (ref 22–32)
Calcium: 8.4 mg/dL — ABNORMAL LOW (ref 8.9–10.3)
Chloride: 106 mmol/L (ref 98–111)
Creatinine, Ser: 0.77 mg/dL (ref 0.44–1.00)
GFR calc Af Amer: >60 mL/min (ref >60)
GFR calc non Af Amer: >60 mL/min (ref >60)
Glucose, Bld: 117 mg/dL — ABNORMAL HIGH (ref 70–99)
Potassium: 3.3 mmol/L — ABNORMAL LOW (ref 3.5–5.1)
Sodium: 140 mmol/L (ref 135–145)

## 2018-06-17 MED ORDER — POTASSIUM CHLORIDE CRYS ER 20 MEQ PO TBCR
20.0000 meq | EXTENDED_RELEASE_TABLET | Freq: Three times a day (TID) | ORAL | Status: AC
  Administered 2018-06-17 – 2018-06-18 (×3): 20 meq via ORAL
  Filled 2018-06-17 (×3): qty 1

## 2018-06-17 NOTE — Discharge Instructions (Signed)
You were diagnosed with diabetes during your hospital stay. It will be important to follow up with your primary care provider within a week or two for further management of this condition.
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
## 2018-06-17 NOTE — Evaluation (Signed)
Occupational Therapy Evaluation Patient Details Name: Melinda Eaton MRN: [id number] DOB: 10-04-1940 Today's Date: 06/17/2018    History of Present Illness Patient is a 78 y/o female who presents with HA, weakness and vomiting. Head CT- IVH right lateral ventricle, 3rd and 4th ventricles. PMH includes CVA, HTN, Hepatitis, macular degeneration, anxiety.    Clinical Impression   Pt PTA: living with family and able to perform most ADL and IADL with little assist from family- pt is alone during the day. Pt currently performing ADL with supervisonA level with fair activity tolerance and mobility with RW with minguardA for stability. Pt reports no discomfort and fair ability to perform tasks. Pt would benefit from continued OT skilled services for energy conservation and continued ADL mobility and safety. OT to follow acutely.   Pt reported dizziness in standing at first, but no dizziness for other transfers or mobility.117/65 after exertion.    Follow Up Recommendations  Home health OT;Supervision - Intermittent    Equipment Recommendations  None recommended by OT    Recommendations for Other Services       Precautions / Restrictions Precautions Precautions: Fall Precaution Comments: watch HR Restrictions Weight Bearing Restrictions: No      Mobility Bed Mobility Overal bed mobility: Needs Assistance Bed Mobility: Rolling;Sidelying to Sit Rolling: Min guard Sidelying to sit: Min assist;HOB elevated       General bed mobility comments: elevating trunk and scooting BLEs towards EOB  Transfers Overall transfer level: Needs assistance Equipment used: Rolling walker (2 wheeled) Transfers: Sit to/from Stand Sit to Stand: Supervision         General transfer comment: Supervision for safety to stand with RW.     Balance Overall balance assessment: Needs assistance;History of Falls Sitting-balance support: Feet supported;No upper extremity supported Sitting balance-Leahy  Scale: Fair     Standing balance support: Bilateral upper extremity supported;During functional activity Standing balance-Leahy Scale: Poor Standing balance comment: Requires UE support for static and dynamic tasks.                            ADL either performed or assessed with clinical judgement   ADL Overall ADL's : At baseline                                       General ADL Comments: Pt requiring supervision for most tasks as her ambulation could improve for safety. Pt superivsion level for standing tasks;independent for seated activity.     Vision Baseline Vision/History: Wears glasses Wears Glasses: At all times Patient Visual Report: No change from baseline Vision Assessment?: No apparent visual deficits     Perception     Praxis      Pertinent Vitals/Pain Pain Assessment: No/denies pain     Hand Dominance Right   Extremity/Trunk Assessment Upper Extremity Assessment Upper Extremity Assessment: Generalized weakness   Lower Extremity Assessment Lower Extremity Assessment: Defer to PT evaluation   Cervical / Trunk Assessment Cervical / Trunk Assessment: Kyphotic   Communication Communication Communication: No difficulties   Cognition Arousal/Alertness: Awake/alert Behavior During Therapy: WFL for tasks assessed/performed Overall Cognitive Status: History of cognitive impairments - at baseline                                 General Comments: A&Ox4; reports  feeling slow at times and difficulty getting words out quickly but states this is from prior stroke.   General Comments       Exercises Exercises: General Lower Extremity General Exercises - Lower Extremity Ankle Circles/Pumps: PROM;Both;Seated;10 reps Long Arc Quad: AROM;Both;10 reps;Seated   Shoulder Instructions      Home Living Family/patient expects to be discharged to:: Private residence Living Arrangements: Children Available Help at Discharge:  Family;Available PRN/intermittently Type of Home: House Home Access: Stairs to enter [employer] of Steps: 2 Entrance Stairs-Rails: None Home Layout: One level     Bathroom Shower/Tub: Teacher, early years/pre: Handicapped height Bathroom Accessibility: Yes How Accessible: Accessible via walker Home Equipment: Shower seat      Lives With: Son    Prior Functioning/Environment Level of Independence: Independent with assistive device(s)        Comments: Daughter-in-law assists with medication prep and cooking. Does not drive. Reports 1 fall tripping over stroller.        OT Problem List: Decreased strength;Decreased activity tolerance;Impaired balance (sitting and/or standing)      OT Treatment/Interventions: Self-care/ADL training;Therapeutic exercise;Neuromuscular education;Energy conservation;Therapeutic activities;Patient/family education;Balance training    OT Goals(Current goals can be found in the care plan section) Acute Rehab OT Goals Patient Stated Goal: to go home OT Goal Formulation: With patient Time For Goal Achievement: 07/01/18 Potential to Achieve Goals: Good ADL Goals Additional ADL Goal #1: Pt will stand at sink x7 mins for ADL tasks with fair balance Additional ADL Goal #2: Pt will ambulate with least restrictive device a house hold distance with modified independence.  OT Frequency: Min 2X/week   Barriers to D/C: Decreased caregiver support  family is away at work throughout the day       Co-evaluation              AM-PAC OT "6 Clicks" Daily Activity     Outcome Measure Help from another person eating meals?: None Help from another person taking care of personal grooming?: A Little Help from another person toileting, which includes using toliet, bedpan, or urinal?: A Little Help from another person bathing (including washing, rinsing, drying)?: A Little Help from another person to put on and taking off regular upper  body clothing?: None Help from another person to put on and taking off regular lower body clothing?: A Little 6 Click Score: 20   End of Session Equipment Utilized During Treatment: Gait belt;Rolling walker Nurse Communication: Mobility status  Activity Tolerance: Patient tolerated treatment well Patient left: in chair;with call bell/phone within reach;with chair alarm set  OT Visit Diagnosis: Unsteadiness on feet (R26.81);Muscle weakness (generalized) (M62.81)                Time: 5436-0677 OT Time Calculation (min): 25 min Charges:  OT General Charges $OT Visit: 1 Visit OT Evaluation $OT Eval Moderate Complexity: 1 Mod OT Treatments $Self Care/Home Management : 8-22 mins  Ebony Hail Harold Hedge) Marsa Aris OTR/L Acute Rehabilitation Services Pager: [phone] Office: [place] 06/17/2018, 12:36 PM

## 2018-06-17 NOTE — Progress Notes (Signed)
Physical Therapy Treatment Patient Details Name: Melinda E[place] MRN: [id number] DOB: [birth year] Today's Date: 06/17/2018    History of Present Illness Patient is a 78 y/o female who presents with HA, weakness and vomiting. Head CT- IVH right lateral ventricle, 3rd and 4th ventricles. PMH includes CVA, HTN, Hepatitis, macular degeneration, anxiety.     PT Comments    Pt progressing towards goals. Pt with improved steadiness with use of RW. One LOB noted when turning, requiring min A. Otherwise required min guard A for steadying. Pt tended to drift to the R when ambulating. Educated about use of RW at home to improve safety. Will continue to follow acutely to maximize functional mobility independence and safety.     Follow Up Recommendations  Home health PT;Supervision for mobility/OOB     Equipment Recommendations  Rolling walker with 5" wheels    Recommendations for Other Services       Precautions / Restrictions Precautions Precautions: Fall Precaution Comments: watch HR Restrictions Weight Bearing Restrictions: No    Mobility  Bed Mobility               General bed mobility comments: In chair upon entry.   Transfers Overall transfer level: Needs assistance Equipment used: Rolling walker (2 wheeled) Transfers: Sit to/from Stand Sit to Stand: Supervision         General transfer comment: Supervision for safety to stand with RW.   Ambulation/Gait Ambulation/Gait assistance: Min guard;Min assist Gait Distance (Feet): 120 Feet Assistive device: Rolling walker (2 wheeled) Gait Pattern/deviations: Step-through pattern;Decreased stride length;Drifts right/left Gait velocity: decreased   General Gait Details: Improved steadiness with use of RW, however, pt tended to drift to the R with use of RW. Pt with 1 LOB when turning and required min A for steadying. Educated about using RW at home to improve steadiness.    Stairs             Wheelchair  Mobility    Modified Rankin (Stroke Patients Only) Modified Rankin (Stroke Patients Only) Pre-Morbid Rankin Score: Slight disability Modified Rankin: Moderately severe disability     Balance Overall balance assessment: Needs assistance;History of Falls Sitting-balance support: Feet supported;No upper extremity supported Sitting balance-Leahy Scale: Fair     Standing balance support: Bilateral upper extremity supported;During functional activity Standing balance-Leahy Scale: Poor Standing balance comment: Requires UE support for static and dynamic tasks.                             Cognition Arousal/Alertness: Awake/alert Behavior During Therapy: WFL for tasks assessed/performed Overall Cognitive Status: History of cognitive impairments - at baseline                                 General Comments: A&Ox4; reports feeling slow at times and difficulty getting words out quickly but states this is from prior stroke.      Exercises General Exercises - Lower Extremity Ankle Circles/Pumps: PROM;Both;Seated;10 reps Long Arc Quad: AROM;Both;10 reps;Seated    General Comments        Pertinent Vitals/Pain Pain Assessment: No/denies pain    Home Living Family/patient expects to be discharged to:: Private residence Living Arrangements: Children Available Help at Discharge: Family;Available PRN/intermittently Type of Home: House Home Access: Stairs to enter Entrance Stairs-Rails: None Home Layout: One level Home Equipment: Shower seat      Prior Function Level of Independence:  Independent with assistive device(s)      Comments: Daughter-in-law assists with medication prep and cooking. Does not drive. Reports 1 fall tripping over stroller.   PT Goals (current goals can now be found in the care plan section) Acute Rehab PT Goals Patient Stated Goal: to go home PT Goal Formulation: With patient Time For Goal Achievement: 06/30/18 Potential to  Achieve Goals: Good Progress towards PT goals: Progressing toward goals    Frequency    Min 4X/week      PT Plan Current plan remains appropriate    Co-evaluation              AM-PAC PT "6 Clicks" Mobility   Outcome Measure  Help needed turning from your back to your side while in a flat bed without using bedrails?: A Little Help needed moving from lying on your back to sitting on the side of a flat bed without using bedrails?: A Little Help needed moving to and from a bed to a chair (including a wheelchair)?: A Little Help needed standing up from a chair using your arms (e.g., wheelchair or bedside chair)?: A Little Help needed to walk in hospital room?: A Little Help needed climbing 3-5 steps with a railing? : A Little 6 Click Score: 18    End of Session Equipment Utilized During Treatment: Gait belt Activity Tolerance: Patient tolerated treatment well Patient left: in chair;with call bell/phone within reach;with chair alarm set Nurse Communication: Mobility status PT Visit Diagnosis: Unsteadiness on feet (R26.81);Difficulty in walking, not elsewhere classified (R26.2)     Time: 2536-6440 PT Time Calculation (min) (ACUTE ONLY): 14 min  Charges:  $Gait Training: 8-22 mins                     Leighton Ruff, PT, DPT  Acute Rehabilitation Services  Pager: [phone] Office: [phone]    Rudean Hitt 06/17/2018, 11:43 AM

## 2018-06-17 NOTE — Progress Notes (Signed)
STROKE TEAM PROGRESS NOTE   SUBJECTIVE (INTERVAL HISTORY) Her RN is at the bedside.  Overall her condition is improved.  No headache. Neuro intact. Repeat CT stable IVH without hydrocephalus. Needs home PT. However called and spoke to family and they are not comfortable with patient being alone in the house and need help getting someone to stay with her when she is discharged, will consult social work.   OBJECTIVE Temp:  [97.6 F (36.4 C)-98.7 F (37.1 C)] 98 F (36.7 C) (04/25 1130) Pulse Rate:  [88-99] 90 (04/25 1130) Cardiac Rhythm: Normal sinus rhythm (04/25 0820) Resp:  [15-28] 17 (04/25 1130) BP: (95-140)/(54-83) 137/79 (04/25 1130) SpO2:  [91 %-99 %] 99 % (04/25 1130)  Recent Labs  Lab 06/16/18 0815 06/16/18 1232 06/16/18 1657 06/16/18 2107 06/17/18 0627  GLUCAP 160* 92 226* 95 124*   Recent Labs  Lab 06/16/18 0003 06/17/18 0339  NA 134* 140  K 4.1 3.3*  CL 95* 106  CO2 23 25  GLUCOSE 218* 117*  BUN 9 7*  CREATININE 0.82 0.77  CALCIUM 9.3 8.4*   Recent Labs  Lab 06/16/18 0003  AST 26  ALT 16  ALKPHOS 67  BILITOT 0.7  PROT 7.4  ALBUMIN 3.1*   Recent Labs  Lab 06/16/18 0003 06/17/18 0339  WBC 11.5* 6.1  NEUTROABS 10.4*  --   HGB 13.1 10.5*  HCT 40.0 31.7*  MCV 86.0 85.4  PLT 280 273   No results for input(s): CKTOTAL, CKMB, CKMBINDEX, TROPONINI in the last 168 hours. Recent Labs    06/16/18 0003  LABPROT 14.4  INR 1.1   Recent Labs    06/16/18 1340  COLORURINE YELLOW  LABSPEC 1.003*  PHURINE 7.0  GLUCOSEU NEGATIVE  HGBUR MODERATE*  BILIRUBINUR NEGATIVE  KETONESUR NEGATIVE  PROTEINUR NEGATIVE  NITRITE NEGATIVE  LEUKOCYTESUR LARGE*       Component Value Date/Time   CHOL 106 06/16/2018 0003   TRIG 69 06/16/2018 0003   HDL 35 (L) 06/16/2018 0003   CHOLHDL 3.0 06/16/2018 0003   VLDL 14 06/16/2018 0003   LDLCALC 57 06/16/2018 0003   Lab Results  Component Value Date   HGBA1C 8.2 (H) 06/16/2018   No results found for:  LABOPIA, [place], [place], [place], [facility], [place]  Recent Labs  Lab 06/16/18 0003  ETH <10    IMAGING  I have personally reviewed the radiological images below and agree with the radiology interpretations.  Ct Head Wo Contrast 06/16/2018 IMPRESSION:  1. Unchanged intraventricular hemorrhage.  2. Unchanged size of the ventricles.  3. Chronic right MCA infarct.    Ct Head Wo Contrast 06/16/2018 IMPRESSION:  Isolated intraventricular hemorrhage, most pronounced in the right lateral ventricle. No visible intraparenchymal hemorrhage. Old right posterior MCA infarct, stable. Atrophy, chronic small vessel disease.    Mr [place] Head Wo Contrast 06/16/2018 IMPRESSION:  1. Unchanged distribution of intraventricular hemorrhage, predominantly within the right lateral ventricle. No causative lesion identified.  2. No intracranial aneurysm, vascular malformation or occlusion.  3. 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    Dg Chest Portable 1 View 06/16/2018 IMPRESSION:  No active disease.     PHYSICAL EXAM  Temp:  [97.6 F (36.4 C)-98.7 F (37.1 C)] 98 F (36.7 C) (04/25 1130) Pulse Rate:  [88-99] 90 (04/25 1130) Resp:  [15-28] 17 (04/25 1130) BP: (95-140)/(54-83) 137/79 (04/25 1130) SpO2:  [91 %-99 %] 99 % (04/25 1130)  General - Well nourished, well developed, in no apparent distress.  Ophthalmologic - fundi not visualized  due to noncooperation.  Cardiovascular - Regular rate and rhythm.  Mental Status -  Level of arousal and orientation to time, place, and person were intact. Language including expression, naming, repetition, comprehension was assessed and found intact. Fund of Knowledge was assessed and was intact.  Cranial Nerves II - XII - II - Visual field intact OU. III, IV, VI - Extraocular movements intact. V - Facial sensation intact bilaterally. VII - chronic left facial droop. VIII - Hearing & vestibular intact bilaterally. X - Palate elevates  symmetrically. XI - Chin turning & shoulder shrug intact bilaterally. XII - Tongue protrusion intact.  Motor Strength - [name] strength was normal in all extremities and pronator drift was absent.  Bulk was normal and fasciculations were absent.   Motor Tone - Muscle tone was assessed at the neck and appendages and was normal.  Reflexes - [name] reflexes were symmetrical in all extremities and she had no pathological reflexes.  Sensory - Light touch, temperature/pinprick were assessed and were symmetrical.    Coordination - The patient had normal movements in the hands with no ataxia or dysmetria.  Tremor was absent.  Gait and Station - deferred.   ASSESSMENT/PLAN [name] is a 78 y.o. female with history of hypertension, cataract on the left, macular degeneration on the right eye, right parietal stroke, right ICA stenosis s/p CEA admitted for headache. No tPA given due to IVH.    IVH:  right lateral ventricle IVH, source unclear, likely due to hypertensive  Resultant mild headache  CT head x2 left lateral ventricle IVH, stable, no hydrocephalus  MRI right IVH, old right parietal lobe infarct - no CAA  MRA no AVM or aneurysm or LVO  LDL 57  HgbA1c 8.2  SCDs for VTE prophylaxis  aspirin 81 mg daily and clopidogrel 75 mg daily prior to admission, now on No antithrombotic due to IVH  Ongoing aggressive stroke risk factor management  Therapy recommendations:  Pending   Disposition:  Pending   History of stroke and right CEA  [place]roke in [place], [place] around 12/08/2015, with dysarthria and left facial droop. As per Dr. Tobey E[place] note, she had right brain stroke and a 70% stenosis of the right ICA, 50% of ICA.  Had right CEA in 01/2016  Follow with vascular surgery Dr. Scot E[place]  Last carotid Doppler in 07/2016 showed patent right CEA, left ICA 40 to 59% stenosis  Hidradenitis Suppurativa  Bilateral groins  On Abx - continue with levaquin and  flagyl  Continue zinc cream  Wound consult recs appreciated  Hypertension . Stable . Off cleviprex . Resume home meds -losartan . Labetalol PRN  Long term BP goal normotensive  DM - new diagnosis  A1C 8.2, goal < 7.0  Hyperglycemia  SSI  CBG monitoring  Put on metformin 500mg  bid   Need PCP follow up and start medication  Tobacco abuse  Current smoker  Smoking cessation counseling provided  Pt is willing to quit  Other Stroke Risk Factors  Advanced age  Other Active Problems  Mild leukocytosis, WBC 11.5->6.1  Mild hyponatremia, Na 134  Hypokalemia - 3.3 - supplement and recheck in AM  Anemia - 13.1->10.5 - recheck in AM  Hospital day # 1 Repeat CT stable IVH without hydrocephalus. Needs home PT. However called and spoke to family and they are not comfortable with patient being alone in the house and need help getting someone to stay with her when she is discharged, will consult social work.  Personally examined patient and images, and have participated in and made any corrections needed to history, physical, neuro exam,assessment and plan as stated above.  I have personally obtained the history, evaluated lab date, reviewed imaging studies and agree with radiology interpretations.    Sarina Ill, MD Stroke Neurology   A total of 25 minutes was spent for the care of this patient, spent on counseling patient and family on different diagnostic and therapeutic options, counseling and coordination of care, riskd ans benefits of management, compliance, or risk factor reduction and education.  To contact Stroke Continuity provider, please refer to [email or web address] After hours, contact General Neurology

## 2018-06-18 LAB — GLUCOSE, CAPILLARY
Glucose-Capillary: 95 mg/dL (ref 70–99)
Glucose-Capillary: 120 mg/dL — ABNORMAL HIGH (ref 70–99)
Glucose-Capillary: 94 mg/dL (ref 70–99)

## 2018-06-18 LAB — CBC
HCT: 32.7 % — ABNORMAL LOW (ref 36.0–46.0)
Hemoglobin: 10.9 g/dL — ABNORMAL LOW (ref 12.0–15.0)
MCH: 28.6 pg (ref 26.0–34.0)
MCHC: 33.3 g/dL (ref 30.0–36.0)
MCV: 85.8 fL (ref 80.0–100.0)
Platelets: 256 10*3/uL (ref 150–400)
RBC: 3.81 MIL/uL — ABNORMAL LOW (ref 3.87–5.11)
RDW: 14.2 % (ref 11.5–15.5)
WBC: 8 10*3/uL (ref 4.0–10.5)
nRBC: 0 % (ref 0.0–0.2)

## 2018-06-18 LAB — BASIC METABOLIC PANEL
Anion gap: 9 (ref 5–15)
BUN: 10 mg/dL (ref 8–23)
CO2: 21 mmol/L — ABNORMAL LOW (ref 22–32)
Calcium: 8.1 mg/dL — ABNORMAL LOW (ref 8.9–10.3)
Chloride: 105 mmol/L (ref 98–111)
Creatinine, Ser: 0.91 mg/dL (ref 0.44–1.00)
GFR calc Af Amer: >60 mL/min (ref >60)
GFR calc non Af Amer: >60 mL/min (ref >60)
Glucose, Bld: 107 mg/dL — ABNORMAL HIGH (ref 70–99)
Potassium: 4.7 mmol/L (ref 3.5–5.1)
Sodium: 135 mmol/L (ref 135–145)

## 2018-06-18 MED ORDER — LEVOFLOXACIN 500 MG PO TABS
500.0000 mg | ORAL_TABLET | Freq: Every day | ORAL | Status: DC
  Administered 2018-06-19 – 2018-06-20 (×2): 500 mg via ORAL
  Filled 2018-06-18 (×2): qty 1

## 2018-06-18 MED ORDER — NICOTINE 14 MG/24HR TD PT24
14.0000 mg | MEDICATED_PATCH | Freq: Every day | TRANSDERMAL | Status: DC
  Administered 2018-06-18 – 2018-06-20 (×3): 14 mg via TRANSDERMAL
  Filled 2018-06-18 (×3): qty 1

## 2018-06-18 MED ORDER — PNEUMOCOCCAL VAC POLYVALENT 25 MCG/0.5ML IJ INJ
0.5000 mL | INJECTION | INTRAMUSCULAR | Status: DC
  Filled 2018-06-18 (×2): qty 0.5

## 2018-06-18 NOTE — Progress Notes (Signed)
STROKE TEAM PROGRESS NOTE   SUBJECTIVE (INTERVAL HISTORY) Her RN is at the bedside.  Overall her condition is improved.  No headache. Neuro intact. Repeat CT stable IVH without hydrocephalus. Needs home PT. However called and spoke to family and they are not comfortable with patient being alone in the house and need help getting someone to stay with her when she is discharged, social work consulted.   OBJECTIVE Temp:  [97.9 F (36.6 C)-98.8 F (37.1 C)] 97.9 F (36.6 C) (04/26 1542) Pulse Rate:  [83-101] 99 (04/26 1542) Cardiac Rhythm: Ventricular tachycardia (04/26 1024) Resp:  [14-17] 15 (04/26 1542) BP: (119-155)/(33-73) 119/64 (04/26 1542) SpO2:  [96 %-99 %] 99 % (04/26 1542)  Recent Labs  Lab 06/17/18 0627 06/17/18 1159 06/17/18 1639 06/18/18 0640 06/18/18 1629  GLUCAP 124* 180* 77 95 120*   Recent Labs  Lab 06/16/18 0003 06/17/18 0339 06/18/18 0413  NA 134* 140 135  K 4.1 3.3* 4.7  CL 95* 106 105  CO2 23 25 21*  GLUCOSE 218* 117* 107*  BUN 9 7* 10  CREATININE 0.82 0.77 0.91  CALCIUM 9.3 8.4* 8.1*   Recent Labs  Lab 06/16/18 0003  AST 26  ALT 16  ALKPHOS 67  BILITOT 0.7  PROT 7.4  ALBUMIN 3.1*   Recent Labs  Lab 06/16/18 0003 06/17/18 0339 06/18/18 0413  WBC 11.5* 6.1 8.0  NEUTROABS 10.4*  --   --   HGB 13.1 10.5* 10.9*  HCT 40.0 31.7* 32.7*  MCV 86.0 85.4 85.8  PLT 280 273 256   No results for input(s): CKTOTAL, CKMB, CKMBINDEX, TROPONINI in the last 168 hours. Recent Labs    06/16/18 0003  LABPROT 14.4  INR 1.1   Recent Labs    06/16/18 1340  COLORURINE YELLOW  LABSPEC 1.003*  PHURINE 7.0  GLUCOSEU NEGATIVE  HGBUR MODERATE*  BILIRUBINUR NEGATIVE  KETONESUR NEGATIVE  PROTEINUR NEGATIVE  NITRITE NEGATIVE  LEUKOCYTESUR LARGE*       Component Value Date/Time   CHOL 106 06/16/2018 0003   TRIG 69 06/16/2018 0003   HDL 35 (L) 06/16/2018 0003   CHOLHDL 3.0 06/16/2018 0003   VLDL 14 06/16/2018 0003   LDLCALC 57 06/16/2018 0003    Lab Results  Component Value Date   HGBA1C 8.2 (H) 06/16/2018   No results found for: LABOPIA, [place], [place], [place], [facility], [place]  Recent Labs  Lab 06/16/18 0003  ETH <10    IMAGING  I have personally reviewed the radiological images below and agree with the radiology interpretations.  Ct Head Wo Contrast 06/16/2018 IMPRESSION:  1. Unchanged intraventricular hemorrhage.  2. Unchanged size of the ventricles.  3. Chronic right MCA infarct.    Ct Head Wo Contrast 06/16/2018 IMPRESSION:  Isolated intraventricular hemorrhage, most pronounced in the right lateral ventricle. No visible intraparenchymal hemorrhage. Old right posterior MCA infarct, stable. Atrophy, chronic small vessel disease.    Mr [place] Head Wo Contrast 06/16/2018 IMPRESSION:  1. Unchanged distribution of intraventricular hemorrhage, predominantly within the right lateral ventricle. No causative lesion identified.  2. No intracranial aneurysm, vascular malformation or occlusion.  3. 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    Dg Chest Portable 1 View 06/16/2018 IMPRESSION:  No active disease.     PHYSICAL EXAM  Temp:  [97.9 F (36.6 C)-98.8 F (37.1 C)] 97.9 F (36.6 C) (04/26 1542) Pulse Rate:  [83-101] 99 (04/26 1542) Resp:  [14-17] 15 (04/26 1542) BP: (119-155)/(33-73) 119/64 (04/26 1542) SpO2:  [96 %-99 %] 99 % (04/26  28)  General - Well nourished, well developed, in no apparent distress.  Ophthalmologic - fundi not visualized due to noncooperation.  Cardiovascular - Regular rate and rhythm.  Mental Status -  Level of arousal and orientation to time, place, and person were intact. Language including expression, naming, repetition, comprehension was assessed and found intact. Fund of Knowledge was assessed and was intact.  Cranial Nerves II - XII - II - Visual field intact OU. III, IV, VI - Extraocular movements intact. V - Facial sensation intact bilaterally. VII - chronic left  facial droop. VIII - Hearing & vestibular intact bilaterally. X - Palate elevates symmetrically. XI - Chin turning & shoulder shrug intact bilaterally. XII - Tongue protrusion intact.  Motor Strength - [name] strength was normal in all extremities and pronator drift was absent.  Bulk was normal and fasciculations were absent.   Motor Tone - Muscle tone was assessed at the neck and appendages and was normal.  Reflexes - [name] reflexes were symmetrical in all extremities and she had no pathological reflexes.  Sensory - Light touch, temperature/pinprick were assessed and were symmetrical.    Coordination - The patient had normal movements in the hands with no ataxia or dysmetria.  Tremor was absent.  Gait and Station - deferred.   ASSESSMENT/PLAN [name] is a 78 y.o. female with history of hypertension, cataract on the left, macular degeneration on the right eye, right parietal stroke, right ICA stenosis s/p CEA admitted for headache. No tPA given due to IVH.    IVH:  right lateral ventricle IVH, source unclear, likely due to hypertensive  Resultant mild headache  CT head x2 left lateral ventricle IVH, stable, no hydrocephalus  MRI right IVH, old right parietal lobe infarct - no CAA  MRA no AVM or aneurysm or LVO  LDL 57  HgbA1c 8.2  SCDs for VTE prophylaxis  aspirin 81 mg daily and clopidogrel 75 mg daily prior to admission, now on No antithrombotic due to IVH  Ongoing aggressive stroke risk factor management  Therapy recommendations: HH PT and OT   Disposition:  Pending   History of stroke and right CEA  Stroke in [place], [place] around 12/08/2015, with dysarthria and left facial droop. As per Dr. Tobey Grim note, she had right brain stroke and a 70% stenosis of the right ICA, 50% of ICA.  Had right CEA in 01/2016  Follow with vascular surgery Dr. Scot Dock  Last carotid Doppler in 07/2016 showed patent right CEA, left ICA 40 to 59%  stenosis  Hidradenitis Suppurativa  Bilateral groins  On Abx - continue with levaquin and flagyl  Continue zinc cream  Wound consult recs appreciated  Hypertension . Stable . Off cleviprex . Resume home meds -losartan . Labetalol PRN  Long term BP goal normotensive  DM - new diagnosis  A1C 8.2, goal < 7.0  Hyperglycemia  SSI  CBG monitoring  Put on metformin 500mg  bid   Need PCP follow up and start medication  Tobacco abuse  Current smoker  Smoking cessation counseling provided  Pt is willing to quit  Other Stroke Risk Factors  Advanced age  Other Active Problems  Mild leukocytosis, WBC 11.5->6.1->8.0  Mild hyponatremia, Na 134->140->135  Hypokalemia - 3.3 - supplement and recheck -> 4.7  Anemia - 13.1->10.5 ->10.9  PLAN  Discharge summary shared under incomplete notes.  Social worker has been asked to talk to family regarding their care needs for pt since they are hesitant to proceed with  discharge.    Hospital day # 2 Repeat CT stable IVH without hydrocephalus. Needs home PT. However called and spoke to family and they are not comfortable with patient being alone in the house and need help getting someone to stay with her when she is discharged, will consult social work.  Personally examined patient and images, and have participated in and made any corrections needed to history, physical, neuro exam,assessment and plan as stated above.  I have personally obtained the history, evaluated lab date, reviewed imaging studies and agree with radiology interpretations.    A total of 15 minutes was spent for the care of this patient. To contact Stroke Continuity provider, please refer to [email or web address] After hours, contact General Neurology

## 2018-06-18 NOTE — NC FL2 (Signed)
[place] LEVEL OF CARE SCREENING TOOL     IDENTIFICATION  Patient Name: Melinda E[place] Birthdate: 05-May-1940 Sex: female Admission Date (Current Location): 06/15/2018  [facility] and [place] Number:  Herbalist and Address:  [facility] [place], [place] [address] [place], [place] [address]      Provider Number: [id number]  Attending Physician Name and Address:  Rosalin Hawking, MD  Relative Name and Phone Number:  Dorian Pod and Elmyra Ricks and daughter in law, [phone]    Current Level of Care: Hospital Recommended Level of Care: [place] Prior Approval Number:    Date Approved/Denied:   PASRR Number: Under Manual Review  Discharge Plan: SNF    Current Diagnoses: Patient Active Problem List   Diagnosis Date Noted  . Pressure injury of skin 06/17/2018  . ICH (intracerebral hemorrhage) ([place]) 06/16/2018  . UTI (urinary tract infection) 12/18/2017  . Heme positive stool 12/17/2017  . Hypertension 12/17/2017  . Symptomatic stenosis of right carotid artery 01/27/2016  . 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 Tobacco abuse 12/29/2015  . Hidradenitis suppurativa 03/29/2013    Orientation RESPIRATION BLADDER Height & Weight     Self, Time, Situation, Place  Normal Continent Weight: 145 lb (65.8 kg) Height:  5\' 3"  (160 cm)  BEHAVIORAL SYMPTOMS/MOOD NEUROLOGICAL BOWEL NUTRITION STATUS      Continent Diet(cardiac diet, thin liquids)  AMBULATORY STATUS COMMUNICATION OF NEEDS Skin   Limited Assist Verbally Other (Comment)(MASD on groin/buttocks, pressure injurt on sacrum, open edema on vagina)                       Personal Care Assistance Level of Assistance  Bathing, Feeding, Dressing, Total care Bathing Assistance: Limited assistance Feeding assistance: Independent Dressing Assistance: Limited assistance Total Care Assistance: Limited assistance   Functional Limitations Info  Sight,  Hearing, Speech Sight Info: Impaired(macular degeneration) Hearing Info: Adequate Speech Info: Adequate    SPECIAL CARE FACTORS FREQUENCY  PT (By licensed PT), OT (By licensed OT)     PT Frequency: 5x/wk OT Frequency: 5x/wk            Contractures Contractures Info: Not present    Additional Factors Info  Code Status, Allergies Code Status Info: Full Code Allergies Info:  Bactrim Sulfamethoxazole-trimethoprim, Doxycycline           Current Medications (06/18/2018):  This is the current hospital active medication list Current Facility-Administered Medications  Medication Dose Route Frequency Provider Last Rate Last Dose  . acetaminophen (TYLENOL) tablet 650 mg  650 mg Oral Q4H PRN Kerney Elbe, MD      . finasteride (PROSCAR) tablet 5 mg  5 mg Oral Daily Kerney Elbe, MD   5 mg at 06/18/18 1116  . insulin aspart (novoLOG) injection 0-9 Units  0-9 Units Subcutaneous TID WC Rosalin Hawking, MD   1 Units at 06/18/18 1216  . labetalol (NORMODYNE) injection 5-20 mg  5-20 mg Intravenous Q2H PRN Rosalin Hawking, MD      . Derrill Memo ON 06/19/2018] levofloxacin (LEVAQUIN) tablet 500 mg  500 mg Oral Daily Rosalin Hawking, MD      . losartan (COZAAR) tablet 50 mg  50 mg Oral BID Rosalin Hawking, MD   50 mg at 06/18/18 1117  . metFORMIN (GLUCOPHAGE) tablet 500 mg  500 mg Oral BID WC Rosalin Hawking, MD   500 mg at 06/18/18 1617  . metroNIDAZOLE (FLAGYL) tablet 500 mg  500 mg Oral TID Rosalin Hawking, MD   500 mg at 06/18/18 1617  . nicotine (NICODERM CQ - dosed in mg/24 hours) patch 14 mg  14 mg Transdermal Daily Rinehuls, David L, PA-C      . pantoprazole (PROTONIX) EC tablet 40 mg  40 mg Oral Daily Rosalin Hawking, MD   40 mg at 06/18/18 1117  . senna-docusate (Senokot-S) tablet 1 tablet  1 tablet Oral BID Kerney Elbe, MD   1 tablet at 06/18/18 1117  . zinc oxide (BALMEX) 11.3 % cream   Topical PRN Rosalin Hawking, MD         Discharge Medications: Please see discharge summary for a list of discharge  medications.  Relevant Imaging Results:  Relevant Lab Results:   Additional Information SSN: [id number]  [place], LCSWA

## 2018-06-18 NOTE — TOC Progression Note (Signed)
Transition of Care [facility]) - Progression Note    Patient Details  Name: NORITA MEIGS MRN: [id number] Date of Birth: 1940-05-03  Transition of Care [facility]) CM/SW [place], [place] Phone Number: 06/18/2018, 5:41 PM  Clinical Narrative:     Family and patient now agreeable to SNF. Worked the patient up and faxed out. Family's preference of facilities would be higher rated, received permission to fax out to all. Daughter-in-law, Dorian Pod, would liked to be call with bed offers, she will help the patient make the decision.   CMS list left at bedside for patient and put on the patient's chart.   Expected Discharge Plan: Skilled Nursing Facility Barriers to Discharge: Insurance Authorization  Expected Discharge Plan and Services Expected Discharge Plan: [place] In-house Referral: Clinical Social Work   Post Acute Care Choice: [place] Living arrangements for the past 2 months: Single Family Home                 DME Arranged: Walker rolling with seat DME Agency: NA       HH Arranged: NA HH Agency: NA         Social Determinants of Health (SDOH) Interventions    Readmission Risk Interventions Readmission Risk Prevention Plan 06/18/2018  Transportation Screening Complete  PCP or Specialist Appt within 5-7 Days Complete  Home Care Screening Complete  Medication Review (RN CM) Complete  Some recent data might be hidden

## 2018-06-18 NOTE — Plan of Care (Signed)
Nurse reports patient had 6 beats of non sustained v tach while up walking. Pt was asymptomatic and not aware of episode. Telemetry strips have not yet been posted but I will review them. Will order a trans thoracic echo to check EF and look for other abnormalities. Pt also requested nicotine patch.  Mikey Bussing PA-C [facility] Pager [phone] 06/18/2018, 4:37 PM

## 2018-06-18 NOTE — TOC Initial Note (Addendum)
Transition of Care [facility]) - Initial/Assessment Note    Patient Details  Name: Melinda Eaton MRN: [id number] Date of Birth: 02/14/1941  Transition of Care [facility]) CM/SW Contact:    Gelene Mink, [place] Phone Number: 06/18/2018, 11:11 AM  Clinical Narrative:                  CSW called and spoke with the patient's daughter in law and son based upon consult from Neurology PA. Patient's son is Ronalee Belts and daughter in Sports coach is Dorian Pod. Both feel that it is not safe for the patient to return home at this time. CSW spoke with them about home health, the current therapy recommendation. They stated that they do not currently have any home services for the patient. CSW stated that even with home health, there would not be staff with the patient every day. They may be able to have an RN and aide that might come a few times a week for a few hours. They are concerned because this is the patient's second stroke. They stated that they work 8-10 hours a day and can't come home to assist the patient. CSW asked them if they would like to consider SNF. The patient does qualify with being able to walk 120 feet. They stated that they would like to consider it but the patient has never been to SNF before.   CSW gave them the options of maximizing home health or SNF. They would like the day to think about it. If the family and patient decide on SNF,they would like the patient to go to the best of care facility.   CSW went and spoke with the patient about the concerns that her family has. The patient wants to return home. She does not want to go to SNF.   Disposition is still uncertain. Please follow up with patient's son about his decision.   CSW completed readmission risk assessment with the patient at bedside.    Expected Discharge Plan: [place] Barriers to Discharge: Continued Medical Work up, Family Issues, Unsafe home situation(Pt wants to return home. Pt lives with son and daughter in law.  Both son and daughter in law work full time and don't think it is safe for patient to be alone all day. )   Patient Goals and CMS Choice Patient states their goals for this hospitalization and ongoing recovery are:: Pt wants to return home.  CMS Medicare.gov Compare Post Acute Care list provided to:: Patient Choice offered to / list presented to : Patient  Expected Discharge Plan and Services Expected Discharge Plan: [place] In-house Referral: Clinical Social Work   Post Acute Care Choice: [place] arrangements for the past 2 months: [place]                 DME Arranged: Walker rolling with seat DME Agency: NA       HH Arranged: NA HH Agency: NA        Prior Living Arrangements/Services Living arrangements for the past 2 months: Single Family Home Lives with:: Adult Children, Self Patient language and need for interpreter reviewed:: No        Need for Family Participation in Patient Care: No (Comment)(Pt lives with son) Care giver support system in place?: Yes (comment)   Criminal Activity/Legal Involvement Pertinent to Current Situation/Hospitalization: No - Comment as needed  Activities of Daily Living Home Assistive Devices/Equipment: None ADL Screening (condition at time of  admission) Patient's cognitive ability adequate to safely complete daily activities?: Yes Is the patient deaf or have difficulty hearing?: No Does the patient have difficulty seeing, even when wearing glasses/contacts?: No(wear reading glasses non prescription) Does the patient have difficulty concentrating, remembering, or making decisions?: Yes Patient able to express need for assistance with ADLs?: Yes Does the patient have difficulty dressing or bathing?: No Independently performs ADLs?: Yes (appropriate for developmental age) Does the patient have difficulty walking or climbing stairs?: Yes Weakness of Legs: None Weakness of Arms/Hands:  None  Permission Sought/Granted Permission sought to share information with : Case Manager Permission granted to share information with : Yes, Verbal Permission Granted  Share Information with NAME: Ronalee Belts and Dorian Pod   Permission granted to share info w AGENCY: SNF  Permission granted to share info w Relationship: Son and daughter in law  Permission granted to share info w Contact Information: [phone]  Emotional Assessment Appearance:: Appears stated age Attitude/Demeanor/Rapport: Apprehensive Affect (typically observed): Accepting Orientation: : Oriented to Self, Oriented to Place, Oriented to  Time, Oriented to Situation Alcohol / Substance Use: Not Applicable Psych Involvement: No (comment)  Admission diagnosis:  Intraventricular hemorrhage ([place]) [I61.5] Patient Active Problem List   Diagnosis Date Noted  . Pressure injury of skin 06/17/2018  . ICH (intracerebral hemorrhage) ([place]) 06/16/2018  . UTI (urinary tract infection) 12/18/2017  . Heme positive stool 12/17/2017  . Hypertension 12/17/2017  . Symptomatic stenosis of right carotid artery 01/27/2016  . 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 Tobacco abuse 12/29/2015  . Hidradenitis suppurativa 03/29/2013   PCP:  Christain [place], MD Pharmacy:   [facility] [address] - [place], [place]. [place] [address] Phone: [phone] Fax: [place], [place] [address] Phone: [phone] Fax: [phone]     Social Determinants of Health (SDOH) Interventions    Readmission Risk Interventions Readmission Risk Prevention Plan 06/18/2018  Transportation Screening Complete  PCP or Specialist Appt within 5-7 Days Complete  Home Care Screening Complete  Medication Review (RN CM) Complete  Some recent data might be hidden

## 2018-06-19 ENCOUNTER — Encounter (HOSPITAL_COMMUNITY): Payer: Self-pay | Admitting: Nurse Practitioner

## 2018-06-19 ENCOUNTER — Inpatient Hospital Stay (HOSPITAL_COMMUNITY): Payer: Medicare PPO

## 2018-06-19 DIAGNOSIS — D649 Anemia, unspecified: Secondary | ICD-10-CM | POA: Diagnosis present

## 2018-06-19 DIAGNOSIS — I639 Cerebral infarction, unspecified: Secondary | ICD-10-CM

## 2018-06-19 DIAGNOSIS — E871 Hypo-osmolality and hyponatremia: Secondary | ICD-10-CM | POA: Diagnosis present

## 2018-06-19 DIAGNOSIS — E119 Type 2 diabetes mellitus without complications: Secondary | ICD-10-CM

## 2018-06-19 DIAGNOSIS — E785 Hyperlipidemia, unspecified: Secondary | ICD-10-CM | POA: Diagnosis present

## 2018-06-19 HISTORY — DX: Type 2 diabetes mellitus without complications: E11.9

## 2018-06-19 LAB — GLUCOSE, CAPILLARY
Glucose-Capillary: 120 mg/dL — ABNORMAL HIGH (ref 70–99)
Glucose-Capillary: 109 mg/dL — ABNORMAL HIGH (ref 70–99)
Glucose-Capillary: 94 mg/dL (ref 70–99)
Glucose-Capillary: 126 mg/dL — ABNORMAL HIGH (ref 70–99)

## 2018-06-19 LAB — ECHOCARDIOGRAM LIMITED BUBBLE STUDY
Height: 63 in
Weight: 2320 oz

## 2018-06-19 LAB — BASIC METABOLIC PANEL
Anion gap: 9 (ref 5–15)
BUN: 10 mg/dL (ref 8–23)
CO2: 24 mmol/L (ref 22–32)
Calcium: 8.5 mg/dL — ABNORMAL LOW (ref 8.9–10.3)
Chloride: 105 mmol/L (ref 98–111)
Creatinine, Ser: 1.04 mg/dL — ABNORMAL HIGH (ref 0.44–1.00)
GFR calc Af Amer: 60 mL/min — ABNORMAL LOW (ref >60)
GFR calc non Af Amer: 51 mL/min — ABNORMAL LOW (ref >60)
Glucose, Bld: 108 mg/dL — ABNORMAL HIGH (ref 70–99)
Potassium: 4.1 mmol/L (ref 3.5–5.1)
Sodium: 138 mmol/L (ref 135–145)

## 2018-06-19 LAB — CBC
HCT: 32.7 % — ABNORMAL LOW (ref 36.0–46.0)
Hemoglobin: 10.7 g/dL — ABNORMAL LOW (ref 12.0–15.0)
MCH: 28.2 pg (ref 26.0–34.0)
MCHC: 32.7 g/dL (ref 30.0–36.0)
MCV: 86.3 fL (ref 80.0–100.0)
Platelets: 242 10*3/uL (ref 150–400)
RBC: 3.79 MIL/uL — ABNORMAL LOW (ref 3.87–5.11)
RDW: 14.1 % (ref 11.5–15.5)
WBC: 8.1 10*3/uL (ref 4.0–10.5)
nRBC: 0 % (ref 0.0–0.2)

## 2018-06-19 MED ORDER — POLYETHYLENE GLYCOL 3350 17 G PO PACK
17.0000 g | PACK | Freq: Once | ORAL | Status: AC
  Administered 2018-06-19: 17 g via ORAL
  Filled 2018-06-19: qty 1

## 2018-06-19 MED ORDER — ALUM & MAG HYDROXIDE-SIMETH 200-200-20 MG/5ML PO SUSP
30.0000 mL | Freq: Four times a day (QID) | ORAL | Status: DC | PRN
  Administered 2018-06-19 (×2): 30 mL via ORAL
  Filled 2018-06-19 (×2): qty 30

## 2018-06-19 NOTE — TOC Transition Note (Signed)
Transition of Care [facility]) - CM/SW Discharge Note   Patient Details  Name: Melinda Eaton MRN: [id number] Date of Birth: 17-Dec-1940  Transition of Care [facility]) CM/SW Contact:  Geralynn Ochs, LCSW Phone Number: 06/19/2018, 4:40 PM   Clinical Narrative:  Patient discharging home with home health; patient does not meet criteria for SNF placement. Patient and family aware, MD discharging home with home health. Family to provide walker for the patient. Family has chosen [facility], they will follow.     Final next level of care: [place] Barriers to Discharge: No Barriers Identified   Patient Goals and CMS Choice Patient states their goals for this hospitalization and ongoing recovery are:: Pt wants to go home. Pt son wants his mother to get rehab CMS Medicare.gov Compare Post Acute Care list provided to:: Patient Choice offered to / list presented to : Patient  Discharge Placement                       Discharge Plan and Services In-house Referral: Clinical Social Work   Post Acute Care Choice: [place]          DME Arranged: [place] Rile rolling DME Agency: NA       HH Arranged: OT, PT, Nurse's Aide [place] Agency: [place] Date [place]: 06/19/18 Time HH Agency Contacted: 1640 Representative spoke with at [place]: [place] ([place]) Interventions     Readmission Risk Interventions Readmission Risk Prevention Plan 06/18/2018  Transportation Screening Complete  PCP or Specialist Appt within 5-7 Days Complete  Home Care Screening Complete  Medication Review (RN CM) Complete  Some recent data might be hidden

## 2018-06-19 NOTE — Progress Notes (Signed)
  Echocardiogram 2D Echocardiogram has been performed.  Madelaine Etienne 06/19/2018, 11:01 AM

## 2018-06-19 NOTE — Progress Notes (Addendum)
Occupational Therapy Treatment Patient Details Name: Melinda Eaton MRN: [id number] DOB: 08-03-1940 Today's Date: 06/19/2018    History of present illness Patient is a 78 y/o female who presents with HA, weakness and vomiting. Head CT- IVH right lateral ventricle, 3rd and 4th ventricles. PMH includes CVA, HTN, Hepatitis, macular degeneration, anxiety.    OT comments  Patient progressing well.  Able to complete functional transfers and mobility using RW with supervision for safety, able to complete ADL tasks with supervision standing at sink for 8 minutes without rest breaks then completing final task seated for safety (washing feet, applying lotion and changing socks) with min assist- which pt reports is baseline.  No losses of balance noted, fatigues easily and will benefit from further energy conservation training.  Continue to recommend [facility] services, including aide.   Follow Up Recommendations  Home health OT;Supervision - Intermittent;Other (comment)(HH aide)    Equipment Recommendations  None recommended by OT    Recommendations for Other Services      Precautions / Restrictions Precautions Precautions: Fall Precaution Comments: watch HR Restrictions Weight Bearing Restrictions: No       Mobility Bed Mobility Overal bed mobility: Needs Assistance Bed Mobility: Supine to Sit Rolling: Supervision   Supine to sit: Supervision     General bed mobility comments: increased time and effort, supervision for safety   Transfers Overall transfer level: Needs assistance Equipment used: Rolling walker (2 wheeled) Transfers: Sit to/from Stand Sit to Stand: Supervision         General transfer comment: Supervision for safety to stand with RW.     Balance Overall balance assessment: Needs assistance;History of Falls Sitting-balance support: Feet supported;No upper extremity supported Sitting balance-Leahy Scale: Fair Sitting balance - Comments: supervision for safety     Standing balance support: No upper extremity supported;During functional activity Standing balance-Leahy Scale: Fair Standing balance comment: standing at sink with supervision for grooming tasks, 0 hand support                           ADL either performed or assessed with clinical judgement   ADL Overall ADL's : Needs assistance/impaired     Grooming: Supervision/safety;Standing;Wash/dry hands;Wash/dry face;Oral care       Lower Body Bathing: Supervison/ safety;Sit to/from stand Lower Body Bathing Details (indicate cue type and reason): seated to wash feet, increased time and effort      Lower Body Dressing: Minimal assistance;Sit to/from stand Lower Body Dressing Details (indicate cue type and reason): min assist to don L sock, decreased act tolerance; pt reports daugther in law assists as needed at home  Toilet Transfer: Supervision/safety;Ambulation;RW Toilet Transfer Details (indicate cue type and reason): for safety          Functional mobility during ADLs: Supervision/safety;Rolling walker General ADL Comments: pt with decreased activity tolerance     Vision   Vision Assessment?: No apparent visual deficits   Perception     Praxis      Cognition Arousal/Alertness: Awake/alert Behavior During Therapy: WFL for tasks assessed/performed Overall Cognitive Status: History of cognitive impairments - at baseline                                 General Comments: pt reports hx of short term memory loss; requires increased time processing, able to follow multiple step commands without assist; Short blessed test completed scoring 4/28= normal  Exercises     Shoulder Instructions       General Comments HR increased to 126 during ADLs    Pertinent Vitals/ Pain       Pain Assessment: No/denies pain  Home Living                                          Prior Functioning/Environment              Frequency   Min 2X/week        Progress Toward Goals  OT Goals(current goals can now be found in the care plan section)  Progress towards OT goals: Progressing toward goals  Acute Rehab OT Goals Patient Stated Goal: to go home OT Goal Formulation: With patient  Plan Discharge plan remains appropriate;Frequency remains appropriate    Co-evaluation                 AM-PAC OT "6 Clicks" Daily Activity     Outcome Measure   Help from another person eating meals?: None Help from another person taking care of personal grooming?: None Help from another person toileting, which includes using toliet, bedpan, or urinal?: None Help from another person bathing (including washing, rinsing, drying)?: A Little Help from another person to put on and taking off regular upper body clothing?: None Help from another person to put on and taking off regular lower body clothing?: A Little 6 Click Score: 22    End of Session Equipment Utilized During Treatment: Rolling walker  OT Visit Diagnosis: Unsteadiness on feet (R26.81);Muscle weakness (generalized) (M62.81)   Activity Tolerance Patient tolerated treatment well   Patient Left in chair;with call bell/phone within reach;with chair alarm set   Nurse Communication          Time: [id number] OT Time Calculation (min): 32 min  Charges: OT General Charges $OT Visit: 1 Visit OT Treatments $Self Care/Home Management : 23-37 mins  Delight Stare, [place] Pager [phone] Office [place] 06/19/2018, 4:18 PM

## 2018-06-19 NOTE — Progress Notes (Signed)
06/19/18 1706  PT Visit Information  Last PT Received On 06/19/18  Assistance Needed +1  History of Present Illness Patient is a 78 y/o female who presents with HA, weakness and vomiting. Head CT- IVH right lateral ventricle, 3rd and 4th ventricles. PMH includes CVA, HTN, Hepatitis, macular degeneration, anxiety.   Subjective Data  Patient Stated Goal to go home  Precautions  Precautions Fall  Precaution Comments watch HR  Restrictions  Weight Bearing Restrictions No  Pain Assessment  Pain Assessment No/denies pain  Cognition  Arousal/Alertness Awake/alert  Behavior During Therapy WFL for tasks assessed/performed  Overall Cognitive Status History of cognitive impairments - at baseline  General Comments pt reports hx of short term memory loss; requires increased time processing, able to follow multiple step commands without assist.  Bed Mobility  Overal bed mobility Needs Assistance  Bed Mobility Supine to Sit;Sit to Supine  Supine to sit Supervision  Sit to supine Supervision  General bed mobility comments increased time and effort, supervision for safety. Pt reporting some nausea and wooziness in sitting.   Transfers  Overall transfer level Needs assistance  Equipment used Rolling walker (2 wheeled)  Transfers Sit to/from Stand  Sit to Stand Supervision  General transfer comment Supervision for safety to stand with RW.   Ambulation/Gait  Ambulation/Gait assistance Min guard;Supervision  Gait Distance (Feet) 120 Feet  Assistive device Rolling walker (2 wheeled)  Gait Pattern/deviations Step-through pattern;Decreased stride length  General Gait Details Pt with much improved steadiness and did not require physical assist. No LOB noted this session. REquired min guard to supervision for safety. Cues for proximity to RW. Distance limited secondary to nausea.   Gait velocity decreased  Balance  Overall balance assessment Needs assistance;History of Falls  Sitting-balance support  Feet supported;No upper extremity supported  Sitting balance-Leahy Scale Fair  Standing balance support During functional activity;Bilateral upper extremity supported  Standing balance-Leahy Scale Poor  Standing balance comment BUE support   PT - End of Session  Equipment Utilized During Treatment Gait belt  Activity Tolerance Treatment limited secondary to medical complications (Comment) (nausea)  Patient left in bed;with call bell/phone within reach;with bed alarm set  Nurse Communication Mobility status;Other (comment) (pt with nausea)   PT - Assessment/Plan  PT Plan Current plan remains appropriate  PT Visit Diagnosis Unsteadiness on feet (R26.81);Difficulty in walking, not elsewhere classified (R26.2)  PT Frequency (ACUTE ONLY) Min 4X/week  Follow Up Recommendations Home health PT;Supervision for mobility/OOB  PT equipment Rolling walker with 5" wheels  AM-PAC PT "6 Clicks" Mobility Outcome Measure (Version 2)  Help needed turning from your back to your side while in a flat bed without using bedrails? 4  Help needed moving from lying on your back to sitting on the side of a flat bed without using bedrails? 4  Help needed moving to and from a bed to a chair (including a wheelchair)? 3  Help needed standing up from a chair using your arms (e.g., wheelchair or bedside chair)? 3  Help needed to walk in hospital room? 3  Help needed climbing 3-5 steps with a railing?  3  6 Click Score 20  Consider Recommendation of Discharge To: Home with no services  PT Goal Progression  Progress towards PT goals Progressing toward goals  Acute Rehab PT Goals  PT Goal Formulation With patient  Time For Goal Achievement 06/30/18  Potential to Achieve Goals Good  PT Time Calculation  PT Start Time (ACUTE ONLY) 1333  PT Stop Time (  ACUTE ONLY) 1345  PT Time Calculation (min) (ACUTE ONLY) 12 min  PT General Charges  $$ ACUTE PT VISIT 1 Visit  PT Treatments  $Gait Training 8-22 mins   Pt  progressing towards goals, however, was limited by nausea this session. Noted improved steadiness during gait tasks and pt with no LOB this session. Requiring min guard to supervision for safety. Feel pt would benefit from HHPT at d/c. Will continue to follow acutely to maximize functional mobility independence and safety.   Leighton Ruff, PT, DPT  Acute Rehabilitation Services  Pager: [phone] Office: [phone]

## 2018-06-19 NOTE — Progress Notes (Signed)
STROKE TEAM PROGRESS NOTE   SUBJECTIVE (INTERVAL HISTORY) Her RN is at the bedside.  Overall her condition is improved.  No headache. Neuro intact.  Patient was seen by therapy and felt to have minimum needs and hence she will not qualify for skilled nursing facility and will have to pay out-of-pocket.  I spoke to patient's daughter-in-law over the phone and family cannot afford that and they are okay with discharging patient home however echocardiogram results came this evening which suggest EF of 35%.  Review of patient medical records reveal no previous echo to compare to.  Patient apparently has no known diagnosis of CHF and is not on any cardiac medications for that ends we will keep the patient tonight and get cardiology consult.  OBJECTIVE Temp:  [97.8 F (36.6 C)-98.1 F (36.7 C)] 98 F (36.7 C) (04/27 1634) Pulse Rate:  [98-102] 100 (04/27 1634) Cardiac Rhythm: Sinus tachycardia (04/27 0749) Resp:  [16-17] 17 (04/27 1634) BP: (135-155)/(60-85) 139/85 (04/27 1634) SpO2:  [96 %-99 %] 99 % (04/27 1634)  Recent Labs  Lab 06/18/18 0640 06/18/18 1629 06/18/18 2111 06/19/18 0625 06/19/18 1223  GLUCAP 95 120* 94 120* 109*   Recent Labs  Lab 06/16/18 0003 06/17/18 0339 06/18/18 0413 06/19/18 0351  NA 134* 140 135 138  K 4.1 3.3* 4.7 4.1  CL 95* 106 105 105  CO2 23 25 21* 24  GLUCOSE 218* 117* 107* 108*  BUN 9 7* 10 10  CREATININE 0.82 0.77 0.91 1.04*  CALCIUM 9.3 8.4* 8.1* 8.5*   Recent Labs  Lab 06/16/18 0003  AST 26  ALT 16  ALKPHOS 67  BILITOT 0.7  PROT 7.4  ALBUMIN 3.1*   Recent Labs  Lab 06/16/18 0003 06/17/18 0339 06/18/18 0413 06/19/18 0351  WBC 11.5* 6.1 8.0 8.1  NEUTROABS 10.4*  --   --   --   HGB 13.1 10.5* 10.9* 10.7*  HCT 40.0 31.7* 32.7* 32.7*  MCV 86.0 85.4 85.8 86.3  PLT 280 273 256 242   No results for input(s): CKTOTAL, CKMB, CKMBINDEX, TROPONINI in the last 168 hours. No results for input(s): LABPROT, INR in the last 72 hours. No  results for input(s): COLORURINE, LABSPEC, [place], GLUCOSEU, HGBUR, BILIRUBINUR, KETONESUR, PROTEINUR, UROBILINOGEN, NITRITE, LEUKOCYTESUR in the last 72 hours.  Invalid input(s): APPERANCEUR     Component Value Date/Time   CHOL 106 06/16/2018 0003   TRIG 69 06/16/2018 0003   HDL 35 (L) 06/16/2018 0003   CHOLHDL 3.0 06/16/2018 0003   VLDL 14 06/16/2018 0003   LDLCALC 57 06/16/2018 0003   Lab Results  Component Value Date   HGBA1C 8.2 (H) 06/16/2018   No results found for: LABOPIA, [place], [place], [employer], [facility], [facility]  Recent Labs  Lab 06/16/18 0003  ETH <10    IMAGING  I have personally reviewed the radiological images below and agree with the radiology interpretations.  Ct Head Wo Contrast 06/16/2018 IMPRESSION:  1. Unchanged intraventricular hemorrhage.  2. Unchanged size of the ventricles.  3. Chronic right MCA infarct.    Ct Head Wo Contrast 06/16/2018 IMPRESSION:  Isolated intraventricular hemorrhage, most pronounced in the right lateral ventricle. No visible intraparenchymal hemorrhage. Old right posterior MCA infarct, stable. Atrophy, chronic small vessel disease.    Mr Jodene Nam Head Wo Contrast 06/16/2018 IMPRESSION:  1. Unchanged distribution of intraventricular hemorrhage, predominantly within the right lateral ventricle. No causative lesion identified.  2. No intracranial aneurysm, vascular malformation or occlusion.  3. 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  Dg Chest Portable 1 View 06/16/2018 IMPRESSION:  No active disease.     PHYSICAL EXAM  Temp:  [97.8 F (36.6 C)-98.1 F (36.7 C)] 98 F (36.7 C) (04/27 1634) Pulse Rate:  [98-102] 100 (04/27 1634) Resp:  [16-17] 17 (04/27 1634) BP: (135-155)/(60-85) 139/85 (04/27 1634) SpO2:  [96 %-99 %] 99 % (04/27 1634)  General - Well nourished, well developed, in no apparent distress.  Ophthalmologic - fundi not visualized due to noncooperation.  Cardiovascular - Regular rate and  rhythm.  Mental Status -  Level of arousal and orientation to time, place, and person were intact. Language including expression, naming, repetition, comprehension was assessed and found intact. Fund of Knowledge was assessed and was intact.  Cranial Nerves II - XII - II - Visual field intact OU. III, IV, VI - Extraocular movements intact. V - Facial sensation intact bilaterally. VII - chronic left facial droop. VIII - Hearing & vestibular intact bilaterally. X - Palate elevates symmetrically. XI - Chin turning & shoulder shrug intact bilaterally. XII - Tongue protrusion intact.  Motor Strength - [name] strength was normal in all extremities and pronator drift was absent.  Bulk was normal and fasciculations were absent.   Motor Tone - Muscle tone was assessed at the neck and appendages and was normal.  Reflexes - [name] reflexes were symmetrical in all extremities and she had no pathological reflexes.  Sensory - Light touch, temperature/pinprick were assessed and were symmetrical.    Coordination - The patient had normal movements in the hands with no ataxia or dysmetria.  Tremor was absent.  Gait and Station - deferred.   ASSESSMENT/PLAN Ms. GRACEN RINGWALD is a 78 y.o. female with history of hypertension, cataract on the left, macular degeneration on the right eye, right parietal stroke, right ICA stenosis s/p CEA admitted for headache. No tPA given due to IVH.    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 As per Dr. Tobey Grim note, she had right brain stroke and a 70% stenosis of the right ICA, 50% of ICA.  Had right CEA in 01/2016  Follow with vascular surgery Dr. Scot Dock  Last carotid Doppler in 07/2016 showed patent right CEA, left ICA 40 to 59% stenosis  Hidradenitis Suppurativa  Bilateral groins  On Abx - continue with levaquin and flagyl  Continue zinc cream  Wound consult recs appreciated  Hypertension . Stable . Off cleviprex . Resume home meds -losartan . Labetalol PRN  Long term BP goal normotensive  DM - new diagnosis  A1C 8.2, goal < 7.0  Hyperglycemia  SSI  CBG monitoring  Put on metformin 500mg  bid   Need PCP follow up and start medication  Tobacco abuse  Current smoker  Smoking cessation counseling provided  Pt is willing to quit  Other Stroke Risk Factors  Advanced age  Other  Active Problems  Mild leukocytosis, WBC 11.5->6.1->8.0  Mild hyponatremia, Na 134->140->135  Hypokalemia - 3.3 - supplement and recheck -> 4.7  Anemia - 13.1->10.5 ->10.9  PLAN Cardiology consult for low EF of 35%.  Likely discharge home tomorrow.  Spoke to patient's family about plan and answered questions.  Hospital day # 3 I have personally obtained history,examined this patient, reviewed notes, independently viewed imaging studies, participated in medical decision making and plan of care.ROS completed by me personally and pertinent positives fully documented  I have made any additions or clarifications directly to the above note.  Greater than 50% time during this 25-minute visit were spent on counseling and coordination of care about her intracerebral hemorrhage as well as abnormal echo findings and answering questions  Antony Contras, MD Medical Director [facility] Pager:  [phone] 06/19/2018 4:52 PM    A total of 15 minutes was spent for the care of this patient. To contact Stroke Continuity provider, please refer to [email or web address] After hours, contact General Neurology

## 2018-06-20 ENCOUNTER — Encounter (HOSPITAL_COMMUNITY): Payer: Self-pay | Admitting: Physician Assistant

## 2018-06-20 DIAGNOSIS — I429 Cardiomyopathy, unspecified: Secondary | ICD-10-CM

## 2018-06-20 LAB — GLUCOSE, CAPILLARY: Glucose-Capillary: 102 mg/dL — ABNORMAL HIGH (ref 70–99)

## 2018-06-20 MED ORDER — CARVEDILOL 3.125 MG PO TABS
3.1250 mg | ORAL_TABLET | Freq: Two times a day (BID) | ORAL | Status: DC
  Administered 2018-06-20: 3.125 mg via ORAL
  Filled 2018-06-20: qty 1

## 2018-06-20 MED ORDER — NICOTINE 14 MG/24HR TD PT24: 14.0000 mg | MEDICATED_PATCH | Freq: Every day | TRANSDERMAL | 0 refills | Status: AC

## 2018-06-20 MED ORDER — METFORMIN HCL 500 MG PO TABS: 500.0000 mg | ORAL_TABLET | Freq: Two times a day (BID) | ORAL | 2 refills | Status: DC

## 2018-06-20 MED ORDER — CARVEDILOL 3.125 MG PO TABS: 3.1250 mg | ORAL_TABLET | Freq: Two times a day (BID) | ORAL | 2 refills | Status: DC

## 2018-06-20 NOTE — Care Management Important Message (Signed)
Important Message  Patient Details  Name: Melinda Eaton MRN: [id number] Date of Birth: Mar 11, 1940   Medicare Important Message Given:  Yes    Fumi Guadron 06/20/2018, 2:30 PM
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
## 2018-06-20 NOTE — Progress Notes (Signed)
Spoke with daughter, she stated she will pick the patient up around 530 or 6 pm. Discharge instructions given to patient, awaiting ride now

## 2018-06-20 NOTE — Progress Notes (Signed)
IV and tele removed. Pt discharged from unit per staff, daughter to transport home. Pt given discharge instructions and teaching, verbalized understanding and able to teach back. No new questions or concerns

## 2018-06-20 NOTE — Progress Notes (Signed)
Occupational Therapy Treatment Patient Details Name: Melinda Eaton MRN: [id number] DOB: 06-23-1940 Today's Date: 06/20/2018    History of present illness Patient is a 78 y/o female who presents with HA, weakness and vomiting. Head CT- IVH right lateral ventricle, 3rd and 4th ventricles. PMH includes CVA, HTN, Hepatitis, macular degeneration, anxiety.    OT comments  Patient progressing well.  Continues to requires supervision for safety with functional mobility and transfers in room using RW, modified independence demonstrated with toileting.  Reviewed and provided handout for energy conservation techniques and safety at home, continues to be limited by decreased activity tolerance.  Will follow    Follow Up Recommendations  Home health OT;Supervision - Intermittent;Other (comment)(aide)    Equipment Recommendations  None recommended by OT    Recommendations for Other Services      Precautions / Restrictions Precautions Precautions: Fall Precaution Comments: watch HR Restrictions Weight Bearing Restrictions: No       Mobility Bed Mobility Overal bed mobility: Needs Assistance Bed Mobility: Sit to Supine       Sit to supine: Supervision   General bed mobility comments: increased time and effort, no assist required  Transfers Overall transfer level: Needs assistance Equipment used: Rolling walker (2 wheeled) Transfers: Sit to/from Stand Sit to Stand: Supervision         General transfer comment: Supervision for safety to stand with RW.     Balance Overall balance assessment: Needs assistance;History of Falls Sitting-balance support: Feet supported;No upper extremity supported Sitting balance-Leahy Scale: Fair     Standing balance support: During functional activity;Bilateral upper extremity supported Standing balance-Leahy Scale: Poor Standing balance comment: reliant on at least 1 UE support                           ADL either performed or  assessed with clinical judgement   ADL Overall ADL's : Needs assistance/impaired     Grooming: Wash/dry hands;Supervision/safety;Standing                   Toilet Transfer: Supervision/safety;Ambulation;RW Toilet Transfer Details (indicate cue type and reason): for safety  Toileting- Clothing Manipulation and Hygiene: Modified independent;Sit to/from stand       Functional mobility during ADLs: Supervision/safety;Rolling walker General ADL Comments: pt with decreased activity tolerance     Vision       Perception     Praxis      Cognition Arousal/Alertness: Awake/alert Behavior During Therapy: WFL for tasks assessed/performed Overall Cognitive Status: History of cognitive impairments - at baseline                                 General Comments: pt reports hx of short term memory loss; requires increased time processing, able to follow multiple step commands without assist.        Exercises     Shoulder Instructions       General Comments HR increased to 120 during ADls, discussed energy conservation techniques and provided handout      Pertinent Vitals/ Pain       Pain Assessment: No/denies pain  Home Living                                          Prior Functioning/Environment  Frequency  Min 2X/week        Progress Toward Goals  OT Goals(current goals can now be found in the care plan section)  Progress towards OT goals: Progressing toward goals  Acute Rehab OT Goals Patient Stated Goal: to go home OT Goal Formulation: With patient Time For Goal Achievement: 07/01/18 Potential to Achieve Goals: Good  Plan Discharge plan remains appropriate;Frequency remains appropriate    Co-evaluation                 AM-PAC OT "6 Clicks" Daily Activity     Outcome Measure   Help from another person eating meals?: None Help from another person taking care of personal grooming?: None Help  from another person toileting, which includes using toliet, bedpan, or urinal?: None Help from another person bathing (including washing, rinsing, drying)?: A Little Help from another person to put on and taking off regular upper body clothing?: None Help from another person to put on and taking off regular lower body clothing?: A Little 6 Click Score: 22    End of Session Equipment Utilized During Treatment: Rolling walker  OT Visit Diagnosis: Unsteadiness on feet (R26.81);Muscle weakness (generalized) (M62.81)   Activity Tolerance Patient tolerated treatment well   Patient Left with call bell/phone within reach;in bed;with bed alarm set   Nurse Communication Mobility status        Time: 6815-9470 OT Time Calculation (min): 16 min  Charges: OT General Charges $OT Visit: 1 Visit OT Treatments $Self Care/Home Management : 8-22 mins  Delight Stare, [place] Pager [phone] Office [phone]    Delight Stare 06/20/2018, 3:55 PM

## 2018-06-20 NOTE — Plan of Care (Signed)
  Problem: Education: Goal: Knowledge of secondary prevention will improve Outcome: Progressing Goal: Knowledge of patient specific risk factors addressed and post discharge goals established will improve Outcome: Progressing Goal: Individualized Educational Video(s) Outcome: Progressing   Problem: Health Behavior/Discharge Planning: Goal: Ability to manage health-related needs will improve Outcome: Progressing   Problem: Self-Care: Goal: Verbalization of feelings and concerns over difficulty with self-care will improve Outcome: Progressing Goal: Ability to communicate needs accurately will improve Outcome: Progressing    Will conti to montitor.

## 2018-06-20 NOTE — Progress Notes (Signed)
   Called into the room to discuss tele-health visit in 2 weeks.   Pt stated she could weigh herself and could take her BP.  Not able to do video visit, could to telephone visit.  Pt refuses the phone call, says we can call her dtr-in-law, Mertha Baars and Ms Stann Mainland will have all the information.  Called Ms Stann Mainland. She is willing to help, discussed the situation w/ her. She requests late afternoon appt, can do if she is not working. Advised her that we would need BP, weight info.  Made phone appt w/ Richardson Dopp, [place] for 05/12 at 3:45 pm.  Rosaria Ferries, PA-C 06/20/2018 3:09 PM Beeper [phone]

## 2018-06-22 ENCOUNTER — Emergency Department (HOSPITAL_COMMUNITY): Payer: Medicare PPO

## 2018-06-22 ENCOUNTER — Other Ambulatory Visit: Payer: Self-pay

## 2018-06-22 ENCOUNTER — Emergency Department (HOSPITAL_COMMUNITY)
Admission: EM | Admit: 2018-06-22 | Discharge: 2018-06-23 | Disposition: A | Discharge status: Home / Self Care | Payer: Medicare PPO | Attending: Emergency Medicine | Admitting: Emergency Medicine

## 2018-06-22 ENCOUNTER — Encounter (HOSPITAL_COMMUNITY): Payer: Self-pay

## 2018-06-22 DIAGNOSIS — Z7984 Long term (current) use of oral hypoglycemic drugs: Secondary | ICD-10-CM | POA: Diagnosis not present

## 2018-06-22 DIAGNOSIS — F17210 Nicotine dependence, cigarettes, uncomplicated: Secondary | ICD-10-CM | POA: Insufficient documentation

## 2018-06-22 DIAGNOSIS — R51 Headache: Secondary | ICD-10-CM | POA: Insufficient documentation

## 2018-06-22 DIAGNOSIS — F419 Anxiety disorder, unspecified: Secondary | ICD-10-CM | POA: Diagnosis not present

## 2018-06-22 DIAGNOSIS — Z20828 Contact with and (suspected) exposure to other viral communicable diseases: Secondary | ICD-10-CM | POA: Insufficient documentation

## 2018-06-22 DIAGNOSIS — I10 Essential (primary) hypertension: Secondary | ICD-10-CM | POA: Insufficient documentation

## 2018-06-22 DIAGNOSIS — Z8673 Personal history of transient ischemic attack (TIA), and cerebral infarction without residual deficits: Secondary | ICD-10-CM | POA: Insufficient documentation

## 2018-06-22 DIAGNOSIS — E119 Type 2 diabetes mellitus without complications: Secondary | ICD-10-CM | POA: Diagnosis not present

## 2018-06-22 DIAGNOSIS — R110 Nausea: Secondary | ICD-10-CM | POA: Insufficient documentation

## 2018-06-22 DIAGNOSIS — Z79899 Other long term (current) drug therapy: Secondary | ICD-10-CM | POA: Insufficient documentation

## 2018-06-22 DIAGNOSIS — R519 Headache, unspecified: Secondary | ICD-10-CM

## 2018-06-22 LAB — COMPREHENSIVE METABOLIC PANEL
ALT: 8 U/L (ref 0–44)
AST: 31 U/L (ref 15–41)
Albumin: 2.6 g/dL — ABNORMAL LOW (ref 3.5–5.0)
Alkaline Phosphatase: 45 U/L (ref 38–126)
Anion gap: 10 (ref 5–15)
BUN: 9 mg/dL (ref 8–23)
CO2: 24 mmol/L (ref 22–32)
Calcium: 8.4 mg/dL — ABNORMAL LOW (ref 8.9–10.3)
Chloride: 98 mmol/L (ref 98–111)
Creatinine, Ser: 0.83 mg/dL (ref 0.44–1.00)
GFR calc Af Amer: >60 mL/min (ref >60)
GFR calc non Af Amer: >60 mL/min (ref >60)
Glucose, Bld: 125 mg/dL — ABNORMAL HIGH (ref 70–99)
Potassium: 4.3 mmol/L (ref 3.5–5.1)
Sodium: 132 mmol/L — ABNORMAL LOW (ref 135–145)
Total Bilirubin: 1.3 mg/dL — ABNORMAL HIGH (ref 0.3–1.2)
Total Protein: 5.6 g/dL — ABNORMAL LOW (ref 6.5–8.1)

## 2018-06-22 LAB — CBC WITH DIFFERENTIAL/PLATELET
Abs Immature Granulocytes: 0.03 10*3/uL (ref 0.00–0.07)
Basophils Absolute: 0 10*3/uL (ref 0.0–0.1)
Basophils Relative: 0 %
Eosinophils Absolute: 0.1 10*3/uL (ref 0.0–0.5)
Eosinophils Relative: 1 %
HCT: 35.3 % — ABNORMAL LOW (ref 36.0–46.0)
Hemoglobin: 11.4 g/dL — ABNORMAL LOW (ref 12.0–15.0)
Immature Granulocytes: 0 %
Lymphocytes Relative: 10 %
Lymphs Abs: 1 10*3/uL (ref 0.7–4.0)
MCH: 28.3 pg (ref 26.0–34.0)
MCHC: 32.3 g/dL (ref 30.0–36.0)
MCV: 87.6 fL (ref 80.0–100.0)
Monocytes Absolute: 0.7 10*3/uL (ref 0.1–1.0)
Monocytes Relative: 8 %
Neutro Abs: 7.8 10*3/uL — ABNORMAL HIGH (ref 1.7–7.7)
Neutrophils Relative %: 81 %
Platelets: 244 10*3/uL (ref 150–400)
RBC: 4.03 MIL/uL (ref 3.87–5.11)
RDW: 13.8 % (ref 11.5–15.5)
WBC: 9.6 10*3/uL (ref 4.0–10.5)
nRBC: 0 % (ref 0.0–0.2)

## 2018-06-22 MED ORDER — MORPHINE SULFATE (PF) 2 MG/ML IV SOLN
2.0000 mg | Freq: Once | INTRAVENOUS | Status: AC
  Administered 2018-06-22: 15:00:00 2 mg via INTRAVENOUS
  Filled 2018-06-22: qty 1

## 2018-06-22 MED ORDER — POLYETHYLENE GLYCOL 3350 17 G PO PACK
17.0000 g | PACK | Freq: Every day | ORAL | Status: DC
  Administered 2018-06-23: 17 g via ORAL
  Filled 2018-06-22: qty 1

## 2018-06-22 MED ORDER — CARVEDILOL 3.125 MG PO TABS
3.1250 mg | ORAL_TABLET | Freq: Two times a day (BID) | ORAL | Status: DC
  Administered 2018-06-22 – 2018-06-23 (×2): 3.125 mg via ORAL
  Filled 2018-06-22 (×3): qty 1

## 2018-06-22 MED ORDER — FINASTERIDE 5 MG PO TABS
5.0000 mg | ORAL_TABLET | Freq: Every day | ORAL | Status: DC
  Administered 2018-06-23: 10:00:00 5 mg via ORAL
  Filled 2018-06-22: qty 1

## 2018-06-22 MED ORDER — ZINC OXIDE 11.3 % EX CREA
TOPICAL_CREAM | CUTANEOUS | Status: DC | PRN
  Filled 2018-06-22: qty 56

## 2018-06-22 MED ORDER — LOSARTAN POTASSIUM 50 MG PO TABS
50.0000 mg | ORAL_TABLET | Freq: Two times a day (BID) | ORAL | Status: DC
  Administered 2018-06-22 – 2018-06-23 (×2): 50 mg via ORAL
  Filled 2018-06-22 (×2): qty 1

## 2018-06-22 MED ORDER — ATORVASTATIN CALCIUM 40 MG PO TABS
40.0000 mg | ORAL_TABLET | Freq: Every day | ORAL | Status: DC
  Administered 2018-06-23: 40 mg via ORAL
  Filled 2018-06-22: qty 1

## 2018-06-22 MED ORDER — ONDANSETRON HCL 4 MG/2ML IJ SOLN
4.0000 mg | Freq: Once | INTRAMUSCULAR | Status: AC
  Administered 2018-06-22: 4 mg via INTRAVENOUS
  Filled 2018-06-22: qty 2

## 2018-06-22 MED ORDER — METFORMIN HCL 500 MG PO TABS
500.0000 mg | ORAL_TABLET | Freq: Two times a day (BID) | ORAL | Status: DC
  Filled 2018-06-22 (×2): qty 1

## 2018-06-22 MED ORDER — ACETAMINOPHEN 500 MG PO TABS: 500.0000 mg | ORAL_TABLET | Freq: Four times a day (QID) | ORAL | 0 refills | Status: AC | PRN

## 2018-06-22 MED ORDER — METRONIDAZOLE 500 MG PO TABS
500.0000 mg | ORAL_TABLET | Freq: Three times a day (TID) | ORAL | Status: DC
  Administered 2018-06-22 – 2018-06-23 (×2): 500 mg via ORAL
  Filled 2018-06-22 (×2): qty 1

## 2018-06-22 MED ORDER — ONDANSETRON 4 MG PO TBDP: 4.0000 mg | ORAL_TABLET | Freq: Three times a day (TID) | ORAL | 0 refills | Status: AC | PRN

## 2018-06-22 MED ORDER — LEVOFLOXACIN 750 MG PO TABS
750.0000 mg | ORAL_TABLET | Freq: Every day | ORAL | Status: DC
  Administered 2018-06-23: 10:00:00 750 mg via ORAL
  Filled 2018-06-22: qty 1

## 2018-06-22 MED ORDER — PROSIGHT PO TABS
2.0000 | ORAL_TABLET | Freq: Every day | ORAL | Status: DC
  Filled 2018-06-22 (×2): qty 2

## 2018-06-22 MED ORDER — NICOTINE 14 MG/24HR TD PT24
14.0000 mg | MEDICATED_PATCH | Freq: Every day | TRANSDERMAL | Status: DC
  Administered 2018-06-23: 10:00:00 14 mg via TRANSDERMAL
  Filled 2018-06-22: qty 1

## 2018-06-22 NOTE — ED Notes (Signed)
Pt ambulatory to bathroom with no reported issues. 

## 2018-06-22 NOTE — Progress Notes (Addendum)
10:10PM: CSW spoke with PA and RNCM regarding patient's disposition. CSW discussed the concerns family reported and what they reported [place] told them. CSW followed up and spoke with family and noted they did not feel it would be safe for patient to return tonight and pressed for a PT follow-up. CSW informed them PT may not change their recommendation and discussed SNF facilities change in procedure due to external infection control concerns.   CSW spoke with patient regarding her last two days home. Per patient report CSW noted patient reported doing okay at home and struggling with using her walker some. CSW noted patient was open to SNF as an option if recommended with no specific geographic preference. CSW reviewed the process and informed her it would need to be recommended by physical therapy. CSW received patient's consent to contact her family who she reports she has been staying with.  CSW spoke with patient's family and discussed patient's last two days home. Per her daughter-in-law who was with her both days patient was unable to put on any of her clothing, unable to toilet independently, unable to prepare food to herself, and would be able to ambulate with her walker from her bed to the couch and would sit there all day.  CSW will continue with assessment and will follow-up as appropriate for placement needs and supports. CSW notes patient family expressed concern of patient returning home due to concerns of her being weak and them not being available to stay with her.  Lamonte Richer, LCSW, [place] [facility] [phone]

## 2018-06-22 NOTE — ED Triage Notes (Signed)
Pt admitted for stroke last week, headache since discharge that became unbarable, advised by PCP to come today. [place] and some dizziness with EMS No fever, or new cough (everyday smoker)

## 2018-06-22 NOTE — ED Notes (Signed)
This RN acting as Art therapist and asked pt if she would like me to contact any family for her. Pt requested that her daughter Dorian Pod be called. I spoke with Dorian Pod and informed her about pt's care thus far and stated we will be in contact as we gain more information.

## 2018-06-22 NOTE — Discharge Instructions (Addendum)
You were seen in the emergency department today for continued headache after discharge from the hospital.  Your CT scan shows that your hemorrhage (bleed) is improving.  Your labs appear similar to prior blood work you have had done.  We discussed with the neurology team, they have recommended we send you home with prescriptions for Tylenol and Zofran.  Take Tylenol every 6 hours as needed for pain.  Please take Zofran every 8 hours as needed for nausea or vomiting.  We have prescribed you new medication(s) today. Discuss the medications prescribed today with your pharmacist as they can have adverse effects and interactions with your other medicines including over the counter and prescribed medications. Seek medical evaluation if you start to experience new or abnormal symptoms after taking one of these medicines, seek care immediately if you start to experience difficulty breathing, feeling of your throat closing, facial swelling, or rash as these could be indications of a more serious allergic reaction  We would like you to follow-up closely with [facility] neurology, please call tomorrow or to make an appointment within the next few days.  Return to the emergency department immediately should you experience new or worsening symptoms including but not limited to worsening headache, change in quality of headache, change in vision, numbness, weakness, dizziness, passing out, chest pain, trouble breathing, or any other concerns that you may have.  Results for orders placed or performed during the hospital encounter of 06/22/18  CBC with Differential  Result Value Ref Range   WBC 9.6 4.0 - 10.5 K/uL   RBC 4.03 3.87 - 5.11 MIL/uL   Hemoglobin 11.4 (L) 12.0 - 15.0 g/dL   HCT 35.3 (L) 36.0 - 46.0 %   MCV 87.6 80.0 - 100.0 fL   MCH 28.3 26.0 - 34.0 pg   MCHC 32.3 30.0 - 36.0 g/dL   RDW 13.8 11.5 - 15.5 %   Platelets 244 150 - 400 K/uL   nRBC 0.0 0.0 - 0.2 %   Neutrophils Relative % 81 %   Neutro Abs 7.8  (H) 1.7 - 7.7 K/uL   Lymphocytes Relative 10 %   Lymphs Abs 1.0 0.7 - 4.0 K/uL   Monocytes Relative 8 %   Monocytes Absolute 0.7 0.1 - 1.0 K/uL   Eosinophils Relative 1 %   Eosinophils Absolute 0.1 0.0 - 0.5 K/uL   Basophils Relative 0 %   Basophils Absolute 0.0 0.0 - 0.1 K/uL   Immature Granulocytes 0 %   Abs Immature Granulocytes 0.03 0.00 - 0.07 K/uL  Comprehensive metabolic panel  Result Value Ref Range   Sodium 132 (L) 135 - 145 mmol/L   Potassium 4.3 3.5 - 5.1 mmol/L   Chloride 98 98 - 111 mmol/L   CO2 24 22 - 32 mmol/L   Glucose, Bld 125 (H) 70 - 99 mg/dL   BUN 9 8 - 23 mg/dL   Creatinine, Ser 0.83 0.44 - 1.00 mg/dL   Calcium 8.4 (L) 8.9 - 10.3 mg/dL   Total Protein 5.6 (L) 6.5 - 8.1 g/dL   Albumin 2.6 (L) 3.5 - 5.0 g/dL   AST 31 15 - 41 U/L   ALT 8 0 - 44 U/L   Alkaline Phosphatase 45 38 - 126 U/L   Total Bilirubin 1.3 (H) 0.3 - 1.2 mg/dL   GFR calc non Af Amer >60 >60 mL/min   GFR calc Af Amer >60 >60 mL/min   Anion gap 10 5 - 15   Ct Head Wo Contrast  Result Date: 06/22/2018 CLINICAL DATA:  Headache and nausea. Previous intraventricular hemorrhage 06/16/2018. EXAM: CT HEAD WITHOUT CONTRAST TECHNIQUE: Contiguous axial images were obtained from the base of the skull through the vertex without intravenous contrast. COMPARISON:  06/16/2018. FINDINGS: Brain: Background pattern of generalized atrophy, chronic small-vessel ischemic changes of the white matter, old infarction in the right parieto-occipital brain and right parietal brain appear the same. No sign of acute infarction, mass lesion or extra-axial collection. Previously seen intraventricular hemorrhage is evidence only by a small amount of residual hyperdense blood in the occipital horns of the lateral ventricles. Ventricular size is normal without hydrocephalus. Vascular: There is atherosclerotic calcification of the major vessels at the base of the brain. Skull: Negative Sinuses/Orbits: Clear/normal Other: None  IMPRESSION: Expected/resolving evolutionary findings related to previous intraventricular hemorrhage. There is only a small amount residual hyperdense blood products visible dependent in the occipital horns of the lateral ventricles. The patient has not developed hydrocephalus. Ventricular size is baseline. Extensive atrophy, chronic small-vessel ischemic changes of the white matter and old cortical and subcortical infarctions in the right hemisphere as noted above. Electronically Signed   By: Nelson Chimes M.D.   On: 06/22/2018 17:18   Ct Head Wo Contrast  Result Date: 06/16/2018 CLINICAL DATA:  Follow-up intracranial hemorrhage. EXAM: CT HEAD WITHOUT CONTRAST TECHNIQUE: Contiguous axial images were obtained from the base of the skull through the vertex without intravenous contrast. COMPARISON:  Head CT and MRI 06/16/2018 FINDINGS: Brain: Intraventricular hemorrhage in the right greater than left lateral ventricles, third ventricle, and fourth ventricle demonstrates minimal interval redistribution but has not significantly changed in overall volume. The ventricles are unchanged in size with mild dilatation of the temporal horns compared to a 12/16/2017 CT. A moderate-sized chronic posterior right MCA infarct is again noted. No acute infarct, midline shift, or extra-axial fluid collection is identified. Patchy hypodensities in the cerebral white matter bilaterally are nonspecific but compatible with moderate chronic small vessel ischemic disease. Vascular: Calcified atherosclerosis at the skull base. No hyperdense vessel. Skull: No fracture or suspicious osseous lesion. Sinuses/Orbits: Paranasal sinuses and mastoid air cells are clear. Unremarkable orbits. Other: None. IMPRESSION: 1. Unchanged intraventricular hemorrhage. 2. Unchanged size of the ventricles. 3. Chronic right MCA infarct. Electronically Signed   By: Logan Bores M.D.   On: 06/16/2018 12:49   Ct Head Wo Contrast  Result Date:  06/16/2018 CLINICAL DATA:  Headache, weakness, nausea, vomiting EXAM: CT HEAD WITHOUT CONTRAST TECHNIQUE: Contiguous axial images were obtained from the base of the skull through the vertex without intravenous contrast. COMPARISON:  12/16/2017 FINDINGS: Brain: There is intraventricular blood noted most pronounced in the right lateral ventricle, but also noted in the left lateral ventricle, 3rd and 4th ventricles. No visible intraparenchymal hemorrhage. Old right posterior temporal and parietal infarcts, stable since prior study. There is atrophy and chronic small vessel disease changes. Vascular: No hyperdense vessel or unexpected calcification. Skull: No acute calvarial abnormality. Sinuses/Orbits: Visualized paranasal sinuses and mastoids clear. Orbital soft tissues unremarkable. Other: None IMPRESSION: Isolated intraventricular hemorrhage, most pronounced in the right lateral ventricle. No visible intraparenchymal hemorrhage. Old right posterior MCA infarct, stable. Atrophy, chronic small vessel disease. Critical Value/emergent results were called by telephone at the time of interpretation on 06/16/2018 at 1:00 am to Dr. Merrily Pew , who verbally acknowledged these results. Electronically Signed   By: Rolm Baptise M.D.   On: 06/16/2018 01:02   Mr Jodene Nam Head Wo Contrast  Result Date: 06/16/2018 CLINICAL DATA:  Headache and  weakness for 2 days EXAM: MRI HEAD WITHOUT CONTRAST MRA HEAD WITHOUT CONTRAST TECHNIQUE: Multiplanar, multiecho pulse sequences of the brain and surrounding structures were obtained without intravenous contrast. Angiographic images of the head were obtained using MRA technique without contrast. COMPARISON:  Head CT 06/16/2018 FINDINGS: MRI HEAD FINDINGS BRAIN: There is acute intraventricular hemorrhage, unchanged from the earlier head CT. Distribution of blood is unchanged. The midline structures are normal. There is an old right parietal lobe infarct. The white matter signal is normal for  the patient's age. The cerebral and cerebellar volume are age-appropriate. No hydrocephalus. Susceptibility-sensitive sequences show no chronic microhemorrhage or superficial siderosis. No mass lesion. VASCULAR: The major intracranial arterial and venous sinus flow voids are normal. SKULL AND UPPER CERVICAL SPINE: Calvarial bone marrow signal is normal. There is no skull base mass. Visualized upper cervical spine and soft tissues are normal. SINUSES/ORBITS: No fluid levels or advanced mucosal thickening. No mastoid or middle ear effusion. The orbits are normal. MRA HEAD FINDINGS POSTERIOR CIRCULATION: --Vertebral arteries: Normal codominant configuration of V4 segments. --Posterior inferior cerebellar arteries (PICA): Patent origins from the vertebral arteries. --Anterior inferior cerebellar arteries (AICA): Patent origins from the basilar artery. --Basilar artery: Normal. --Superior cerebellar arteries: Normal. --Posterior cerebral arteries (PCA): Normal. Both are predominantly supplied by the posterior communicating arteries (p-comm). ANTERIOR CIRCULATION: --Intracranial internal carotid arteries: Normal. --Anterior cerebral arteries (ACA): Normal. Both A1 segments are present. Patent anterior communicating artery (a-comm). --Middle cerebral arteries (MCA): Normal. IMPRESSION: 1. Unchanged distribution of intraventricular hemorrhage, predominantly within the right lateral ventricle. No causative lesion identified. 2. No intracranial aneurysm, vascular malformation or occlusion. 3. 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 Electronically Signed   By: Ulyses Jarred M.D.   On: 06/16/2018 03:25   Mr Brain Wo Contrast  Result Date: 06/16/2018 CLINICAL DATA:  Headache and weakness for 2 days EXAM: MRI HEAD WITHOUT CONTRAST MRA HEAD WITHOUT CONTRAST TECHNIQUE: Multiplanar, multiecho pulse sequences of the brain and surrounding structures were obtained without intravenous contrast. Angiographic images of the head were obtained  using MRA technique without contrast. COMPARISON:  Head CT 06/16/2018 FINDINGS: MRI HEAD FINDINGS BRAIN: There is acute intraventricular hemorrhage, unchanged from the earlier head CT. Distribution of blood is unchanged. The midline structures are normal. There is an old right parietal lobe infarct. The white matter signal is normal for the patient's age. The cerebral and cerebellar volume are age-appropriate. No hydrocephalus. Susceptibility-sensitive sequences show no chronic microhemorrhage or superficial siderosis. No mass lesion. VASCULAR: The major intracranial arterial and venous sinus flow voids are normal. SKULL AND UPPER CERVICAL SPINE: Calvarial bone marrow signal is normal. There is no skull base mass. Visualized upper cervical spine and soft tissues are normal. SINUSES/ORBITS: No fluid levels or advanced mucosal thickening. No mastoid or middle ear effusion. The orbits are normal. MRA HEAD FINDINGS POSTERIOR CIRCULATION: --Vertebral arteries: Normal codominant configuration of V4 segments. --Posterior inferior cerebellar arteries (PICA): Patent origins from the vertebral arteries. --Anterior inferior cerebellar arteries (AICA): Patent origins from the basilar artery. --Basilar artery: Normal. --Superior cerebellar arteries: Normal. --Posterior cerebral arteries (PCA): Normal. Both are predominantly supplied by the posterior communicating arteries (p-comm). ANTERIOR CIRCULATION: --Intracranial internal carotid arteries: Normal. --Anterior cerebral arteries (ACA): Normal. Both A1 segments are present. Patent anterior communicating artery (a-comm). --Middle cerebral arteries (MCA): Normal. IMPRESSION: 1. Unchanged distribution of intraventricular hemorrhage, predominantly within the right lateral ventricle. No causative lesion identified. 2. No intracranial aneurysm, vascular malformation or occlusion. 3. 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 Electronically Signed   By:  Ulyses Jarred M.D.   On: 06/16/2018 03:25    Dg Chest Portable 1 View  Result Date: 06/16/2018 CLINICAL DATA:  Cough, tachycardia EXAM: PORTABLE CHEST 1 VIEW COMPARISON:  12/18/2017 FINDINGS: Heart and mediastinal contours are within normal limits. No focal opacities or effusions. No acute bony abnormality. IMPRESSION: No active disease. Electronically Signed   By: Rolm Baptise M.D.   On: 06/16/2018 01:27

## 2018-06-22 NOTE — ED Notes (Signed)
Pt's family called and requested to speak with PA. Pt's family feels she needs to be in rehab and would like her to be placed. This RN spoke with Mariann Laster  With case management who is going be assisting with the process. Pt informed of the new situation.

## 2018-06-22 NOTE — Care Management (Addendum)
ED CM received call from S. Petrucelli PA-C concerning patient and family with concerns about Rehab placement. Patient was discharged from [facility] 4/28. Patient was evaluated and did not meet criteria based on PT evaluation. CM contacted CSW who will review and speak with patient and family. Updated S. Petrucelli PA-C.

## 2018-06-22 NOTE — ED Provider Notes (Addendum)
16:00: Assumed care of patient from Nuala Alpha PA-C @ change of shift pending head CT w/ likely consultation to neurology.   Patient has baseline L sided facial droop from old stroke. She had recent admission 04/23-04/28 for intraventricular hemorrhage most pronounced in the R lateral ventricle. Per review of chart initial ED visit for gradual onset bad headache with subsequent vomiting & generalized weakness. CT/MRI showed intraventricular hemorrhage. Admitted to neuro ICU, discharged 04/28 with plan for PCP follow up in 2 weeks, neurology follow up in 4 weeks. She has had no significant change in her headache or nausea since her initial ED visit 04/24, she has returned to the ER today as she is still very uncomfortable. NO acute change in severity or quality of headache.   Per prior PA other than baseline L sided facial droop no acute neuro deficits noted.  Has given morphine/zofran for sxs.  Plan for basic labs, CT head, w/ likely consultation to neurology.   Physical Exam  BP (!) 142/85   Pulse 99   Temp 98.3 F (36.8 C) (Oral)   Resp 17   Ht 5\' 3"  (1.6 m)   Wt 65.8 kg   SpO2 99%   BMI 25.70 kg/m   Physical Exam Vitals signs and nursing note reviewed.  Constitutional:      General: She is not in acute distress.    Appearance: She is well-developed.  HENT:     Head: Normocephalic and atraumatic.  Eyes:     General:        Right eye: No discharge.        Left eye: No discharge.     Conjunctiva/sclera: Conjunctivae normal.  Neurological:     Mental Status: She is alert.     Comments: Clear speech. L sided facial droop w/ smiling. Sensation grossly intact x 4. 5/5 symmetric grip strength & strength with plantar/dorsiflexion bilaterally. Negative pronator drift.   Psychiatric:        Behavior: Behavior normal.        Thought Content: Thought content normal.     ED Course/Procedures     Procedures Results for orders placed or performed during the hospital encounter of  06/22/18  CBC with Differential  Result Value Ref Range   WBC 9.6 4.0 - 10.5 K/uL   RBC 4.03 3.87 - 5.11 MIL/uL   Hemoglobin 11.4 (L) 12.0 - 15.0 g/dL   HCT 35.3 (L) 36.0 - 46.0 %   MCV 87.6 80.0 - 100.0 fL   MCH 28.3 26.0 - 34.0 pg   MCHC 32.3 30.0 - 36.0 g/dL   RDW 13.8 11.5 - 15.5 %   Platelets 244 150 - 400 K/uL   nRBC 0.0 0.0 - 0.2 %   Neutrophils Relative % 81 %   Neutro Abs 7.8 (H) 1.7 - 7.7 K/uL   Lymphocytes Relative 10 %   Lymphs Abs 1.0 0.7 - 4.0 K/uL   Monocytes Relative 8 %   Monocytes Absolute 0.7 0.1 - 1.0 K/uL   Eosinophils Relative 1 %   Eosinophils Absolute 0.1 0.0 - 0.5 K/uL   Basophils Relative 0 %   Basophils Absolute 0.0 0.0 - 0.1 K/uL   Immature Granulocytes 0 %   Abs Immature Granulocytes 0.03 0.00 - 0.07 K/uL  Comprehensive metabolic panel  Result Value Ref Range   Sodium 132 (L) 135 - 145 mmol/L   Potassium 4.3 3.5 - 5.1 mmol/L   Chloride 98 98 - 111 mmol/L   CO2 24 22 -  32 mmol/L   Glucose, Bld 125 (H) 70 - 99 mg/dL   BUN 9 8 - 23 mg/dL   Creatinine, Ser 0.83 0.44 - 1.00 mg/dL   Calcium 8.4 (L) 8.9 - 10.3 mg/dL   Total Protein 5.6 (L) 6.5 - 8.1 g/dL   Albumin 2.6 (L) 3.5 - 5.0 g/dL   AST 31 15 - 41 U/L   ALT 8 0 - 44 U/L   Alkaline Phosphatase 45 38 - 126 U/L   Total Bilirubin 1.3 (H) 0.3 - 1.2 mg/dL   GFR calc non Af Amer >60 >60 mL/min   GFR calc Af Amer >60 >60 mL/min   Anion gap 10 5 - 15   Ct Head Wo Contrast  Result Date: 06/22/2018 CLINICAL DATA:  Headache and nausea. Previous intraventricular hemorrhage 06/16/2018. EXAM: CT HEAD WITHOUT CONTRAST TECHNIQUE: Contiguous axial images were obtained from the base of the skull through the vertex without intravenous contrast. COMPARISON:  06/16/2018. FINDINGS: Brain: Background pattern of generalized atrophy, chronic small-vessel ischemic changes of the white matter, old infarction in the right parieto-occipital brain and right parietal brain appear the same. No sign of acute infarction, mass  lesion or extra-axial collection. Previously seen intraventricular hemorrhage is evidence only by a small amount of residual hyperdense blood in the occipital horns of the lateral ventricles. Ventricular size is normal without hydrocephalus. Vascular: There is atherosclerotic calcification of the major vessels at the base of the brain. Skull: Negative Sinuses/Orbits: Clear/normal Other: None IMPRESSION: Expected/resolving evolutionary findings related to previous intraventricular hemorrhage. There is only a small amount residual hyperdense blood products visible dependent in the occipital horns of the lateral ventricles. The patient has not developed hydrocephalus. Ventricular size is baseline. Extensive atrophy, chronic small-vessel ischemic changes of the white matter and old cortical and subcortical infarctions in the right hemisphere as noted above. Electronically Signed   By: Nelson Chimes M.D.   On: 06/22/2018 17:18   Ct Head Wo Contrast  Result Date: 06/16/2018 CLINICAL DATA:  Follow-up intracranial hemorrhage. EXAM: CT HEAD WITHOUT CONTRAST TECHNIQUE: Contiguous axial images were obtained from the base of the skull through the vertex without intravenous contrast. COMPARISON:  Head CT and MRI 06/16/2018 FINDINGS: Brain: Intraventricular hemorrhage in the right greater than left lateral ventricles, third ventricle, and fourth ventricle demonstrates minimal interval redistribution but has not significantly changed in overall volume. The ventricles are unchanged in size with mild dilatation of the temporal horns compared to a 12/16/2017 CT. A moderate-sized chronic posterior right MCA infarct is again noted. No acute infarct, midline shift, or extra-axial fluid collection is identified. Patchy hypodensities in the cerebral white matter bilaterally are nonspecific but compatible with moderate chronic small vessel ischemic disease. Vascular: Calcified atherosclerosis at the skull base. No hyperdense vessel.  Skull: No fracture or suspicious osseous lesion. Sinuses/Orbits: Paranasal sinuses and mastoid air cells are clear. Unremarkable orbits. Other: None. IMPRESSION: 1. Unchanged intraventricular hemorrhage. 2. Unchanged size of the ventricles. 3. Chronic right MCA infarct. Electronically Signed   By: Logan Bores M.D.   On: 06/16/2018 12:49   Ct Head Wo Contrast  Result Date: 06/16/2018 CLINICAL DATA:  Headache, weakness, nausea, vomiting EXAM: CT HEAD WITHOUT CONTRAST TECHNIQUE: Contiguous axial images were obtained from the base of the skull through the vertex without intravenous contrast. COMPARISON:  12/16/2017 FINDINGS: Brain: There is intraventricular blood noted most pronounced in the right lateral ventricle, but also noted in the left lateral ventricle, 3rd and 4th ventricles. No visible intraparenchymal hemorrhage. Old right  posterior temporal and parietal infarcts, stable since prior study. There is atrophy and chronic small vessel disease changes. Vascular: No hyperdense vessel or unexpected calcification. Skull: No acute calvarial abnormality. Sinuses/Orbits: Visualized paranasal sinuses and mastoids clear. Orbital soft tissues unremarkable. Other: None IMPRESSION: Isolated intraventricular hemorrhage, most pronounced in the right lateral ventricle. No visible intraparenchymal hemorrhage. Old right posterior MCA infarct, stable. Atrophy, chronic small vessel disease. Critical Value/emergent results were called by telephone at the time of interpretation on 06/16/2018 at 1:00 am to Dr. Merrily Pew , who verbally acknowledged these results. Electronically Signed   By: Rolm Baptise M.D.   On: 06/16/2018 01:02   Mr Jodene Nam Head Wo Contrast  Result Date: 06/16/2018 CLINICAL DATA:  Headache and weakness for 2 days EXAM: MRI HEAD WITHOUT CONTRAST MRA HEAD WITHOUT CONTRAST TECHNIQUE: Multiplanar, multiecho pulse sequences of the brain and surrounding structures were obtained without intravenous contrast.  Angiographic images of the head were obtained using MRA technique without contrast. COMPARISON:  Head CT 06/16/2018 FINDINGS: MRI HEAD FINDINGS BRAIN: There is acute intraventricular hemorrhage, unchanged from the earlier head CT. Distribution of blood is unchanged. The midline structures are normal. There is an old right parietal lobe infarct. The white matter signal is normal for the patient's age. The cerebral and cerebellar volume are age-appropriate. No hydrocephalus. Susceptibility-sensitive sequences show no chronic microhemorrhage or superficial siderosis. No mass lesion. VASCULAR: The major intracranial arterial and venous sinus flow voids are normal. SKULL AND UPPER CERVICAL SPINE: Calvarial bone marrow signal is normal. There is no skull base mass. Visualized upper cervical spine and soft tissues are normal. SINUSES/ORBITS: No fluid levels or advanced mucosal thickening. No mastoid or middle ear effusion. The orbits are normal. MRA HEAD FINDINGS POSTERIOR CIRCULATION: --Vertebral arteries: Normal codominant configuration of V4 segments. --Posterior inferior cerebellar arteries (PICA): Patent origins from the vertebral arteries. --Anterior inferior cerebellar arteries (AICA): Patent origins from the basilar artery. --Basilar artery: Normal. --Superior cerebellar arteries: Normal. --Posterior cerebral arteries (PCA): Normal. Both are predominantly supplied by the posterior communicating arteries (p-comm). ANTERIOR CIRCULATION: --Intracranial internal carotid arteries: Normal. --Anterior cerebral arteries (ACA): Normal. Both A1 segments are present. Patent anterior communicating artery (a-comm). --Middle cerebral arteries (MCA): Normal. IMPRESSION: 1. Unchanged distribution of intraventricular hemorrhage, predominantly within the right lateral ventricle. No causative lesion identified. 2. No intracranial aneurysm, vascular malformation or occlusion. 3. 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 Electronically Signed    By: Ulyses Jarred M.D.   On: 06/16/2018 03:25   Mr Brain Wo Contrast  Result Date: 06/16/2018 CLINICAL DATA:  Headache and weakness for 2 days EXAM: MRI HEAD WITHOUT CONTRAST MRA HEAD WITHOUT CONTRAST TECHNIQUE: Multiplanar, multiecho pulse sequences of the brain and surrounding structures were obtained without intravenous contrast. Angiographic images of the head were obtained using MRA technique without contrast. COMPARISON:  Head CT 06/16/2018 FINDINGS: MRI HEAD FINDINGS BRAIN: There is acute intraventricular hemorrhage, unchanged from the earlier head CT. Distribution of blood is unchanged. The midline structures are normal. There is an old right parietal lobe infarct. The white matter signal is normal for the patient's age. The cerebral and cerebellar volume are age-appropriate. No hydrocephalus. Susceptibility-sensitive sequences show no chronic microhemorrhage or superficial siderosis. No mass lesion. VASCULAR: The major intracranial arterial and venous sinus flow voids are normal. SKULL AND UPPER CERVICAL SPINE: Calvarial bone marrow signal is normal. There is no skull base mass. Visualized upper cervical spine and soft tissues are normal. SINUSES/ORBITS: No fluid levels or advanced mucosal thickening. No mastoid or middle  ear effusion. The orbits are normal. MRA HEAD FINDINGS POSTERIOR CIRCULATION: --Vertebral arteries: Normal codominant configuration of V4 segments. --Posterior inferior cerebellar arteries (PICA): Patent origins from the vertebral arteries. --Anterior inferior cerebellar arteries (AICA): Patent origins from the basilar artery. --Basilar artery: Normal. --Superior cerebellar arteries: Normal. --Posterior cerebral arteries (PCA): Normal. Both are predominantly supplied by the posterior communicating arteries (p-comm). ANTERIOR CIRCULATION: --Intracranial internal carotid arteries: Normal. --Anterior cerebral arteries (ACA): Normal. Both A1 segments are present. Patent anterior  communicating artery (a-comm). --Middle cerebral arteries (MCA): Normal. IMPRESSION: 1. Unchanged distribution of intraventricular hemorrhage, predominantly within the right lateral ventricle. No causative lesion identified. 2. No intracranial aneurysm, vascular malformation or occlusion. 3. 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 Electronically Signed   By: Ulyses Jarred M.D.   On: 06/16/2018 03:25   Dg Chest Portable 1 View  Result Date: 06/16/2018 CLINICAL DATA:  Cough, tachycardia EXAM: PORTABLE CHEST 1 VIEW COMPARISON:  12/18/2017 FINDINGS: Heart and mediastinal contours are within normal limits. No focal opacities or effusions. No acute bony abnormality. IMPRESSION: No active disease. Electronically Signed   By: Rolm Baptise M.D.   On: 06/16/2018 01:27    MDM   Labs without significant abnormality compared to prior.  CT headIMPRESSION: Expected/resolving evolutionary findings related to previous intraventricular hemorrhage. There is only a small amount residual hyperdense blood products visible dependent in the occipital horns of the lateral ventricles. The patient has not developed hydrocephalus. Ventricular size is baseline. Extensive atrophy, chronic small-vessel ischemic changes of the white matter and old cortical and subcortical infarctions in the right hemisphere as noted above. Electronically Signed   By: Nelson Chimes M.D.   On: 06/22/2018 17:18   17:25: RE-EVAL: Patient feeling improved following morphine/zofran in the ER.   17:31: CONSULT: Discussed w/ neurologist Dr. Lorraine Lax- will review patient case & call back  17:40: CONSULT: Re-discussed w/ Dr. Lorraine Lax who has reviewed prior admission & imaging given no acute change in patient's headache with improving CT can discharge home with neurology follow up. Recommends starting zofran & q6h tylenol.   Plan carried out as discussed, patient agreeable. I discussed results, treatment plan, need for follow-up, and return precautions with the patient.  Provided opportunity for questions, patient confirmed understanding and is in agreement with plan.   Findings and plan of care discussed with supervising physician Dr. Rogene Houston who is in agreement.   Leafy Kindle 06/22/18 1806    Fredia Sorrow, MD 06/22/18 1819  Shortly following setting disposition to discharge RN spoke w/ patient's family & I ultimately discussed w/  patient's son Cathlean Sauer [phone]).  Patient's family expressed concern about patient's need for inpatient rehab as she is generally weak and the individual that assessed her with home health felt similarly. They recommended she come to the hospital for inpatient rehab placement. Will consult case management/social work   19:08: CONSULT: Discussed w/ Lucia Bitter RN w/ CM- will have social work eval patient. She did not meet inpatient criteria during last admission.   19:42: CONSULT: Discussed w/ Lamonte Richer w/ social work, patient agreeable to placement. Need PT eval to determine if she meets criteria. I have placed this consult.   RN spoke w/ Network engineer and ultimately informed me that PT not available this evening.   Re-discussed w/ CM/CSW- can board in the ER overnight, PT evaluation in the AM.   I called and re discussed options with patient's family as did CSW team.  They would prefer for her to remain in the  emergency department this evening for PT evaluation in the morning. Patient is agreeable. Patient's home meds have been reordered.  Placed in observation status.   Leafy Kindle 06/22/18 2207   Re-discussed w/ Mariann Laster RN w/ CM- requesting rapid covid testing for placement purposes which has been ordered at request.    Amaryllis Dyke, PA-C 06/22/18 2256  Patient remains resting comfortably awaiting PT in the AM.    Amaryllis Dyke, PA-C 06/23/18 0057    Fredia Sorrow, MD 06/27/18 [phone]

## 2018-06-22 NOTE — ED Notes (Signed)
Per PA Sam, Pt will be observation in the ED until the morning when PT can consult on pt. Pt does not need IV. Pt aware.
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
## 2018-06-22 NOTE — ED Notes (Signed)
This RN called pt's dtr, Dorian Pod and informed her that pt was going to be discharged/went over d/c instructions with dtr. Pt dtr will be picking her up.

## 2018-06-22 NOTE — Care Management (Signed)
ED CM and CSW discussed patient's and family's concerns with patient not having 24/7 supervision and assistance.  As per family patient [place] unable to ambulate with walker or toilet without assistance and family is at work most of the day. Patient and family's goals are for Rehab placement.  CSW explained the process for placement, PT will need to reevaluate and insurance would need to review for the need for Rehab.  Patient will remain in ED while waiting on PT evaluation.  ED CM will hand off to  Daytime.

## 2018-06-23 LAB — GLUCOSE, CAPILLARY
Glucose-Capillary: 117 mg/dL — ABNORMAL HIGH (ref 70–99)
Glucose-Capillary: 130 mg/dL — ABNORMAL HIGH (ref 70–99)
Glucose-Capillary: 124 mg/dL — ABNORMAL HIGH (ref 70–99)
Glucose-Capillary: 89 mg/dL (ref 70–99)

## 2018-06-23 LAB — SARS CORONAVIRUS 2 BY RT PCR (HOSPITAL ORDER, PERFORMED IN ~~LOC~~ HOSPITAL LAB): SARS Coronavirus 2: NEGATIVE

## 2018-06-23 MED ORDER — ACETAMINOPHEN 500 MG PO TABS: 500.0000 mg | ORAL_TABLET | Freq: Four times a day (QID) | ORAL | Status: DC | PRN

## 2018-06-23 MED ORDER — ONDANSETRON 4 MG PO TBDP: 4.0000 mg | ORAL_TABLET | Freq: Three times a day (TID) | ORAL | Status: DC | PRN

## 2018-06-23 NOTE — Progress Notes (Signed)
CSW received call from Mertha Baars, patient's daughter in law regarding her being on the way to retrieve patient. Dorian Pod stated she would be at [facility] by 3:30pm. CSW notified patient's EMT of DIL being on the way.  Madilyn Fireman, MSW, [place] Clinical Social Worker Emergency Department [employer] [phone]

## 2018-06-23 NOTE — Evaluation (Signed)
Physical Therapy Evaluation Patient Details Name: Melinda Eaton MRN: [id number] DOB: [birth year] Today's Date: 06/23/2018   History of Present Illness  Patient is a 78 y/o female who presents with persistent HA and nausea. 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 PMH includes CVA, HTN, Hepatitis, macular degeneration, anxiety.   Clinical Impression  Pt evaluated in ED. On PT evaluation, pt reporting fatigue but no headache or nausea. Ambulating block around unit with no assistive device and supervision. Able to perform high level balance activities I.e. head turns, stops/starts, turns, stepping over obstacles without overt loss of balance. Donned/doffed shoes independently. Does present as increased risk of falls based on history of falls and decreased gait speed, therefore would recommend HHPT at discharge. Could benefit from [facility] aide to assist with IADL's as pt family works during the day.     Follow Up Recommendations Home health PT [facility], [place] aide)    Equipment Recommendations  None recommended by PT    Recommendations for Other Services       Precautions / Restrictions Precautions Precautions: Fall Restrictions Weight Bearing Restrictions: No      Mobility  Bed Mobility Overal bed mobility: Modified Independent                Transfers Overall transfer level: Needs assistance Equipment used: None Transfers: Sit to/from Stand Sit to Stand: Supervision            Ambulation/Gait Ambulation/Gait assistance: Supervision Gait Distance (Feet): 200 Feet Assistive device: None Gait Pattern/deviations: Step-through pattern;Decreased stride length Gait velocity: decreased Gait velocity interpretation: <1.8 ft/sec, indicate of risk for recurrent falls General Gait Details: No overt LOB  Stairs            Wheelchair Mobility    Modified Rankin (Stroke Patients Only)       Balance Overall balance assessment: Needs assistance    Sitting balance-Leahy Scale: Good       Standing balance-Leahy Scale: Good                               Pertinent Vitals/Pain Pain Assessment: No/denies pain    Home Living Family/patient expects to be discharged to:: Private residence Living Arrangements: Children Available Help at Discharge: Family;Available PRN/intermittently Type of Home: House Home Access: Stairs to enter Entrance Stairs-Rails: None Entrance Stairs-Number of Steps: 2 Home Layout: One level Home Equipment: Shower seat      Prior Function Level of Independence: Independent with assistive device(s)         Comments: daughter in law assists with medication prep and cooking     Hand Dominance   Dominant Hand: Right    Extremity/Trunk Assessment   Upper Extremity Assessment Upper Extremity Assessment: Overall WFL for tasks assessed    Lower Extremity Assessment Lower Extremity Assessment: Overall WFL for tasks assessed    Cervical / Trunk Assessment Cervical / Trunk Assessment: Kyphotic  Communication   Communication: No difficulties  Cognition Arousal/Alertness: Awake/alert Behavior During Therapy: WFL for tasks assessed/performed Overall Cognitive Status: History of cognitive impairments - at baseline                                 General Comments: prior residual deficits from stroke. occasionally slow processing at times      General Comments      Exercises     Assessment/Plan  PT Assessment Patient needs continued PT services  PT Problem List Decreased balance;Pain;Cardiopulmonary status limiting activity;Decreased knowledge of use of DME;Decreased mobility;Decreased activity tolerance       PT Treatment Interventions Functional mobility training;Balance training;Patient/family education;Therapeutic activities;Gait training;Therapeutic exercise;Neuromuscular re-education    PT Goals (Current goals can be found in the Care Plan section)   Acute Rehab PT Goals Patient Stated Goal: "to do whatever I need to do." PT Goal Formulation: With patient Time For Goal Achievement: 07/07/18 Potential to Achieve Goals: Good    Frequency Min 3X/week   Barriers to discharge        Co-evaluation               AM-PAC PT "6 Clicks" Mobility  Outcome Measure Help needed turning from your back to your side while in a flat bed without using bedrails?: None Help needed moving from lying on your back to sitting on the side of a flat bed without using bedrails?: None Help needed moving to and from a bed to a chair (including a wheelchair)?: None Help needed standing up from a chair using your arms (e.g., wheelchair or bedside chair)?: None Help needed to walk in hospital room?: None Help needed climbing 3-5 steps with a railing? : A Little 6 Click Score: 23    End of Session   Activity Tolerance: Patient tolerated treatment well Patient left: in bed;with call bell/phone within reach Nurse Communication: Mobility status PT Visit Diagnosis: Unsteadiness on feet (R26.81);Difficulty in walking, not elsewhere classified (R26.2)    Time: 3557-3220 PT Time Calculation (min) (ACUTE ONLY): 13 min   Charges:   PT Evaluation $PT Eval Moderate Complexity: 1 Mod          Ellamae Sia, [place], DPT Acute Rehabilitation Services Pager [phone] Office [phone]   Willy Eddy 06/23/2018, 8:44 AM

## 2018-06-23 NOTE — ED Notes (Signed)
Ordered bfast tray 

## 2018-06-23 NOTE — ED Provider Notes (Signed)
Care assumed from Dr. Melina Copa at sign over of patient, please see their notes for full documentation of patient's complaint/HPI. Briefly, pt here with headache and nausea due to recent intraventricular hemorrhage, recently discharged 06/20/18; returned yesterday and was worked up. Results so far show SARSCov2 neg, CBC w/diff with mild anemia, CMP fairly unremarkable, CT head showing expected/resolving evolutionary findings related to prior Intraventricular hemorrhage with only small amount residual in occipital horns of lateral ventricle. Pt was here overnight awaiting placement, social work has assisted in getting her home with increased home assistance, we are just waiting for her ride to come get her at 3pm.    Physical Exam  BP (!) 149/82   Pulse 86   Temp 98.4 F (36.9 C) (Oral)   Resp 16   Ht 5\' 3"  (1.6 m)   Wt 65.8 kg   SpO2 94%   BMI 25.70 kg/m   Physical Exam Gen: afebrile, VSS, NAD HEENT: EOMI, MMM Resp: no resp distress CV: rate WNL Abd: appearance normal, nondistended MsK: moving all extremities with ease Neuro: A&O  ED Course/Procedures    Results for orders placed or performed during the hospital encounter of 06/22/18  SARS Coronavirus 2 [facility] order, Performed in [facility] hospital lab)  Result Value Ref Range   SARS Coronavirus 2 NEGATIVE NEGATIVE  CBC with Differential  Result Value Ref Range   WBC 9.6 4.0 - 10.5 K/uL   RBC 4.03 3.87 - 5.11 MIL/uL   Hemoglobin 11.4 (L) 12.0 - 15.0 g/dL   HCT 35.3 (L) 36.0 - 46.0 %   MCV 87.6 80.0 - 100.0 fL   MCH 28.3 26.0 - 34.0 pg   MCHC 32.3 30.0 - 36.0 g/dL   RDW 13.8 11.5 - 15.5 %   Platelets 244 150 - 400 K/uL   nRBC 0.0 0.0 - 0.2 %   Neutrophils Relative % 81 %   Neutro Abs 7.8 (H) 1.7 - 7.7 K/uL   Lymphocytes Relative 10 %   Lymphs Abs 1.0 0.7 - 4.0 K/uL   Monocytes Relative 8 %   Monocytes Absolute 0.7 0.1 - 1.0 K/uL   Eosinophils Relative 1 %   Eosinophils Absolute 0.1 0.0 - 0.5 K/uL   Basophils Relative  0 %   Basophils Absolute 0.0 0.0 - 0.1 K/uL   Immature Granulocytes 0 %   Abs Immature Granulocytes 0.03 0.00 - 0.07 K/uL  Comprehensive metabolic panel  Result Value Ref Range   Sodium 132 (L) 135 - 145 mmol/L   Potassium 4.3 3.5 - 5.1 mmol/L   Chloride 98 98 - 111 mmol/L   CO2 24 22 - 32 mmol/L   Glucose, Bld 125 (H) 70 - 99 mg/dL   BUN 9 8 - 23 mg/dL   Creatinine, Ser 0.83 0.44 - 1.00 mg/dL   Calcium 8.4 (L) 8.9 - 10.3 mg/dL   Total Protein 5.6 (L) 6.5 - 8.1 g/dL   Albumin 2.6 (L) 3.5 - 5.0 g/dL   AST 31 15 - 41 U/L   ALT 8 0 - 44 U/L   Alkaline Phosphatase 45 38 - 126 U/L   Total Bilirubin 1.3 (H) 0.3 - 1.2 mg/dL   GFR calc non Af Amer >60 >60 mL/min   GFR calc Af Amer >60 >60 mL/min   Anion gap 10 5 - 15   Ct Head Wo Contrast  Result Date: 06/22/2018 CLINICAL DATA:  Headache and nausea. Previous intraventricular hemorrhage 06/16/2018. EXAM: CT HEAD WITHOUT CONTRAST TECHNIQUE: Contiguous axial images were  obtained from the base of the skull through the vertex without intravenous contrast. COMPARISON:  06/16/2018. FINDINGS: Brain: Background pattern of generalized atrophy, chronic small-vessel ischemic changes of the white matter, old infarction in the right parieto-occipital brain and right parietal brain appear the same. No sign of acute infarction, mass lesion or extra-axial collection. Previously seen intraventricular hemorrhage is evidence only by a small amount of residual hyperdense blood in the occipital horns of the lateral ventricles. Ventricular size is normal without hydrocephalus. Vascular: There is atherosclerotic calcification of the major vessels at the base of the brain. Skull: Negative Sinuses/Orbits: Clear/normal Other: None IMPRESSION: Expected/resolving evolutionary findings related to previous intraventricular hemorrhage. There is only a small amount residual hyperdense blood products visible dependent in the occipital horns of the lateral ventricles. The patient  has not developed hydrocephalus. Ventricular size is baseline. Extensive atrophy, chronic small-vessel ischemic changes of the white matter and old cortical and subcortical infarctions in the right hemisphere as noted above. Electronically Signed   By: Nelson Chimes M.D.   On: 06/22/2018 17:18   Ct Head Wo Contrast  Result Date: 06/16/2018 CLINICAL DATA:  Follow-up intracranial hemorrhage. EXAM: CT HEAD WITHOUT CONTRAST TECHNIQUE: Contiguous axial images were obtained from the base of the skull through the vertex without intravenous contrast. COMPARISON:  Head CT and MRI 06/16/2018 FINDINGS: Brain: Intraventricular hemorrhage in the right greater than left lateral ventricles, third ventricle, and fourth ventricle demonstrates minimal interval redistribution but has not significantly changed in overall volume. The ventricles are unchanged in size with mild dilatation of the temporal horns compared to a 12/16/2017 CT. A moderate-sized chronic posterior right MCA infarct is again noted. No acute infarct, midline shift, or extra-axial fluid collection is identified. Patchy hypodensities in the cerebral white matter bilaterally are nonspecific but compatible with moderate chronic small vessel ischemic disease. Vascular: Calcified atherosclerosis at the skull base. No hyperdense vessel. Skull: No fracture or suspicious osseous lesion. Sinuses/Orbits: Paranasal sinuses and mastoid air cells are clear. Unremarkable orbits. Other: None. IMPRESSION: 1. Unchanged intraventricular hemorrhage. 2. Unchanged size of the ventricles. 3. Chronic right MCA infarct. Electronically Signed   By: Logan Bores M.D.   On: 06/16/2018 12:49   Ct Head Wo Contrast  Result Date: 06/16/2018 CLINICAL DATA:  Headache, weakness, nausea, vomiting EXAM: CT HEAD WITHOUT CONTRAST TECHNIQUE: Contiguous axial images were obtained from the base of the skull through the vertex without intravenous contrast. COMPARISON:  12/16/2017 FINDINGS: Brain:  There is intraventricular blood noted most pronounced in the right lateral ventricle, but also noted in the left lateral ventricle, 3rd and 4th ventricles. No visible intraparenchymal hemorrhage. Old right posterior temporal and parietal infarcts, stable since prior study. There is atrophy and chronic small vessel disease changes. Vascular: No hyperdense vessel or unexpected calcification. Skull: No acute calvarial abnormality. Sinuses/Orbits: Visualized paranasal sinuses and mastoids clear. Orbital soft tissues unremarkable. Other: None IMPRESSION: Isolated intraventricular hemorrhage, most pronounced in the right lateral ventricle. No visible intraparenchymal hemorrhage. Old right posterior MCA infarct, stable. Atrophy, chronic small vessel disease. Critical Value/emergent results were called by telephone at the time of interpretation on 06/16/2018 at 1:00 am to Dr. Merrily Pew , who verbally acknowledged these results. Electronically Signed   By: Rolm Baptise M.D.   On: 06/16/2018 01:02   Mr Jodene Nam Head Wo Contrast  Result Date: 06/16/2018 CLINICAL DATA:  Headache and weakness for 2 days EXAM: MRI HEAD WITHOUT CONTRAST MRA HEAD WITHOUT CONTRAST TECHNIQUE: Multiplanar, multiecho pulse sequences of the brain and surrounding  structures were obtained without intravenous contrast. Angiographic images of the head were obtained using MRA technique without contrast. COMPARISON:  Head CT 06/16/2018 FINDINGS: MRI HEAD FINDINGS BRAIN: There is acute intraventricular hemorrhage, unchanged from the earlier head CT. Distribution of blood is unchanged. The midline structures are normal. There is an old right parietal lobe infarct. The white matter signal is normal for the patient's age. The cerebral and cerebellar volume are age-appropriate. No hydrocephalus. Susceptibility-sensitive sequences show no chronic microhemorrhage or superficial siderosis. No mass lesion. VASCULAR: The major intracranial arterial and venous sinus  flow voids are normal. SKULL AND UPPER CERVICAL SPINE: Calvarial bone marrow signal is normal. There is no skull base mass. Visualized upper cervical spine and soft tissues are normal. SINUSES/ORBITS: No fluid levels or advanced mucosal thickening. No mastoid or middle ear effusion. The orbits are normal. MRA HEAD FINDINGS POSTERIOR CIRCULATION: --Vertebral arteries: Normal codominant configuration of V4 segments. --Posterior inferior cerebellar arteries (PICA): Patent origins from the vertebral arteries. --Anterior inferior cerebellar arteries (AICA): Patent origins from the basilar artery. --Basilar artery: Normal. --Superior cerebellar arteries: Normal. --Posterior cerebral arteries (PCA): Normal. Both are predominantly supplied by the posterior communicating arteries (p-comm). ANTERIOR CIRCULATION: --Intracranial internal carotid arteries: Normal. --Anterior cerebral arteries (ACA): Normal. Both A1 segments are present. Patent anterior communicating artery (a-comm). --Middle cerebral arteries (MCA): Normal. IMPRESSION: 1. Unchanged distribution of intraventricular hemorrhage, predominantly within the right lateral ventricle. No causative lesion identified. 2. No intracranial aneurysm, vascular malformation or occlusion. 3. 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 Electronically Signed   By: Ulyses Jarred M.D.   On: 06/16/2018 03:25   Mr Brain Wo Contrast  Result Date: 06/16/2018 CLINICAL DATA:  Headache and weakness for 2 days EXAM: MRI HEAD WITHOUT CONTRAST MRA HEAD WITHOUT CONTRAST TECHNIQUE: Multiplanar, multiecho pulse sequences of the brain and surrounding structures were obtained without intravenous contrast. Angiographic images of the head were obtained using MRA technique without contrast. COMPARISON:  Head CT 06/16/2018 FINDINGS: MRI HEAD FINDINGS BRAIN: There is acute intraventricular hemorrhage, unchanged from the earlier head CT. Distribution of blood is unchanged. The midline structures are normal.  There is an old right parietal lobe infarct. The white matter signal is normal for the patient's age. The cerebral and cerebellar volume are age-appropriate. No hydrocephalus. Susceptibility-sensitive sequences show no chronic microhemorrhage or superficial siderosis. No mass lesion. VASCULAR: The major intracranial arterial and venous sinus flow voids are normal. SKULL AND UPPER CERVICAL SPINE: Calvarial bone marrow signal is normal. There is no skull base mass. Visualized upper cervical spine and soft tissues are normal. SINUSES/ORBITS: No fluid levels or advanced mucosal thickening. No mastoid or middle ear effusion. The orbits are normal. MRA HEAD FINDINGS POSTERIOR CIRCULATION: --Vertebral arteries: Normal codominant configuration of V4 segments. --Posterior inferior cerebellar arteries (PICA): Patent origins from the vertebral arteries. --Anterior inferior cerebellar arteries (AICA): Patent origins from the basilar artery. --Basilar artery: Normal. --Superior cerebellar arteries: Normal. --Posterior cerebral arteries (PCA): Normal. Both are predominantly supplied by the posterior communicating arteries (p-comm). ANTERIOR CIRCULATION: --Intracranial internal carotid arteries: Normal. --Anterior cerebral arteries (ACA): Normal. Both A1 segments are present. Patent anterior communicating artery (a-comm). --Middle cerebral arteries (MCA): Normal. IMPRESSION: 1. Unchanged distribution of intraventricular hemorrhage, predominantly within the right lateral ventricle. No causative lesion identified. 2. No intracranial aneurysm, vascular malformation or occlusion. 3. 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 Electronically Signed   By: Ulyses Jarred M.D.   On: 06/16/2018 03:25   Dg Chest Portable 1 View  Result Date: 06/16/2018 CLINICAL DATA:  Cough, tachycardia EXAM: PORTABLE CHEST 1 VIEW COMPARISON:  12/18/2017 FINDINGS: Heart and mediastinal contours are within normal limits. No focal opacities or effusions. No acute  bony abnormality. IMPRESSION: No active disease. Electronically Signed   By: Rolm Baptise M.D.   On: 06/16/2018 01:27     Meds ordered this encounter  Medications  . morphine 2 MG/ML injection 2 mg  . ondansetron (ZOFRAN) injection 4 mg  . ondansetron (ZOFRAN ODT) 4 MG disintegrating tablet    Sig: Take 1 tablet (4 mg total) by mouth every 8 (eight) hours as needed for nausea or vomiting.    Dispense:  6 tablet    Refill:  0    Order Specific Question:   Supervising Provider    Answer:   MILLER, BRIAN [[id number]]  . acetaminophen (TYLENOL) 500 MG tablet    Sig: Take 1 tablet (500 mg total) by mouth every 6 (six) hours as needed for headache.    Dispense:  20 tablet    Refill:  0    Order Specific Question:   Supervising Provider    Answer:   MILLER, BRIAN [[id number]]  . atorvastatin (LIPITOR) tablet 40 mg  . carvedilol (COREG) tablet 3.125 mg  . finasteride (PROSCAR) tablet 5 mg  . levofloxacin (LEVAQUIN) tablet 750 mg  . losartan (COZAAR) tablet 50 mg  . metFORMIN (GLUCOPHAGE) tablet 500 mg  . metroNIDAZOLE (FLAGYL) tablet 500 mg  . multivitamin (PROSIGHT) tablet 2 tablet  . nicotine (NICODERM CQ - dosed in mg/24 hours) patch 14 mg  . polyethylene glycol (MIRALAX / GLYCOLAX) packet 17 g  . zinc oxide (BALMEX) 11.3 % cream  . acetaminophen (TYLENOL) tablet 500 mg  . ondansetron (ZOFRAN-ODT) disintegrating tablet 4 mg     MDM:   ICD-10-CM   1. Nonintractable headache, unspecified chronicity pattern, unspecified headache type R51     12:22 PM Pt updated, has no complaints currently. Awaiting her ride to get here around 3pm. Will continue to monitor.   3:49 PM Pt's ride here. Pt has no complaints currently, up and walking around in NAD. Advised that she follow up with neurology as previously instructed. Advised use of tylenol and zofran rx's already written by prior PA-C. I explained the diagnosis and have given explicit precautions to return to the ER including for any other new or  worsening symptoms. The patient understands and accepts the medical plan as it's been dictated and I have answered their questions. Discharge instructions concerning home care and prescriptions have been given. The patient is STABLE and is discharged to home in good condition.      [address], [place], [place] 06/23/18 [place]    Hayden Rasmussen, MD 06/23/18 1758

## 2018-06-23 NOTE — ED Notes (Signed)
Pt ambulatory to restroom

## 2018-06-23 NOTE — ED Notes (Signed)
Nurse navigator explained plan of care to patient. Attempted to call daughter in law no answer.

## 2018-06-23 NOTE — TOC Transition Note (Signed)
Transition of Care [facility]) - CM/SW Discharge Note   Patient Details  Name: Melinda Eaton MRN: [id number] Date of Birth: [birth year]  [facility]) CM/SW Contact:  Fuller Mandril, RN Phone Number: 06/23/2018, 9:21 AM   Clinical Narrative:     Pt returned to hospital for PT evaluation for SNF placement.    Barriers to Discharge: Barriers Resolved   Patient Goals and CMS Choice        Discharge Placement     Pt currently active with [facility] services.  Resumption of care requested. [facility] of [place] notified.  No DME needs identified at this time.                    Discharge Plan and Services In-house Referral: Clinical Social Work Discharge Planning Services: CM Consult Post Acute Care Choice: Resumption of Svcs/PTA Provider                        Date HH Agency Contacted: 06/23/18 Time [place]: [phone] Representative spoke with at [place]: [place] ([place]) Interventions     Readmission Risk Interventions Readmission Risk Prevention Plan 06/18/2018  Transportation Screening Complete  PCP or Specialist Appt within 5-7 Days Complete  Home Care Screening Complete  Medication Review (RN CM) Complete  Some recent data might be hidden

## 2018-06-23 NOTE — ED Provider Notes (Signed)
Patient signed out to me from Dr. Leonette Monarch.  78 year old female with recent stroke and intraventricular hemorrhage here for decline at home. Physical Exam  BP (!) 145/81   Pulse 90   Temp 98.4 F (36.9 C) (Oral)   Resp 16   Ht 5\' 3"  (1.6 m)   Wt 65.8 kg   SpO2 97%   BMI 25.70 kg/m   Physical Exam  ED Course/Procedures     Procedures  MDM  Patient apparently is being evaluated for potential placement to rehab.  I just received a call from case management that the patient has been cleared to return home with increased services.  They are reaching out to family.  Anticipate the patient will be discharged when family here to pick her up       Hayden Rasmussen, MD 06/23/18 1750

## 2018-06-23 NOTE — Progress Notes (Addendum)
9:15am: CSW received call from PT regarding her completed evaluation. PT informed CSW that the PT recommendation would not be for SNF but for discharge home with the assistance of an aide. CSW informed RN CM and RN stated that patient is already set up with home health services. RN CM notified MD that patient can discharge home.  CSW attempted two calls to the relative listed on patient's face sheet with no answer. CSW will continue attempting to reach patient's relative to discuss discharge.  7:50am: CSW received handoff from [facility] [place] regarding the need for a PT evaluation for this patient for a possible SNF placement. CSW will continue following until PT completes their evaluation and will make appropriate plans for discharge.  Madilyn Fireman, MSW, [place] Clinical Social Worker Emergency Department [employer] [phone]

## 2018-06-23 NOTE — ED Notes (Signed)
Patient verbalizes understanding of discharge instructions. Opportunity for questioning and answers were provided. Armband removed by staff, pt discharged from ED.  

## 2018-06-23 NOTE — Progress Notes (Signed)
CSW spoke with patient's daughter in law Melinda Eaton regarding the plan for the patient to return home today with [facility] services. Melinda Eaton is agreeable to plan and stated she gets off work at 2:30pm but will be to pick the patient up from [facility] by 3pm.  No further CSW needs.  Madilyn Fireman, MSW, [place] Clinical Social Worker Emergency Department [employer] [phone]

## 2018-06-27 ENCOUNTER — Other Ambulatory Visit: Payer: Self-pay

## 2018-06-27 ENCOUNTER — Telehealth: Payer: Self-pay

## 2018-06-27 ENCOUNTER — Ambulatory Visit (INDEPENDENT_AMBULATORY_CARE_PROVIDER_SITE_OTHER): Payer: Medicare PPO | Admitting: Neurology

## 2018-06-27 DIAGNOSIS — I615 Nontraumatic intracerebral hemorrhage, intraventricular: Secondary | ICD-10-CM

## 2018-06-27 MED ORDER — ASPIRIN 81 MG PO TABS: 81.0000 mg | ORAL_TABLET | Freq: Every day | ORAL | 0 refills | Status: AC

## 2018-06-27 NOTE — Telephone Encounter (Signed)
Referrals sent email to pts daughter for visit on 06/26/2018 for doxy visit.

## 2018-06-27 NOTE — Progress Notes (Signed)
Virtual Visit via Video Note  I connected with Melinda Eaton on 06/27/18 at  4:00 PM EDT by a video enabled telemedicine application and verified that I am speaking with the correct person using two identifiers.  This visit was performed using doxy.me application for video and was facilitated by patient's daughter who was present throughout this visit with her  Location: Patient: at her home Provider: at [facility] office  I discussed the limitations of evaluation and management by telemedicine and the availability of in person appointments. The patient expressed understanding and agreed to proceed.  History of Present Illness: Melinda Eaton is a 78 year old pleasant Caucasian lady seen today for virtual video follow-up visit following recent hospital admission for intraventricular hemorrhage.  History is obtained from the patient, review of electronic medical records and patient's daughter who was present throughout this visit.  She presented to [facility] on 06/16/2018 with 2-day history of headache, weakness and nausea and vomiting.  On arrival in the emergency room she was noted to be tachycardic and hypertensive and had a left facial droop.  Blood pressure on admission in the ER was 167/94.  CT scan of the head showed extensive intraventricular hemorrhage most pronounced in the right lateral ventricle but also involving the third and fourth ventricles with no visible intraparenchymal hemorrhage.  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  Patient was admitted to the intensive care unit and blood pressure was tightly controlled and required initially IV Cardene.  Patient remained stable throughout hospitalization and passed a swallow eval and was able to start on p.o. medications.  MRI scan of the brain was obtained which showed stable appearance of the right intraventricular hemorrhage and old right parietal infarct.  No changes of cerebral amyloid angiopathy were noted.  MRI of  the brain showed no AVM or aneurysm.  Transthoracic echo showed ejection fraction of 35% which suggested cardiomyopathy.  Patient had no prior known history of cardiac disease hence cardiology was consulted and started the patient on low-dose Coreg and angiotensin receptor blockade drug.  Patient had known prior history of right hemispheric infarct in October 2017 at that time she had presented with dysarthria and left facial droop in the hospital in [facility].  She was found to have a 70% right carotid stenosis for which he underwent interval right carotid endarterectomy in [facility] by Dr. Doren Custard in December 2017.  She did well from the surgery and and last follow-up carotid ultrasound on June 2018 had shown 40 to 59% stenosis on the endarterectomy site.  Patient's hemoglobin A1c was found to be elevated at 8.2.  She was started on metformin in the hospital.  Patient daughter feels that she does not react well with metformin and she discontinued it.  Patient in fact complained of severe headache and return back to the hospital on the day she was discharged on 06/23/2018.  She was seen in the emergency room and headache was treated with morphine and Zofran with good relief.  Repeat CT scan was obtained which showed stable appearance of resolving intraventricular hemorrhage without hydrocephalus or any new findings.  Patient states since returning home her headache seems to have improved.  She complains of mild heaviness of her head but this is not bothersome.  She takes Tylenol around once a day also and is not sure it provides any specific relief.  She has not been eating very well and daughter feels she may have lost some weight.  She  has called her primary care physician and has an appointment to see him day after tomorrow.  The daughter has been checking patient's blood pressure at home and has been quite stable and today on 2 occasions it was 136/78 and 126/60.  Patient is getting home physical  therapy and she walks a little bit with a walker but overall she seems to be weaker and has less stamina.  She has had no falls or injuries.   Observations/Objective: Physical and neurological exam are limited due to constraints from video visit.  Frail pleasant elderly Caucasian lady who appears not in distress.  She is sleepy but can be easily aroused.  She is awake alert she is oriented to person and place.  She has diminished attention, registration and recall is 2/3.  She is able to name only 6 animals which walk on 4 legs.  Speech appears clear without dysarthria or aphasia.  Extraocular movements appear full range without nystagmus.  Face is symmetric with only subtle left facial droop when she smiles.  Tongue is midline.  She is able to elevate both upper extremities against gravity.  Her gait was deferred.  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  She had post hemorrhage headaches which appear to be improving.  She has mild cognitive impairment which may be resultant of the hemorrhage as well as her age.  Follow Up Instructions: I had a long discussion with the patient and her daughter regarding her recent intraventricular hemorrhage and resultant mild headache and cognitive impairment and expect this to improve with time.  I recommend strict control of hypertension with blood pressure goal below 140/90.  I also recommend starting aspirin 81 mg daily as she has had previous strokes and carotid surgery.  She was also advised to see her primary care physician to discuss alternative treatments for her diabetes as she had trouble tolerating metformin.  She has a scheduled appointment to see family physician in 2 days and advised him to keep that appointment.  She was advised to use a walker at all times and continue home physical therapy.  We also discussed fall safety precautions.   She was advised to also follow-up with her cardiologist for her cardiomyopathy.   She will return for follow-up in the future in 3 months with my nurse practitioner call earlier if necessary.   I discussed the assessment and treatment plan with the patient. The patient was provided an opportunity to ask questions and all were answered. The patient agreed with the plan and demonstrated an understanding of the instructions.   The patient was advised to call back or seek an in-person evaluation if the symptoms worsen or if the condition fails to improve as anticipated.  I provided 25 minutes of non-face-to-face time during this encounter.   Antony Contras, MD

## 2018-07-03 ENCOUNTER — Telehealth: Payer: Self-pay | Admitting: *Deleted

## 2018-07-03 ENCOUNTER — Ambulatory Visit: Payer: Medicare PPO | Admitting: Physician Assistant

## 2018-07-03 NOTE — Telephone Encounter (Signed)
SPOKE WITH OT DTR IN LASE TO CONSENT TO DOXY.ME    Confirm consent - "In the setting of the current Covid19 crisis, you are scheduled for a (phone or video) visit with your provider on (date) at (time).  Just as we do with many in-office visits, in order for you to participate in this visit, we must obtain consent.  If you'd like, I can send this to your mychart (if signed up) or email for you to review.  Otherwise, I can obtain your verbal consent now.  All virtual visits are billed to your insurance company just like a normal visit would be.  By agreeing to a virtual visit, we'd like you to understand that the technology does not allow for your provider to perform an examination, and thus may limit your provider's ability to fully assess your condition. If your provider identifies any concerns that need to be evaluated in person, we will make arrangements to do so.  Finally, though the technology is pretty good, we cannot assure that it will always work on either your or our end, and in the setting of a video visit, we may have to convert it to a phone-only visit.  In either situation, we cannot ensure that we have a secure connection.  Are you willing to proceed?" STAFF: Did the patient verbally acknowledge consent to telehealth visit? Document YES/NO here: YES   TELEPHONE CALL NOTE  Melinda Eaton has been deemed a candidate for a follow-up tele-health visit to limit community exposure during the Covid-19 pandemic. I spoke with the patient via phone to ensure availability of phone/video source, confirm preferred email & phone number, and discuss instructions and expectations.  I reminded Melinda Eaton to be prepared with any vital sign and/or heart rhythm information that could potentially be obtained via home monitoring, at the time of her visit. I reminded Melinda Eaton to expect a phone call prior to her visit.  Claude Manges, [place] 07/03/2018 2:17 PM   FULL LENGTH CONSENT FOR  TELE-HEALTH VISIT   I hereby voluntarily request, consent and authorize [facility] and its employed or contracted physicians, physician assistants, nurse practitioners or other licensed health care professionals (the Practitioner), to provide me with telemedicine health care services (the "Services") as deemed necessary by the treating Practitioner. I acknowledge and consent to receive the Services by the Practitioner via telemedicine. I understand that the telemedicine visit will involve communicating with the Practitioner through live audiovisual communication technology and the disclosure of certain medical information by electronic transmission. I acknowledge that I have been given the opportunity to request an in-person assessment or other available alternative prior to the telemedicine visit and am voluntarily participating in the telemedicine visit.  I understand that I have the right to withhold or withdraw my consent to the use of telemedicine in the course of my care at any time, without affecting my right to future care or treatment, and that the Practitioner or I may terminate the telemedicine visit at any time. I understand that I have the right to inspect all information obtained and/or recorded in the course of the telemedicine visit and may receive copies of available information for a reasonable fee.  I understand that some of the potential risks of receiving the Services via telemedicine include:  Marland Kitchen Delay or interruption in medical evaluation due to technological equipment failure or disruption; . Information transmitted may not be sufficient (e.g. poor resolution of images) to allow for appropriate medical decision  making by the Practitioner; and/or  . In rare instances, security protocols could fail, causing a breach of personal health information.  Furthermore, I acknowledge that it is my responsibility to provide information about my medical history, conditions and care that is  complete and accurate to the best of my ability. I acknowledge that Practitioner's advice, recommendations, and/or decision may be based on factors not within their control, such as incomplete or inaccurate data provided by me or distortions of diagnostic images or specimens that may result from electronic transmissions. I understand that the practice of medicine is not an exact science and that Practitioner makes no warranties or guarantees regarding treatment outcomes. I acknowledge that I will receive a copy of this consent concurrently upon execution via email to the email address I last provided but may also request a printed copy by calling the office of [place].    I understand that my insurance will be billed for this visit.   I have read or had this consent read to me. . I understand the contents of this consent, which adequately explains the benefits and risks of the Services being provided via telemedicine.  . I have been provided ample opportunity to ask questions regarding this consent and the Services and have had my questions answered to my satisfaction. . I give my informed consent for the services to be provided through the use of telemedicine in my medical care  By participating in this telemedicine visit I agree to the above.

## 2018-07-04 ENCOUNTER — Telehealth: Payer: Self-pay | Admitting: Physician Assistant

## 2018-07-04 ENCOUNTER — Other Ambulatory Visit: Payer: Self-pay

## 2018-07-04 ENCOUNTER — Encounter: Payer: Self-pay | Admitting: Physician Assistant

## 2018-07-04 ENCOUNTER — Telehealth (INDEPENDENT_AMBULATORY_CARE_PROVIDER_SITE_OTHER): Payer: Medicare PPO | Admitting: Physician Assistant

## 2018-07-04 VITALS — BP 149/78 | HR 94 | Ht 63.0 in | Wt 134.0 lb

## 2018-07-04 DIAGNOSIS — I779 Disorder of arteries and arterioles, unspecified: Secondary | ICD-10-CM

## 2018-07-04 DIAGNOSIS — Z8679 Personal history of other diseases of the circulatory system: Secondary | ICD-10-CM

## 2018-07-04 DIAGNOSIS — I5042 Chronic combined systolic (congestive) and diastolic (congestive) heart failure: Secondary | ICD-10-CM | POA: Diagnosis not present

## 2018-07-04 DIAGNOSIS — I10 Essential (primary) hypertension: Secondary | ICD-10-CM

## 2018-07-04 DIAGNOSIS — Z7189 Other specified counseling: Secondary | ICD-10-CM

## 2018-07-04 MED ORDER — CARVEDILOL 6.25 MG PO TABS: 6.2500 mg | ORAL_TABLET | Freq: Two times a day (BID) | ORAL | 11 refills | Status: DC

## 2018-07-04 NOTE — Progress Notes (Signed)
thx Scott. Agree with plan

## 2018-07-04 NOTE — Telephone Encounter (Signed)
Caprice Red, OT with [facility] would like a call to be updated with the information from todays visit regarding changes to patients medications.
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
## 2018-07-04 NOTE — Patient Instructions (Signed)
Medication Instructions:  Increase carvedilol (Coreg) to 6.25 mg twice daily.  You can take 2 tablets of your 3.125 mg dose to equal 6.25 mg.  If you need a refill on your cardiac medications before your next appointment, please call your pharmacy.   Lab work: None  If you have labs (blood work) drawn today and your tests are completely normal, you will receive your results only by: Marland Kitchen MyChart Message (if you have MyChart) OR . A paper copy in the mail If you have any lab test that is abnormal or we need to change your treatment, we will call you to review the results.  Testing/Procedures: None  Follow-Up: At [facility], you and your health needs are our priority.  As part of our continuing mission to provide you with exceptional heart care, we have created designated Provider Care Teams.  These Care Teams include your primary Cardiologist (physician) and Advanced Practice Providers (APPs -  Physician Assistants and Nurse Practitioners) who all work together to provide you with the care you need, when you need it. Richardson Dopp, PA-C in 3 weeks  Any Other Special Instructions Will Be Listed Below (If Applicable).  Weigh daily Call if weight increases more than 3 pounds in 1 day Call if you develop leg swelling or worsening shortness of breath

## 2018-07-05 NOTE — Telephone Encounter (Signed)
  LMOVM OF CARVEDILOL CHANGES 6.25 MG  TWO DAILY  AND TO CONTACT CLINIC BACK TO CONFIRM HE HAS RECEIVED MESSAGE

## 2018-07-10 MED ORDER — METRONIDAZOLE 500 MG TABLET
ORAL_TABLET | 0 refills | 0 days | Status: CP
Start: 2018-07-10 — End: ?

## 2018-07-10 NOTE — Telephone Encounter (Signed)
Melinda Eaton called back to confirm he did receive message

## 2018-07-24 MED ORDER — LEVOFLOXACIN 750 MG TABLET
ORAL_TABLET | 0 refills | 0 days | Status: CP
Start: 2018-07-24 — End: ?

## 2018-07-24 NOTE — Unmapped (Signed)
Requesting refill on Levaquin. Last seen in the office on 05/01/18. Please advise, Thanks!

## 2018-08-06 ENCOUNTER — Inpatient Hospital Stay (HOSPITAL_COMMUNITY)
Admission: EM | Admit: 2018-08-06 | Discharge: 2018-08-09 | DRG: 552 | Disposition: A | Discharge status: Skilled Nursing Facility | Payer: Medicare HMO | Attending: Internal Medicine | Admitting: Internal Medicine

## 2018-08-06 ENCOUNTER — Telehealth: Payer: Self-pay | Admitting: Physician Assistant

## 2018-08-06 ENCOUNTER — Emergency Department (HOSPITAL_COMMUNITY): Payer: Medicare HMO

## 2018-08-06 ENCOUNTER — Other Ambulatory Visit: Payer: Self-pay

## 2018-08-06 ENCOUNTER — Encounter (HOSPITAL_COMMUNITY): Payer: Self-pay | Admitting: *Deleted

## 2018-08-06 DIAGNOSIS — S32010A Wedge compression fracture of first lumbar vertebra, initial encounter for closed fracture: Secondary | ICD-10-CM | POA: Diagnosis not present

## 2018-08-06 DIAGNOSIS — I10 Essential (primary) hypertension: Secondary | ICD-10-CM | POA: Diagnosis present

## 2018-08-06 DIAGNOSIS — Z8042 Family history of malignant neoplasm of prostate: Secondary | ICD-10-CM

## 2018-08-06 DIAGNOSIS — Z20828 Contact with and (suspected) exposure to other viral communicable diseases: Secondary | ICD-10-CM | POA: Diagnosis present

## 2018-08-06 DIAGNOSIS — E119 Type 2 diabetes mellitus without complications: Secondary | ICD-10-CM

## 2018-08-06 DIAGNOSIS — Z881 Allergy status to other antibiotic agents status: Secondary | ICD-10-CM

## 2018-08-06 DIAGNOSIS — Z8673 Personal history of transient ischemic attack (TIA), and cerebral infarction without residual deficits: Secondary | ICD-10-CM

## 2018-08-06 DIAGNOSIS — W07XXXA Fall from chair, initial encounter: Secondary | ICD-10-CM | POA: Diagnosis present

## 2018-08-06 DIAGNOSIS — I110 Hypertensive heart disease with heart failure: Secondary | ICD-10-CM | POA: Diagnosis present

## 2018-08-06 DIAGNOSIS — M4846XA Fatigue fracture of vertebra, lumbar region, initial encounter for fracture: Secondary | ICD-10-CM

## 2018-08-06 DIAGNOSIS — K219 Gastro-esophageal reflux disease without esophagitis: Secondary | ICD-10-CM | POA: Diagnosis present

## 2018-08-06 DIAGNOSIS — E871 Hypo-osmolality and hyponatremia: Secondary | ICD-10-CM | POA: Diagnosis not present

## 2018-08-06 DIAGNOSIS — Z794 Long term (current) use of insulin: Secondary | ICD-10-CM

## 2018-08-06 DIAGNOSIS — E785 Hyperlipidemia, unspecified: Secondary | ICD-10-CM | POA: Diagnosis present

## 2018-08-06 DIAGNOSIS — Z888 Allergy status to other drugs, medicaments and biological substances status: Secondary | ICD-10-CM

## 2018-08-06 DIAGNOSIS — I5042 Chronic combined systolic (congestive) and diastolic (congestive) heart failure: Secondary | ICD-10-CM | POA: Diagnosis present

## 2018-08-06 DIAGNOSIS — Z7982 Long term (current) use of aspirin: Secondary | ICD-10-CM

## 2018-08-06 DIAGNOSIS — F17210 Nicotine dependence, cigarettes, uncomplicated: Secondary | ICD-10-CM | POA: Diagnosis present

## 2018-08-06 DIAGNOSIS — Z809 Family history of malignant neoplasm, unspecified: Secondary | ICD-10-CM

## 2018-08-06 DIAGNOSIS — E0801 Diabetes mellitus due to underlying condition with hyperosmolarity with coma: Secondary | ICD-10-CM

## 2018-08-06 DIAGNOSIS — H3530 Unspecified macular degeneration: Secondary | ICD-10-CM | POA: Diagnosis present

## 2018-08-06 LAB — COMPREHENSIVE METABOLIC PANEL
ALT: 11 U/L (ref 0–44)
AST: 16 U/L (ref 15–41)
Albumin: 3.1 g/dL — ABNORMAL LOW (ref 3.5–5.0)
Alkaline Phosphatase: 53 U/L (ref 38–126)
Anion gap: 10 (ref 5–15)
BUN: 18 mg/dL (ref 8–23)
CO2: 24 mmol/L (ref 22–32)
Calcium: 9 mg/dL (ref 8.9–10.3)
Chloride: 99 mmol/L (ref 98–111)
Creatinine, Ser: 1.01 mg/dL — ABNORMAL HIGH (ref 0.44–1.00)
GFR calc Af Amer: >60 mL/min (ref >60)
GFR calc non Af Amer: 53 mL/min — ABNORMAL LOW (ref >60)
Glucose, Bld: 89 mg/dL (ref 70–99)
Potassium: 3.5 mmol/L (ref 3.5–5.1)
Sodium: 133 mmol/L — ABNORMAL LOW (ref 135–145)
Total Bilirubin: 0.9 mg/dL (ref 0.3–1.2)
Total Protein: 6.9 g/dL (ref 6.5–8.1)

## 2018-08-06 LAB — URINALYSIS, ROUTINE W REFLEX MICROSCOPIC
Bacteria, UA: NONE SEEN
Bilirubin Urine: NEGATIVE
Glucose, UA: NEGATIVE mg/dL
Hgb urine dipstick: NEGATIVE
Ketones, ur: NEGATIVE mg/dL
Nitrite: NEGATIVE
Protein, ur: NEGATIVE mg/dL
Specific Gravity, Urine: 1.015 (ref 1.005–1.030)
pH: 6 (ref 5.0–8.0)

## 2018-08-06 LAB — CBC WITH DIFFERENTIAL/PLATELET
Abs Immature Granulocytes: 0.03 10*3/uL (ref 0.00–0.07)
Basophils Absolute: 0 10*3/uL (ref 0.0–0.1)
Basophils Relative: 0 %
Eosinophils Absolute: 0.1 10*3/uL (ref 0.0–0.5)
Eosinophils Relative: 1 %
HCT: 41.9 % (ref 36.0–46.0)
Hemoglobin: 13.4 g/dL (ref 12.0–15.0)
Immature Granulocytes: 0 %
Lymphocytes Relative: 15 %
Lymphs Abs: 1.5 10*3/uL (ref 0.7–4.0)
MCH: 28.3 pg (ref 26.0–34.0)
MCHC: 32 g/dL (ref 30.0–36.0)
MCV: 88.6 fL (ref 80.0–100.0)
Monocytes Absolute: 1.1 10*3/uL — ABNORMAL HIGH (ref 0.1–1.0)
Monocytes Relative: 10 %
Neutro Abs: 7.5 10*3/uL (ref 1.7–7.7)
Neutrophils Relative %: 74 %
Platelets: 249 10*3/uL (ref 150–400)
RBC: 4.73 MIL/uL (ref 3.87–5.11)
RDW: 15.6 % — ABNORMAL HIGH (ref 11.5–15.5)
WBC: 10.2 10*3/uL (ref 4.0–10.5)
nRBC: 0 % (ref 0.0–0.2)

## 2018-08-06 LAB — SARS CORONAVIRUS 2: SARS Coronavirus 2: NOT DETECTED

## 2018-08-06 LAB — TROPONIN I: Troponin I: <0.03 ng/mL (ref <0.03)

## 2018-08-06 LAB — GLUCOSE, CAPILLARY
Glucose-Capillary: 49 mg/dL — ABNORMAL LOW (ref 70–99)
Glucose-Capillary: 96 mg/dL (ref 70–99)

## 2018-08-06 LAB — LIPASE, BLOOD: Lipase: 30 U/L (ref 11–51)

## 2018-08-06 MED ORDER — ATORVASTATIN CALCIUM 40 MG PO TABS
40.00 | ORAL_TABLET | ORAL | Status: DC
Start: 2018-08-05 — End: 2018-08-06

## 2018-08-06 MED ORDER — PHENYLEPH-POT GUAIACOLSULF
81.00 | Status: DC
Start: 2018-08-06 — End: 2018-08-06

## 2018-08-06 MED ORDER — SODIUM CHLORIDE 0.9 % IV BOLUS
1000.0000 mL | Freq: Once | INTRAVENOUS | Status: AC
  Administered 2018-08-06: 1000 mL via INTRAVENOUS

## 2018-08-06 MED ORDER — ACETAMINOPHEN 500 MG PO TABS
500.0000 mg | ORAL_TABLET | Freq: Four times a day (QID) | ORAL | Status: DC | PRN
  Administered 2018-08-08: 500 mg via ORAL
  Filled 2018-08-06: qty 1

## 2018-08-06 MED ORDER — OXYCODONE HCL 5 MG PO TABS
5.00 | ORAL_TABLET | ORAL | Status: DC
Start: ? — End: 2018-08-06

## 2018-08-06 MED ORDER — SODIUM CHLORIDE 0.9 % IV SOLN
10.00 | INTRAVENOUS | Status: DC
Start: ? — End: 2018-08-06

## 2018-08-06 MED ORDER — BARO-CAT PO
50.00 | ORAL | Status: DC
Start: ? — End: 2018-08-06

## 2018-08-06 MED ORDER — INSULIN ASPART 100 UNIT/ML ~~LOC~~ SOLN
0.0000 [IU] | Freq: Three times a day (TID) | SUBCUTANEOUS | Status: DC
  Administered 2018-08-07 (×2): 2 [IU] via SUBCUTANEOUS
  Administered 2018-08-08 (×2): 1 [IU] via SUBCUTANEOUS

## 2018-08-06 MED ORDER — NITROGLYCERIN 0.4 MG SL SUBL
0.40 | SUBLINGUAL_TABLET | SUBLINGUAL | Status: DC
Start: ? — End: 2018-08-06

## 2018-08-06 MED ORDER — ACETAMINOPHEN 500 MG PO TABS
500.00 | ORAL_TABLET | ORAL | Status: DC
Start: ? — End: 2018-08-06

## 2018-08-06 MED ORDER — HUGO ROLLING WALKER BASIC MISC
5.00 | Status: DC
Start: 2018-08-06 — End: 2018-08-06

## 2018-08-06 MED ORDER — LOSARTAN POTASSIUM 50 MG PO TABS
50.0000 mg | ORAL_TABLET | Freq: Two times a day (BID) | ORAL | Status: DC
  Administered 2018-08-06 – 2018-08-09 (×6): 50 mg via ORAL
  Filled 2018-08-06 (×6): qty 1

## 2018-08-06 MED ORDER — DIGEX PO CAPS
500.00 | ORAL_CAPSULE | ORAL | Status: DC
Start: 2018-08-05 — End: 2018-08-06

## 2018-08-06 MED ORDER — HYDROCODONE-ACETAMINOPHEN 5-325 MG PO TABS
1.0000 | ORAL_TABLET | ORAL | Status: DC | PRN
  Administered 2018-08-07: 2 via ORAL
  Administered 2018-08-09: 07:00:00 1 via ORAL
  Filled 2018-08-06: qty 1
  Filled 2018-08-06: qty 2

## 2018-08-06 MED ORDER — HYDRALAZINE HCL 20 MG/ML IJ SOLN
10.00 | INTRAMUSCULAR | Status: DC
Start: ? — End: 2018-08-06

## 2018-08-06 MED ORDER — INSULIN ASPART 100 UNIT/ML ~~LOC~~ SOLN: 0.0000 [IU] | SUBCUTANEOUS | Status: DC

## 2018-08-06 MED ORDER — CALCIUM CITRATE PO
50.00 | ORAL | Status: DC
Start: 2018-08-05 — End: 2018-08-06

## 2018-08-06 MED ORDER — GENERIC EXTERNAL MEDICATION
Status: DC
Start: ? — End: 2018-08-06

## 2018-08-06 MED ORDER — ONDANSETRON HCL 4 MG/2ML IJ SOLN: 4.0000 mg | Freq: Four times a day (QID) | INTRAMUSCULAR | Status: DC | PRN

## 2018-08-06 MED ORDER — CARVEDILOL 6.25 MG PO TABS
6.25 | ORAL_TABLET | ORAL | Status: DC
Start: ? — End: 2018-08-06

## 2018-08-06 MED ORDER — POLYETHYLENE GLYCOL 3350 17 G PO PACK
17.0000 g | PACK | Freq: Every day | ORAL | Status: DC
  Administered 2018-08-06 – 2018-08-09 (×4): 17 g via ORAL
  Filled 2018-08-06 (×4): qty 1

## 2018-08-06 MED ORDER — CARVEDILOL 12.5 MG PO TABS
6.2500 mg | ORAL_TABLET | Freq: Two times a day (BID) | ORAL | Status: DC
  Administered 2018-08-06 – 2018-08-08 (×5): 6.25 mg via ORAL
  Administered 2018-08-09: 09:00:00 via ORAL
  Filled 2018-08-06 (×6): qty 1

## 2018-08-06 MED ORDER — ONDANSETRON HCL 4 MG PO TABS: 4.0000 mg | ORAL_TABLET | Freq: Four times a day (QID) | ORAL | Status: DC | PRN

## 2018-08-06 MED ORDER — ATORVASTATIN CALCIUM 40 MG PO TABS
40.0000 mg | ORAL_TABLET | Freq: Every day | ORAL | Status: DC
  Administered 2018-08-06 – 2018-08-09 (×4): 40 mg via ORAL
  Filled 2018-08-06 (×4): qty 1

## 2018-08-06 MED ORDER — MORPHINE SULFATE (PF) 4 MG/ML IV SOLN
4.0000 mg | Freq: Once | INTRAVENOUS | Status: AC
  Administered 2018-08-06: 4 mg via INTRAVENOUS
  Filled 2018-08-06: qty 1

## 2018-08-06 MED ORDER — FINASTERIDE 5 MG PO TABS
5.0000 mg | ORAL_TABLET | Freq: Every day | ORAL | Status: DC
  Administered 2018-08-06 – 2018-08-09 (×4): 5 mg via ORAL
  Filled 2018-08-06 (×4): qty 1

## 2018-08-06 MED ORDER — CVS EAR DROPS OT
40.00 | OTIC | Status: DC
Start: 2018-08-06 — End: 2018-08-06

## 2018-08-06 MED ORDER — PROSIGHT PO TABS
2.0000 | ORAL_TABLET | Freq: Every day | ORAL | Status: DC
  Administered 2018-08-06 – 2018-08-09 (×4): 2 via ORAL
  Filled 2018-08-06 (×4): qty 2
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
## 2018-08-06 NOTE — ED Notes (Signed)
Returned from CT.

## 2018-08-06 NOTE — ED Notes (Signed)
Bill (ortho tech); aware of pt need of TLSO brace. Notified of pt. Room assignment

## 2018-08-06 NOTE — Telephone Encounter (Addendum)
I received a note from her dermatologist at [facility].  He wants her to use a drug called Humira for her hidradenitis.  This may be problematic with congestive heart failure.  I would like to get a follow up echocardiogram on her.  It was supposed to be scheduled for next month.  But, I would like to go ahead and arrange it in the next couple of weeks. Limited echo to check EF is ok.  I have placed the order in Epic. Patient is currently in the hospital.  Recommend waiting until next week to call to arrange Echo. Richardson Dopp, PA-C    08/06/2018 9:47 PM

## 2018-08-06 NOTE — ED Triage Notes (Signed)
Pt reports being seen at [facility] recently for back pain. Now has increase in generalized weakness, no appetite, weight loss, dizziness and back pain.

## 2018-08-06 NOTE — ED Notes (Signed)
Patient transported to CT 
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
## 2018-08-07 DIAGNOSIS — E785 Hyperlipidemia, unspecified: Secondary | ICD-10-CM | POA: Diagnosis present

## 2018-08-07 DIAGNOSIS — I110 Hypertensive heart disease with heart failure: Secondary | ICD-10-CM | POA: Diagnosis present

## 2018-08-07 DIAGNOSIS — H3530 Unspecified macular degeneration: Secondary | ICD-10-CM | POA: Diagnosis present

## 2018-08-07 DIAGNOSIS — I5042 Chronic combined systolic (congestive) and diastolic (congestive) heart failure: Secondary | ICD-10-CM

## 2018-08-07 DIAGNOSIS — E0801 Diabetes mellitus due to underlying condition with hyperosmolarity with coma: Secondary | ICD-10-CM | POA: Diagnosis not present

## 2018-08-07 DIAGNOSIS — Z881 Allergy status to other antibiotic agents status: Secondary | ICD-10-CM | POA: Diagnosis not present

## 2018-08-07 DIAGNOSIS — I10 Essential (primary) hypertension: Secondary | ICD-10-CM

## 2018-08-07 DIAGNOSIS — Z20828 Contact with and (suspected) exposure to other viral communicable diseases: Secondary | ICD-10-CM | POA: Diagnosis present

## 2018-08-07 DIAGNOSIS — Z888 Allergy status to other drugs, medicaments and biological substances status: Secondary | ICD-10-CM | POA: Diagnosis not present

## 2018-08-07 DIAGNOSIS — Z7982 Long term (current) use of aspirin: Secondary | ICD-10-CM | POA: Diagnosis not present

## 2018-08-07 DIAGNOSIS — S32010A Wedge compression fracture of first lumbar vertebra, initial encounter for closed fracture: Principal | ICD-10-CM

## 2018-08-07 DIAGNOSIS — Z8673 Personal history of transient ischemic attack (TIA), and cerebral infarction without residual deficits: Secondary | ICD-10-CM | POA: Diagnosis not present

## 2018-08-07 DIAGNOSIS — K219 Gastro-esophageal reflux disease without esophagitis: Secondary | ICD-10-CM | POA: Diagnosis present

## 2018-08-07 DIAGNOSIS — Z8042 Family history of malignant neoplasm of prostate: Secondary | ICD-10-CM | POA: Diagnosis not present

## 2018-08-07 DIAGNOSIS — E871 Hypo-osmolality and hyponatremia: Secondary | ICD-10-CM | POA: Diagnosis not present

## 2018-08-07 DIAGNOSIS — E119 Type 2 diabetes mellitus without complications: Secondary | ICD-10-CM | POA: Diagnosis present

## 2018-08-07 DIAGNOSIS — F17210 Nicotine dependence, cigarettes, uncomplicated: Secondary | ICD-10-CM | POA: Diagnosis present

## 2018-08-07 DIAGNOSIS — W07XXXA Fall from chair, initial encounter: Secondary | ICD-10-CM | POA: Diagnosis present

## 2018-08-07 DIAGNOSIS — Z794 Long term (current) use of insulin: Secondary | ICD-10-CM

## 2018-08-07 DIAGNOSIS — Z809 Family history of malignant neoplasm, unspecified: Secondary | ICD-10-CM | POA: Diagnosis not present

## 2018-08-07 LAB — GLUCOSE, CAPILLARY
Glucose-Capillary: 170 mg/dL — ABNORMAL HIGH (ref 70–99)
Glucose-Capillary: 152 mg/dL — ABNORMAL HIGH (ref 70–99)
Glucose-Capillary: 94 mg/dL (ref 70–99)
Glucose-Capillary: 129 mg/dL — ABNORMAL HIGH (ref 70–99)

## 2018-08-07 MED ORDER — HYDRALAZINE HCL 20 MG/ML IJ SOLN
5.0000 mg | Freq: Once | INTRAMUSCULAR | Status: AC
  Administered 2018-08-07: 5 mg via INTRAVENOUS
  Filled 2018-08-07: qty 1

## 2018-08-07 NOTE — Progress Notes (Signed)
Occupational Therapy Evaluation Patient Details Name: Melinda Eaton MRN: [id number] DOB: 1940-02-26 Today's Date: 08/07/2018    History of Present Illness Patient is a 78 y/o female who presents with persistent HA and nausea. 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 PMH includes CVA, HTN, Hepatitis, macular degeneration, anxiety.    Clinical Impression   PTA, pt was living at home with intermittent assistance from son and daughter-in-law for ADL/IADL and functional mobility at RW level. Pt currently requires modA for LB dressing and donning back brace while seated EOB. Pt requires minguard-minA with functional mobility for adherence to back precautions. Began education on AE to assist with ADL. Due to decline in current level of function and limited available support at home, pt would benefit from acute OT to address established goals to facilitate safe D/C to venue listed below. At this time, recommend SNF follow-up. Will continue to follow acutely.     Follow Up Recommendations  Other (comment);Supervision/Assistance - 24 hour;SNF(aide)    Equipment Recommendations  None recommended by OT    Recommendations for Other Services       Precautions / Restrictions Precautions Precautions: Fall;Back Precaution Booklet Issued: No Precaution Comments: reviewed back precautions with pt Required Braces or Orthoses: Spinal Brace Spinal Brace: Thoracolumbosacral orthotic Restrictions Weight Bearing Restrictions: No      Mobility Bed Mobility Overal bed mobility: Needs Assistance Bed Mobility: Rolling;Sidelying to Sit Rolling: Min assist Sidelying to sit: Min assist       General bed mobility comments: pt impulsive to sit upright in bed;multimodal cue and minA required for pt to use log rolling technique  Transfers Overall transfer level: Needs assistance Equipment used: Rolling walker (2 wheeled) Transfers: Sit to/from Stand Sit to Stand: Min  assist;Min guard         General transfer comment: minguard-minA for adherence to back precautions and safety, vc for safe hand placement    Balance Overall balance assessment: Needs assistance;History of Falls Sitting-balance support: Feet supported;No upper extremity supported Sitting balance-Leahy Scale: Fair     Standing balance support: During functional activity;Bilateral upper extremity supported Standing balance-Leahy Scale: Poor Standing balance comment: reliant on at least 1 UE support                           ADL either performed or assessed with clinical judgement   ADL Overall ADL's : Needs assistance/impaired Eating/Feeding: Set up;Sitting   Grooming: Standing;Min guard Grooming Details (indicate cue type and reason): minA for adherence to back precautions Upper Body Bathing: Min guard;Sitting   Lower Body Bathing: Min guard;Sit to/from stand Lower Body Bathing Details (indicate cue type and reason): seated to wash feet, increased time and effort  Upper Body Dressing : Moderate assistance;Sitting Upper Body Dressing Details (indicate cue type and reason): modA to don back brace after demonstration Lower Body Dressing: Sit to/from stand;Moderate assistance Lower Body Dressing Details (indicate cue type and reason): modA to don socks, pants, and briefs minguard for sit<>stand Toilet Transfer: Ambulation;RW;Min guard Armed forces technical officer Details (indicate cue type and reason): for safety  Toileting- Clothing Manipulation and Hygiene: Sit to/from stand;Min guard Toileting - Clothing Manipulation Details (indicate cue type and reason): pt completed posterior pericare with minguard for stability in standing;     Functional mobility during ADLs: Min guard;Minimal assistance;Rolling walker General ADL Comments: pt with decreased activity tolerance;minguard-minA for safety and ahderence to back precautions during ADL     Vision Baseline  Vision/History: Wears  glasses Wears Glasses: At all times Patient Visual Report: No change from baseline Vision Assessment?: No apparent visual deficits     Perception     Praxis      Pertinent Vitals/Pain Pain Assessment: No/denies pain Pain Intervention(s): Monitored during session     Hand Dominance Right   Extremity/Trunk Assessment Upper Extremity Assessment Upper Extremity Assessment: Generalized weakness   Lower Extremity Assessment Lower Extremity Assessment: Defer to PT evaluation   Cervical / Trunk Assessment Cervical / Trunk Assessment: Kyphotic   Communication Communication Communication: No difficulties   Cognition Arousal/Alertness: Awake/alert Behavior During Therapy: WFL for tasks assessed/performed Overall Cognitive Status: History of cognitive impairments - at baseline                                 General Comments: per chart, pt has hx of short term memory loss;requires increased time processing;able to follow multistep commands consistently    General Comments  VSS throughout;educated pt on proper wear schedule for back brace and adherence to back precautions;noted pelvic area with hydradenitis suppurativa appeared to be irritated, nsg notified    Exercises     Shoulder Instructions      Home Living Family/patient expects to be discharged to:: Private residence Living Arrangements: Children Available Help at Discharge: Family;Available PRN/intermittently Type of Home: House Home Access: Stairs to enter [employer] of Steps: 2 Entrance Stairs-Rails: None Home Layout: One level     Bathroom Shower/Tub: Teacher, early years/pre: Handicapped height Bathroom Accessibility: Yes How Accessible: Accessible via walker Home Equipment: Shower seat      Lives With: Son    Prior Functioning/Environment Level of Independence: Needs assistance  Gait / Transfers Assistance Needed: pt was using RW  ADL's / Homemaking Assistance  Needed: daughter in law assists with medication prep and cooking   Comments: pt reports she is alone most of the day, able to have assistance from 3pm-night time        OT Problem List: Decreased strength;Decreased activity tolerance;Impaired balance (sitting and/or standing);Decreased safety awareness;Decreased knowledge of use of DME or AE;Decreased knowledge of precautions;Decreased range of motion      OT Treatment/Interventions: Self-care/ADL training;Therapeutic exercise;Neuromuscular education;Energy conservation;Therapeutic activities;Patient/family education;Balance training    OT Goals(Current goals can be found in the care plan section) Acute Rehab OT Goals Patient Stated Goal: to go home OT Goal Formulation: With patient Time For Goal Achievement: 07/01/18 Potential to Achieve Goals: Good ADL Goals Pt Will Perform Grooming: with modified independence;standing Pt Will Perform Upper Body Dressing: with modified independence;sitting Pt Will Perform Lower Body Dressing: with modified independence;with adaptive equipment;sit to/from stand Pt Will Transfer to Toilet: with modified independence;ambulating Additional ADL Goal #1: Pt will independently verbalize proper wear schedule for back brace.  OT Frequency: Min 2X/week   Barriers to D/C: Decreased caregiver support  family is working during day       Co-evaluation              AM-PAC OT "6 Clicks" Daily Activity     Outcome Measure Help from another person eating meals?: None Help from another person taking care of personal grooming?: A Little Help from another person toileting, which includes using toliet, bedpan, or urinal?: A Little Help from another person bathing (including washing, rinsing, drying)?: A Little Help from another person to put on and taking off regular upper body clothing?: A Little Help from another  person to put on and taking off regular lower body clothing?: A Little 6 Click Score: 19    End of Session Equipment Utilized During Treatment: Rolling walker;Back brace Nurse Communication: Mobility status;Other (comment)(pt requesting to clean hydradenitis area)  Activity Tolerance: Patient tolerated treatment well Patient left: with call bell/phone within reach;in chair;with chair alarm set;Other (comment)(with pharmacy in room)  OT Visit Diagnosis: Unsteadiness on feet (R26.81);Muscle weakness (generalized) (M62.81);History of falling (Z91.81);Other abnormalities of gait and mobility (R26.89)                Time: 0950-1004 OT Time Calculation (min): 14 min Charges:  OT General Charges $OT Visit: 1 Visit OT Evaluation $OT Eval Moderate Complexity: [place] OTR/L Acute Rehabilitation Services Office: [place] 08/07/2018, 10:39 AM

## 2018-08-07 NOTE — Progress Notes (Addendum)
Physical Therapy Evaluation Patient Details Name: Melinda Eaton MRN: [id number] DOB: 1941-02-10 Today's Date: 08/07/2018   History of Present Illness  Patient is a 78 y/o female who presents with persistent HA and nausea. 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 PMH includes CVA, HTN, Hepatitis, macular degeneration, anxiety.   Clinical Impression  Patient presented with decreased ednurance with gait and decreased overall mobility. She was only able to ambualte 20' She will be home alone for most of the day. She requires close gaurding. She may benefit from rehab at a SNF if she can not be provided with 24 hour assistance at home. Skilled acute therapy will continue to work with the patient.     Follow Up Recommendations Addendum: rehab at SNF/ 24 hour assist.  ( Home health entered in error. Clinical impression statement clearly states rehab at [facility])     Equipment Recommendations      Recommendations for Other Services Rehab consult     Precautions / Restrictions Precautions Precautions: Fall;Back Precaution Booklet Issued: No Precaution Comments: reviewed back precautions with pt Required Braces or Orthoses: Spinal Brace Spinal Brace: Thoracolumbosacral orthotic Restrictions Weight Bearing Restrictions: No      Mobility  Bed Mobility Overal bed mobility: Needs Assistance Bed Mobility: Rolling;Sidelying to Sit Rolling: Min assist Sidelying to sit: Min assist Supine to sit: Supervision Sit to supine: Supervision   General bed mobility comments: Patient sat upright depite cuing. She was advidsed to roll on her side then sit up. While edge of the bed the patient became nauseated byut that improved.   Transfers Overall transfer level: Needs assistance Equipment used: Rolling walker (2 wheeled) Transfers: Sit to/from Stand Sit to Stand: Min assist;Min guard         General transfer comment: minguard-minA for adherence to back  precautions and safety, vc for safe hand placement  Ambulation/Gait Ambulation/Gait assistance: Min guard Gait Distance (Feet): 20 Feet Assistive device: Rolling walker (2 wheeled) Gait Pattern/deviations: Step-through pattern;Decreased stride length Gait velocity: decreased Gait velocity interpretation: <1.31 ft/sec, indicative of household ambulator General Gait Details: slow but steady gait. No LOB.   Stairs            Wheelchair Mobility    Modified Rankin (Stroke Patients Only)       Balance Overall balance assessment: Needs assistance;History of Falls Sitting-balance support: Feet supported;No upper extremity supported Sitting balance-Leahy Scale: Fair     Standing balance support: During functional activity;Bilateral upper extremity supported Standing balance-Leahy Scale: Poor Standing balance comment: reliant on walker                              Pertinent Vitals/Pain Pain Assessment: No/denies pain Pain Intervention(s): Monitored during session;Premedicated before session    [place] expects to be discharged to:: Private residence Living Arrangements: Children Available Help at Discharge: Family;Available PRN/intermittently Type of Home: House Home Access: Stairs to enter Entrance Stairs-Rails: None Entrance Stairs-Number of Steps: 2 Home Layout: One level Home Equipment: Shower seat      Prior Function Level of Independence: Needs assistance   Gait / Transfers Assistance Needed: pt was using  a rtollator walker with a seat   ADL's / Homemaking Assistance Needed: daughter in law assists with medication prep and cooking  Comments: pt reports she is alone most of the day, able to have assistance from 3pm-night time.      Hand Dominance   Dominant  Hand: Right    Extremity/Trunk Assessment   Upper Extremity Assessment Upper Extremity Assessment: Generalized weakness    Lower Extremity Assessment Lower  Extremity Assessment: Defer to PT evaluation    Cervical / Trunk Assessment Cervical / Trunk Assessment: Kyphotic  Communication   Communication: No difficulties  Cognition Arousal/Alertness: Awake/alert Behavior During Therapy: WFL for tasks assessed/performed Overall Cognitive Status: History of cognitive impairments - at baseline                                 General Comments: per chart, pt has hx of short term memory loss;requires increased time processing;able to follow multistep commands consistently       General Comments General comments (skin integrity, edema, etc.): VSS throughout;educated pt on proper wear schedule for back brace and adherence to back precautions;noted pelvic area with hydradenitis suppurativa appeared to be irritated, nsg notified    Exercises     Assessment/Plan    PT Assessment Patient needs continued PT services  PT Problem List Decreased balance;Pain;Cardiopulmonary status limiting activity;Decreased knowledge of use of DME;Decreased mobility;Decreased activity tolerance       PT Treatment Interventions DME instruction;Functional mobility training;Balance training;Patient/family education;Therapeutic activities;Gait training;Therapeutic exercise;Neuromuscular re-education    PT Goals (Current goals can be found in the Care Plan section)  Acute Rehab PT Goals Patient Stated Goal: to go home PT Goal Formulation: With patient Time For Goal Achievement: 06/30/18 Potential to Achieve Goals: Good    Frequency Min 3X/week   Barriers to discharge Decreased caregiver support      Co-evaluation               AM-PAC PT "6 Clicks" Mobility  Outcome Measure Help needed turning from your back to your side while in a flat bed without using bedrails?: A Little Help needed moving from lying on your back to sitting on the side of a flat bed without using bedrails?: A Little Help needed moving to and from a bed to a chair (including  a wheelchair)?: A Lot Help needed standing up from a chair using your arms (e.g., wheelchair or bedside chair)?: A Lot Help needed to walk in hospital room?: A Lot Help needed climbing 3-5 steps with a railing? : A Lot 6 Click Score: 14    End of Session Equipment Utilized During Treatment: Gait belt Activity Tolerance: Patient limited by pain Patient left: in bed;with call bell/phone within reach;with bed alarm set Nurse Communication: Mobility status;Other (comment) PT Visit Diagnosis: Unsteadiness on feet (R26.81);Difficulty in walking, not elsewhere classified (R26.2) Pain - part of body: (back)    Time: 4196-2229 PT Time Calculation (min) (ACUTE ONLY): 15 min   Charges:   PT Evaluation $PT Eval Moderate Complexity: 1 Mod           Carney Living PT DPT  08/07/2018, 11:38 AM

## 2018-08-07 NOTE — TOC Initial Note (Signed)
Transition of Care [facility]) - Initial/Assessment Note    Patient Details  Name: Melinda Eaton MRN: [id number] Date of Birth: [birth year]  Transition of Care [facility] CM/SW Contact:    Geralynn Ochs, LCSW Phone Number: 08/07/2018, 5:19 PM  Clinical Narrative:   CSW alerted by RN that patient's daughter in law, Dorian Pod, would like CSW to call. CSW spoke with Dorian Pod, and they have been working with Alvis Lemmings to place the patient in a facility. Dorian Pod says that [facility] choice, and that they had been working on getting her there while her Medicaid is pending. CSW reached out to [facility], and they were not familiar with patient's name. CSW faxed referral to [facility], awaiting response. CSW to follow.               Expected Discharge Plan: Skilled Nursing Facility Barriers to Discharge: Continued Medical Work up, Ship broker   Patient Goals and CMS Choice   CMS Medicare.gov Compare Post Acute Care list provided to:: Patient Represenative (must comment) Choice offered to / list presented to : Adult Children  Expected Discharge Plan and Services Expected Discharge Plan: [place] Acute Care Choice: [place] arrangements for the past 2 months: Single Family Home                                      Prior Living Arrangements/Services Living arrangements for the past 2 months: Single Family Home Lives with:: Self, Adult Children Patient language and need for interpreter reviewed:: No Do you feel safe going back to the place where you live?: Yes      Need for Family Participation in Patient Care: Yes (Comment) Care giver support system in place?: No (comment)   Criminal Activity/Legal Involvement Pertinent to Current Situation/Hospitalization: No - Comment as needed  Activities of Daily Living      Permission Sought/Granted Permission sought to share information with : Facility Automotive engineer, Family Supports Permission granted to share information with : Yes, Verbal Permission Granted  Share Information with NAME: Dorian Pod  Permission granted to share info w AGENCY: SNF  Permission granted to share info w Relationship: Daughter in law     Emotional Assessment   Attitude/Demeanor/Rapport: Unable to Assess Affect (typically observed): Unable to Assess Orientation: : Oriented to Self, Oriented to Place, Oriented to  Time, Oriented to Situation Alcohol / Substance Use: Not Applicable Psych Involvement: No (comment)  Admission diagnosis:  Stress fracture of lumbar vertebra, initial encounter [M48.46XA] Closed compression fracture of L1 vertebra ([place]) [S32.010A] Patient Active Problem List   Diagnosis Date Noted  . Closed compression fracture of L1 vertebra ([place]) 08/06/2018  . Chronic combined systolic and diastolic heart failure ([place]) 07/04/2018  . Hyperlipidemia 06/19/2018  . Diabetes ([place]) 06/19/2018  . Hyponatremia 06/19/2018  . Anemia 06/19/2018  . Pressure injury of skin 06/17/2018  . IVH (intraventricular hemorrhage) ([place]) R lateral ventricle 06/16/2018  . UTI (urinary tract infection) 12/18/2017  . Heme positive stool 12/17/2017  . Hypertension 12/17/2017  . Symptomatic stenosis of right carotid artery 01/27/2016  . 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) 01/22/2016  . Tobacco abuse 12/29/2015  . Hidradenitis suppurativa 03/29/2013   PCP:  Christain Sacramento, MD Pharmacy:   [facility] [address] - [place], [place] [place] [address]  Phone: [phone] Fax: [place], [place] [address] Phone: [phone] Fax: [phone]     Social Determinants of Health (SDOH) Interventions    Readmission Risk Interventions Readmission Risk Prevention Plan 06/18/2018  Transportation Screening  Complete  PCP or Specialist Appt within 5-7 Days Complete  Home Care Screening Complete  Medication Review (RN CM) Complete  Some recent data might be hidden

## 2018-08-07 NOTE — Progress Notes (Addendum)
PROGRESS NOTE    Melinda Eaton  YBO:[id number] DOB: 10/26/1940 DOA: 08/06/2018 PCP: Christain Sacramento, MD   Brief Narrative:  78 year old female with history of diabetes mellitus, hypertension, CVA, intracranial hemorrhage in April 2020 experienced shaking and fell out of the chair on 08/04/2018.  Since then patient was experiencing severe back pain concerning this patient is brought to the emergency department.  Work-up in the emergency department with CT of the lumbar spine showed L1 compression fracture with 50% height loss, no retropulsion.  Patient is evaluated by neurosurgery, recommended TLSO brace and rehab.  Urine analysis is negative.  No obvious signs of infection are noted.  Patient is afebrile in the emergency department.  Elevated by physical therapy with a TLSO brace, recommended rehab at SNF.  Neurosurgery recommended to follow-up in the hospital to further evaluate any need for the surgery.   Assessment & Plan:   Principal Problem:   Closed compression fracture of L1 vertebra (HCC) Active Problems:   Hypertension   Diabetes ([place])   Chronic combined systolic and diastolic heart failure (HCC)  ## Closed L1 compression fx with 50% height loss -TLSO brace -Appreciate neurosurgery evaluation, recommended to continue to follow-up with therapy with a TLSO brace -Evaluated by PT, recommended SNF if patient does not have assistance at home.  Patient states lives with her daughter and son-in-law, both of them work. -Kyphoplasty will be decided by neurosurgery Pt is not a safe discharge at this time  ##DM2 - 1. Hold home PO meds 2. Sensitive SSI AC  ## HTN - 3. Continue coreg and losartan  ##Shaking episode - pt is apoor historian. States had shaking episodes X2 in the last 2 weeks. No signs of infection on UA or CHR, afebrile.  - concern about seizures as pt had recent ICH. Get EEG.   DVT prophylaxis: SCDs  code Status: Full code  family Communication: No family  members are present  disposition Plan: Clinical improvement.   Consultants:   Neurosurgery   Subjective: Some improvement with the back pain from yesterday  Objective: Vitals:   08/07/18 0341 08/07/18 0526 08/07/18 0924 08/07/18 1223  BP: (!) 198/110 135/73 (!) 168/79 (!) 142/75  Pulse: (!) 114 97 94 83  Resp: 16  15 17   Temp: 98.3 F (36.8 C)  98.5 F (36.9 C) 98.2 F (36.8 C)  TempSrc: Oral  Oral Oral  SpO2: 97%  97% 99%  Weight:      Height:        Intake/Output Summary (Last 24 hours) at 08/07/2018 1256 Last data filed at 08/07/2018 0612 Gross per 24 hour  Intake -  Output 1000 ml  Net -1000 ml   Filed Weights   08/06/18 2230  Weight: 58.9 kg    Examination:  General exam: Appears calm and comfortable  Respiratory system: Clear to auscultation. Respiratory effort normal. Cardiovascular system: S1 & S2 heard, RRR. No JVD, murmurs, rubs, gallops or clicks. No pedal edema. Gastrointestinal system: Abdomen is nondistended, soft and nontender. No organomegaly or masses felt. Normal bowel sounds heard. Central nervous system: Alert and oriented. No focal neurological deficits. Extremities: Symmetric 5 x 5 power. Skin: No rashes, lesions or ulcers Psychiatry: Judgement and insight appear normal. Mood & affect appropriate.     Data Reviewed: I have personally reviewed following labs and imaging studies  CBC: Recent Labs  Lab 08/06/18 1756  WBC 10.2  NEUTROABS 7.5  HGB 13.4  HCT 41.9  MCV 88.6  PLT 249  Basic Metabolic Panel: Recent Labs  Lab 08/06/18 1756  NA 133*  K 3.5  CL 99  CO2 24  GLUCOSE 89  BUN 18  CREATININE 1.01*  CALCIUM 9.0   GFR: Estimated Creatinine Clearance: 38 mL/min (A) (by C-G formula based on SCr of 1.01 mg/dL (H)). Liver Function Tests: Recent Labs  Lab 08/06/18 1756  AST 16  ALT 11  ALKPHOS 53  BILITOT 0.9  PROT 6.9  ALBUMIN 3.1*   Recent Labs  Lab 08/06/18 1756  LIPASE 30   No results for input(s):  AMMONIA in the last 168 hours. Coagulation Profile: No results for input(s): INR, PROTIME in the last 168 hours. Cardiac Enzymes: Recent Labs  Lab 08/06/18 1756  TROPONINI <0.03   BNP (last 3 results) No results for input(s): PROBNP in the last 8760 hours. HbA1C: No results for input(s): HGBA1C in the last 72 hours. CBG: Recent Labs  Lab 08/06/18 2238 08/06/18 2315 08/07/18 0610 08/07/18 1225  GLUCAP 49* 96 170* 152*   Lipid Profile: No results for input(s): CHOL, HDL, LDLCALC, TRIG, CHOLHDL, LDLDIRECT in the last 72 hours. Thyroid Function Tests: No results for input(s): TSH, T4TOTAL, FREET4, T3FREE, THYROIDAB in the last 72 hours. Anemia Panel: No results for input(s): VITAMINB12, FOLATE, FERRITIN, TIBC, IRON, RETICCTPCT in the last 72 hours. Sepsis Labs: No results for input(s): PROCALCITON, LATICACIDVEN in the last 168 hours.  Recent Results (from the past 240 hour(s))  SARS Coronavirus 2     Status: None   Collection Time: 08/06/18  9:47 PM  Result Value Ref Range Status   SARS Coronavirus 2 NOT DETECTED NOT DETECTED Final    Comment: (NOTE) SARS-CoV-2 target nucleic acids are NOT DETECTED. The SARS-CoV-2 RNA is generally detectable in upper and lower respiratory specimens during the acute phase of infection.  Negative  results do not preclude SARS-CoV-2 infection, do not rule out co-infections with other pathogens, and should not be used as the sole basis for treatment or other patient management decisions.  Negative results must be combined with clinical observations, patient history, and epidemiological information. The expected result is Not Detected. Fact Sheet for Patients: http://www.biofiredefense.com/wp-content/uploads/2020/03/BIOFIRE-COVID -19-patients.pdf Fact Sheet for Healthcare Providers: http://www.biofiredefense.com/wp-content/uploads/2020/03/BIOFIRE-COVID -19-hcp.pdf This test is not yet approved or cleared by the [place] and  has  been authorized for detection and/or diagnosis of SARS-CoV-2 by FDA under an Emergency Use Authorization (EUA).  This EUA will remain in effec t (meaning this test can be used) for the duration of  the COVID-19 declaration under Section 564(b)(1) of the Act, 21 U.S.C. section 360bbb-3(b)(1), unless the authorization is terminated or revoked sooner. Performed at [facility], [place] [address], [place], [place] [address]          Radiology Studies: Ct Head Wo Contrast  Result Date: 08/06/2018 CLINICAL DATA:  Fall on Friday.  Increase in generalized weakness. EXAM: CT HEAD WITHOUT CONTRAST TECHNIQUE: Contiguous axial images were obtained from the base of the skull through the vertex without intravenous contrast. COMPARISON:  CT head without contrast 06/22/2018 FINDINGS: Brain: Remote right MCA territory encephalomalacia is stable. Moderate atrophy and white matter disease is stable. No acute infarct, hemorrhage, or mass lesion is present. The ventricles are proportionate to the degree of atrophy. Brainstem and cerebellum are normal. Vascular: Atherosclerotic calcifications are present within the cavernous internal carotid arteries bilaterally. There is no hyperdense vessel. Skull: Calvarium is intact. No focal lytic or blastic lesions are present. Sinuses/Orbits: The paranasal sinuses and mastoid air cells  are clear. The globes and orbits are within normal limits. IMPRESSION: 1. No acute intracranial abnormality or significant interval change. 2. Stable remote right MCA territory infarcts. Electronically Signed   By: San Morelle M.D.   On: 08/06/2018 19:07   Ct Lumbar Spine Wo Contrast  Result Date: 08/06/2018 CLINICAL DATA:  Fall 2 days ago. Generalized weakness. L1 compression fracture. Progressive pain. EXAM: CT LUMBAR SPINE WITHOUT CONTRAST TECHNIQUE: Multidetector CT imaging of the lumbar spine was performed without intravenous contrast administration. Multiplanar CT image  reconstructions were also generated. COMPARISON:  None. FINDINGS: Segmentation: 5 non rib-bearing lumbar type vertebral bodies are present. The lowest fully formed vertebral body is L5. Alignment: AP alignment is anatomic. There straightening of the normal lumbar lordosis. No significant curvature is present. Vertebrae: Anterior compression fracture is present at L1. There is involvement of both the superior and inferior endplates. There is 60% loss of height anteriorly compared to the posterior endplate. No other acute fractures are present. Endplate sclerotic changes are present at L5-S1. Paraspinal and other soft tissues: Atherosclerotic calcifications are present in the aorta and branch vessels. There is no aneurysm. No focal solid organ lesions are present. Disc levels: T12-L1: No significant stenosis. L1-2: Mild disc bulging is present. Mild facet hypertrophy is noted bilaterally. There is no significant stenosis. L2-3: Mild disc bulging and facet hypertrophy is present without significant stenosis. L3-4: A far left lateral disc protrusion is present. The disc extends into the left neural foramen. Mild left foraminal stenosis is present. The central canal is patent. L4-5: A broad-based disc protrusion and moderate facet hypertrophy is present. There is moderate central canal narrowing with subarticular narrowing bilaterally. Moderate bilateral foraminal narrowing is also present. L5-S1: Asymmetric left-sided facet hypertrophy is present. There is asymmetric spurring from the endplate. This results in moderate left foraminal stenosis. The central canal is patent. IMPRESSION: 1. Anterior compression fracture at L1 involves the superior and inferior endplates. There is over 50% loss of height. No retropulsed bone is present. 2. No other focal fractures are present. 3. Far left lateral disc protrusion at L3-4 with mild left foraminal stenosis. 4. Moderate central canal stenosis with moderate subarticular and  foraminal stenosis bilaterally at L4-5. 5. Moderate left foraminal stenosis at L5-S1. Electronically Signed   By: San Morelle M.D.   On: 08/06/2018 19:03        Scheduled Meds: . atorvastatin  40 mg Oral Daily  . carvedilol  6.25 mg Oral BID  . finasteride  5 mg Oral Daily  . insulin aspart  0-9 Units Subcutaneous TID WC  . losartan  50 mg Oral BID  . multivitamin  2 tablet Oral Daily  . polyethylene glycol  17 g Oral Daily   Continuous Infusions:   LOS: 0 days    Time spent: 40 min    Alhassan Everingham, MD [facility] Pager [phone]  If 7PM-7AM, please contact night-coverage [email or web address] Password [facility] 08/07/2018, 12:56 PM

## 2018-08-07 NOTE — Progress Notes (Signed)
Pt  Has MASD around entire vaginal area, skin in the area is moist, red, wiping with foul odor. Pt wants to continue wearing pure wick even after nurse educated that the pure wick will cause more skin break down. Pt c/o pain 10/10 in vaginal area. Nurse administered pain medication. MD notifed

## 2018-08-07 NOTE — Progress Notes (Addendum)
Subjective: Patient reports "I hurt some when I move... in my back"  Objective: Vital signs in last 24 hours: Temp:  [97.5 F (36.4 C)-98.3 F (36.8 C)] 98.3 F (36.8 C) (06/15 0341) Pulse Rate:  [87-114] 97 (06/15 0526) Resp:  [15-23] 16 (06/15 0341) BP: (101-198)/(62-110) 135/73 (06/15 0526) SpO2:  [93 %-100 %] 97 % (06/15 0341) Weight:  [58.9 kg] 58.9 kg (06/14 2230)  Intake/Output from previous day: 06/14 0701 - 06/15 0700 In: -  Out: 1000 [Urine:1000] Intake/Output this shift: No intake/output data recorded.  Alert, conversant. MAEW. Good strength BLE. Back pain with core motion.   Lab Results: Recent Labs    08/06/18 1756  WBC 10.2  HGB 13.4  HCT 41.9  PLT 249   BMET Recent Labs    08/06/18 1756  NA 133*  K 3.5  CL 99  CO2 24  GLUCOSE 89  BUN 18  CREATININE 1.01*  CALCIUM 9.0    Studies/Results: Ct Head Wo Contrast  Result Date: 08/06/2018 CLINICAL DATA:  Fall on Friday.  Increase in generalized weakness. EXAM: CT HEAD WITHOUT CONTRAST TECHNIQUE: Contiguous axial images were obtained from the base of the skull through the vertex without intravenous contrast. COMPARISON:  CT head without contrast 06/22/2018 FINDINGS: Brain: Remote right MCA territory encephalomalacia is stable. Moderate atrophy and white matter disease is stable. No acute infarct, hemorrhage, or mass lesion is present. The ventricles are proportionate to the degree of atrophy. Brainstem and cerebellum are normal. Vascular: Atherosclerotic calcifications are present within the cavernous internal carotid arteries bilaterally. There is no hyperdense vessel. Skull: Calvarium is intact. No focal lytic or blastic lesions are present. Sinuses/Orbits: The paranasal sinuses and mastoid air cells are clear. The globes and orbits are within normal limits. IMPRESSION: 1. No acute intracranial abnormality or significant interval change. 2. Stable remote right MCA territory infarcts. Electronically Signed    By: San Morelle M.D.   On: 08/06/2018 19:07   Ct Lumbar Spine Wo Contrast  Result Date: 08/06/2018 CLINICAL DATA:  Fall 2 days ago. Generalized weakness. L1 compression fracture. Progressive pain. EXAM: CT LUMBAR SPINE WITHOUT CONTRAST TECHNIQUE: Multidetector CT imaging of the lumbar spine was performed without intravenous contrast administration. Multiplanar CT image reconstructions were also generated. COMPARISON:  None. FINDINGS: Segmentation: 5 non rib-bearing lumbar type vertebral bodies are present. The lowest fully formed vertebral body is L5. Alignment: AP alignment is anatomic. There straightening of the normal lumbar lordosis. No significant curvature is present. Vertebrae: Anterior compression fracture is present at L1. There is involvement of both the superior and inferior endplates. There is 60% loss of height anteriorly compared to the posterior endplate. No other acute fractures are present. Endplate sclerotic changes are present at L5-S1. Paraspinal and other soft tissues: Atherosclerotic calcifications are present in the aorta and branch vessels. There is no aneurysm. No focal solid organ lesions are present. Disc levels: T12-L1: No significant stenosis. L1-2: Mild disc bulging is present. Mild facet hypertrophy is noted bilaterally. There is no significant stenosis. L2-3: Mild disc bulging and facet hypertrophy is present without significant stenosis. L3-4: A far left lateral disc protrusion is present. The disc extends into the left neural foramen. Mild left foraminal stenosis is present. The central canal is patent. L4-5: A broad-based disc protrusion and moderate facet hypertrophy is present. There is moderate central canal narrowing with subarticular narrowing bilaterally. Moderate bilateral foraminal narrowing is also present. L5-S1: Asymmetric left-sided facet hypertrophy is present. There is asymmetric spurring from the endplate. This  results in moderate left foraminal  stenosis. The central canal is patent. IMPRESSION: 1. Anterior compression fracture at L1 involves the superior and inferior endplates. There is over 50% loss of height. No retropulsed bone is present. 2. No other focal fractures are present. 3. Far left lateral disc protrusion at L3-4 with mild left foraminal stenosis. 4. Moderate central canal stenosis with moderate subarticular and foraminal stenosis bilaterally at L4-5. 5. Moderate left foraminal stenosis at L5-S1. Electronically Signed   By: San Morelle M.D.   On: 08/06/2018 19:03    Assessment/Plan:   LOS: 0 days  TLSO to mobilize.    Verdis Prime 08/07/2018, 8:01 AM   We will see how patient's pain is controlled with bracing.  Hopefully, she will be able to avoid surgical intervention and fracture will heal with time and bracing.

## 2018-08-07 NOTE — NC FL2 (Signed)
[place] LEVEL OF CARE SCREENING TOOL     IDENTIFICATION  Patient Name: Melinda Eaton Birthdate: 1940/07/02 Sex: female Admission Date (Current Location): 08/06/2018  [facility] and [place] Number:  Herbalist and Address:  [facility] [place], [place] [address] [place], [place] [address]      Provider Number: [id number]  Attending Physician Name and Address:  Monica Becton, MD  Relative Name and Phone Number:       Current Level of Care: Hospital Recommended Level of Care: [place] Prior Approval Number:    Date Approved/Denied:   PASRR Number: Manual review  Discharge Plan: SNF    Current Diagnoses: Patient Active Problem List   Diagnosis Date Noted  . Closed compression fracture of L1 vertebra ([place]) 08/06/2018  . Chronic combined systolic and diastolic heart failure ([place]) 07/04/2018  . Hyperlipidemia 06/19/2018  . Diabetes ([place]) 06/19/2018  . Hyponatremia 06/19/2018  . Anemia 06/19/2018  . Pressure injury of skin 06/17/2018  . 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 UTI (urinary tract infection) 12/18/2017  . Heme positive stool 12/17/2017  . Hypertension 12/17/2017  . Symptomatic stenosis of right carotid artery 01/27/2016  . 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 Tobacco abuse 12/29/2015  . Hidradenitis suppurativa 03/29/2013    Orientation RESPIRATION BLADDER Height & Weight     Self, Time, Situation, Place  Normal Incontinent Weight: 129 lb 13.6 oz (58.9 kg) Height:  5\' 3"  (160 cm)  BEHAVIORAL SYMPTOMS/MOOD NEUROLOGICAL BOWEL NUTRITION STATUS      Continent Diet(see DC summary)  AMBULATORY STATUS COMMUNICATION OF NEEDS Skin   Limited Assist Verbally PU Stage and Appropriate Care   PU Stage 2 Dressing: No Dressing                   Personal Care Assistance Level of Assistance  Bathing, Feeding, Dressing Bathing Assistance:  Limited assistance Feeding assistance: Independent Dressing Assistance: Limited assistance     Functional Limitations Info  Sight, Hearing, Speech Sight Info: Impaired Hearing Info: Adequate Speech Info: Adequate    SPECIAL CARE FACTORS FREQUENCY  PT (By licensed PT), OT (By licensed OT)     PT Frequency: 5x/wk OT Frequency: 5x/wk            Contractures Contractures Info: Not present    Additional Factors Info  Code Status, Allergies, Insulin Sliding Scale Code Status Info: Full Allergies Info: Bactrim Sulfamethoxazole-trimethoprim, Metformin And Related, Doxycycline   Insulin Sliding Scale Info: 0-9 units 3x/day with meals       Current Medications (08/07/2018):  This is the current hospital active medication list Current Facility-Administered Medications  Medication Dose Route Frequency Provider Last Rate Last Dose  . acetaminophen (TYLENOL) tablet 500 mg  500 mg Oral Q6H PRN Etta Quill, DO      . atorvastatin (LIPITOR) tablet 40 mg  40 mg Oral Daily Jennette Kettle M, DO   40 mg at 08/07/18 0933  . carvedilol (COREG) tablet 6.25 mg  6.25 mg Oral BID Jennette Kettle M, DO   6.25 mg at 08/07/18 0931  . finasteride (PROSCAR) tablet 5 mg  5 mg Oral Daily Jennette Kettle M, DO   5 mg at 08/07/18 0933  . HYDROcodone-acetaminophen (NORCO/VICODIN) 5-325 MG per tablet 1-2 tablet  1-2 tablet Oral Q4H PRN Etta Quill, DO   2 tablet at 08/07/18 1100  . insulin aspart (novoLOG) injection 0-9 Units  0-9 Units Subcutaneous TID WC Etta Quill, DO   2 Units at 08/07/18 1234  . losartan (COZAAR) tablet 50 mg  50 mg Oral BID Jennette Kettle M, DO   50 mg at 08/07/18 0539  . multivitamin (PROSIGHT) tablet 2 tablet  2 tablet Oral Daily Etta Quill, DO   2 tablet at 08/07/18 0932  . ondansetron (ZOFRAN) tablet 4 mg  4 mg Oral Q6H PRN Etta Quill, DO       Or  . ondansetron [facility]) injection 4 mg  4 mg Intravenous Q6H PRN Etta Quill, DO      . polyethylene  glycol (MIRALAX / GLYCOLAX) packet 17 g  17 g Oral Daily Jennette Kettle M, DO   17 g at 08/07/18 0930     Discharge Medications: Please see discharge summary for a list of discharge medications.  Relevant Imaging Results:  Relevant Lab Results:   Additional Information SS#: [id number]  Geralynn Ochs, LCSW

## 2018-08-08 ENCOUNTER — Other Ambulatory Visit: Payer: Self-pay

## 2018-08-08 LAB — GLUCOSE, CAPILLARY
Glucose-Capillary: 148 mg/dL — ABNORMAL HIGH (ref 70–99)
Glucose-Capillary: 140 mg/dL — ABNORMAL HIGH (ref 70–99)
Glucose-Capillary: 100 mg/dL — ABNORMAL HIGH (ref 70–99)
Glucose-Capillary: 106 mg/dL — ABNORMAL HIGH (ref 70–99)

## 2018-08-08 LAB — URINE CULTURE: Culture: NO GROWTH

## 2018-08-08 NOTE — Progress Notes (Signed)
Subjective: Patient reports doing well  Objective: Vital signs in last 24 hours: Temp:  [97.6 F (36.4 C)-99.5 F (37.5 C)] 98.3 F (36.8 C) (06/16 0740) Pulse Rate:  [76-105] 92 (06/16 0740) Resp:  [14-17] 15 (06/16 0740) BP: (140-168)/(66-82) 166/76 (06/16 0740) SpO2:  [96 %-100 %] 96 % (06/16 0740)  Intake/Output from previous day: 06/15 0701 - 06/16 0700 In: -  Out: 375 [Urine:375] Intake/Output this shift: No intake/output data recorded.  Physical Exam: No numbness or weakness in legs.  Back pain improved.  Lab Results: Recent Labs    08/06/18 1756  WBC 10.2  HGB 13.4  HCT 41.9  PLT 249   BMET Recent Labs    08/06/18 1756  NA 133*  K 3.5  CL 99  CO2 24  GLUCOSE 89  BUN 18  CREATININE 1.01*  CALCIUM 9.0    Studies/Results: Ct Head Wo Contrast  Result Date: 08/06/2018 CLINICAL DATA:  Fall on Friday.  Increase in generalized weakness. EXAM: CT HEAD WITHOUT CONTRAST TECHNIQUE: Contiguous axial images were obtained from the base of the skull through the vertex without intravenous contrast. COMPARISON:  CT head without contrast 06/22/2018 FINDINGS: Brain: Remote right MCA territory encephalomalacia is stable. Moderate atrophy and white matter disease is stable. No acute infarct, hemorrhage, or mass lesion is present. The ventricles are proportionate to the degree of atrophy. Brainstem and cerebellum are normal. Vascular: Atherosclerotic calcifications are present within the cavernous internal carotid arteries bilaterally. There is no hyperdense vessel. Skull: Calvarium is intact. No focal lytic or blastic lesions are present. Sinuses/Orbits: The paranasal sinuses and mastoid air cells are clear. The globes and orbits are within normal limits. IMPRESSION: 1. No acute intracranial abnormality or significant interval change. 2. Stable remote right MCA territory infarcts. Electronically Signed   By: San Morelle M.D.   On: 08/06/2018 19:07   Ct Lumbar Spine Wo  Contrast  Result Date: 08/06/2018 CLINICAL DATA:  Fall 2 days ago. Generalized weakness. L1 compression fracture. Progressive pain. EXAM: CT LUMBAR SPINE WITHOUT CONTRAST TECHNIQUE: Multidetector CT imaging of the lumbar spine was performed without intravenous contrast administration. Multiplanar CT image reconstructions were also generated. COMPARISON:  None. FINDINGS: Segmentation: 5 non rib-bearing lumbar type vertebral bodies are present. The lowest fully formed vertebral body is L5. Alignment: AP alignment is anatomic. There straightening of the normal lumbar lordosis. No significant curvature is present. Vertebrae: Anterior compression fracture is present at L1. There is involvement of both the superior and inferior endplates. There is 60% loss of height anteriorly compared to the posterior endplate. No other acute fractures are present. Endplate sclerotic changes are present at L5-S1. Paraspinal and other soft tissues: Atherosclerotic calcifications are present in the aorta and branch vessels. There is no aneurysm. No focal solid organ lesions are present. Disc levels: T12-L1: No significant stenosis. L1-2: Mild disc bulging is present. Mild facet hypertrophy is noted bilaterally. There is no significant stenosis. L2-3: Mild disc bulging and facet hypertrophy is present without significant stenosis. L3-4: A far left lateral disc protrusion is present. The disc extends into the left neural foramen. Mild left foraminal stenosis is present. The central canal is patent. L4-5: A broad-based disc protrusion and moderate facet hypertrophy is present. There is moderate central canal narrowing with subarticular narrowing bilaterally. Moderate bilateral foraminal narrowing is also present. L5-S1: Asymmetric left-sided facet hypertrophy is present. There is asymmetric spurring from the endplate. This results in moderate left foraminal stenosis. The central canal is patent. IMPRESSION: 1. Anterior compression  fracture  at L1 involves the superior and inferior endplates. There is over 50% loss of height. No retropulsed bone is present. 2. No other focal fractures are present. 3. Far left lateral disc protrusion at L3-4 with mild left foraminal stenosis. 4. Moderate central canal stenosis with moderate subarticular and foraminal stenosis bilaterally at L4-5. 5. Moderate left foraminal stenosis at L5-S1. Electronically Signed   By: San Morelle M.D.   On: 08/06/2018 19:03    Assessment/Plan: Mobilize with PT.  Patient to wear brace when up.      LOS: 1 day    Peggyann Shoals, MD 08/08/2018, 8:28 AM

## 2018-08-08 NOTE — TOC Progression Note (Signed)
Transition of Care [facility]) - Progression Note    Patient Details  Name: Melinda Eaton MRN: [id number] Date of Birth: March 11, 1940  Transition of Care [facility]) CM/SW Contact  Wandra Feinstein [place], [place] Phone Number: 08/08/2018, 11:02 AM  Clinical Narrative:   Spoke to [place] in admissions at [facility] who confirmed they are able to offer. Notified pt's DIL, Dorian Pod [phone], who has accepted offer. [facility] to initiate auth with [facility] today.     Expected Discharge Plan: [place] Barriers to Discharge: Continued Medical Work up, Ship broker  Expected Discharge Plan and Services Expected Discharge Plan: [place] Choice: [place] arrangements for the past 2 months: Single Family Home                                       Social Determinants of Health (SDOH) Interventions    Readmission Risk Interventions Readmission Risk Prevention Plan 06/18/2018  Transportation Screening Complete  PCP or Specialist Appt within 5-7 Days Complete  Home Care Screening Complete  Medication Review (RN CM) Complete  Some recent data might be hidden

## 2018-08-08 NOTE — Plan of Care (Signed)
  Problem: Education: Goal: Knowledge of General Education information will improve Description Including pain rating scale, medication(s)/side effects and non-pharmacologic comfort measures Outcome: Progressing   Problem: Clinical Measurements: Goal: Ability to maintain clinical measurements within normal limits will improve Outcome: Progressing   Problem: Activity: Goal: Risk for activity intolerance will decrease Outcome: Progressing   

## 2018-08-08 NOTE — Progress Notes (Signed)
Physical Therapy Treatment Patient Details Name: Melinda Eaton MRN: [id number] DOB: August 02, 1940 Today's Date: 08/08/2018    History of Present Illness Patient is a 78 y/o female admitted after fall at home with L1 compression fx. 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 PMH includes CVA, HTN, Hepatitis, macular degeneration, anxiety.    PT Comments    Pt very pleasant and eager to move today. Pt reports that even though she lives with family they are gone the majority of the time and she does not feel like she could manage brace (currently max assist for brace don/doff), precautions, and mobility without assist. Pt with improved gait tolerance and limited need for AD who will continue to benefit from further education for precautions and safety. Recommend daily mobility with nursing staff.     Follow Up Recommendations  Supervision for mobility/OOB;SNF     Equipment Recommendations  None recommended by PT    Recommendations for Other Services       Precautions / Restrictions Precautions Precautions: Fall;Back Precaution Booklet Issued: Yes (comment) Precaution Comments: reviewed back precautions with pt with handout Required Braces or Orthoses: Spinal Brace Spinal Brace: Thoracolumbosacral orthotic;Applied in sitting position    Mobility  Bed Mobility Overal bed mobility: Needs Assistance Bed Mobility: Rolling;Sidelying to Sit Rolling: Supervision Sidelying to sit: Supervision       General bed mobility comments: cues for sequence to maintain precautions and progress activity with pt propping on right elbow on arrival and cues for correct positioning in bed  Transfers Overall transfer level: Needs assistance   Transfers: Sit to/from Stand Sit to Stand: Modified independent (Device/Increase time)         General transfer comment: pt able to stand from bed, recliner and toilet without assist with good hand placement and  safety  Ambulation/Gait Ambulation/Gait assistance: Min guard Gait Distance (Feet): 200 Feet Assistive device: 1 person hand held assist;None Gait Pattern/deviations: Step-through pattern;Decreased stride length   Gait velocity interpretation: >2.62 ft/sec, indicative of community ambulatory General Gait Details: pt initially using RW but was holding back legs off ground due to noise and transition to HHA with very slight assist and increased gait distance. pt walked additional 20' in room x 2 without HHA with minguard and 1LOB without AD with min assist to recover   Stairs             Wheelchair Mobility    Modified Rankin (Stroke Patients Only)       Balance Overall balance assessment: Needs assistance;History of Falls   Sitting balance-Leahy Scale: Good Sitting balance - Comments: pt able to sit EOB, on toilet and at chair without difficulty     Standing balance-Leahy Scale: Good Standing balance comment: pt able to stand to wash hands at sink without UE support and walk limited distance in room without UE support                            Cognition Arousal/Alertness: Awake/alert Behavior During Therapy: WFL for tasks assessed/performed Overall Cognitive Status: History of cognitive impairments - at baseline                                 General Comments: pt with STM deficits with inability to recall precautions or repeat education for brace wear      Exercises General Exercises - Lower Extremity  Long Arc Quad: AROM;Both;Seated;15 reps Hip Flexion/Marching: AROM;15 reps;Both;Seated    General Comments        Pertinent Vitals/Pain Pain Assessment: No/denies pain    Home Living                      Prior Function            PT Goals (current goals can now be found in the care plan section) Progress towards PT goals: Progressing toward goals    Frequency           PT Plan Current plan remains appropriate     Co-evaluation              AM-PAC PT "6 Clicks" Mobility   Outcome Measure  Help needed turning from your back to your side while in a flat bed without using bedrails?: A Little Help needed moving from lying on your back to sitting on the side of a flat bed without using bedrails?: A Little Help needed moving to and from a bed to a chair (including a wheelchair)?: A Little Help needed standing up from a chair using your arms (e.g., wheelchair or bedside chair)?: A Little Help needed to walk in hospital room?: A Little Help needed climbing 3-5 steps with a railing? : A Little 6 Click Score: 18    End of Session Equipment Utilized During Treatment: Back brace Activity Tolerance: Patient tolerated treatment well Patient left: in chair;with call bell/phone within reach;with chair alarm set Nurse Communication: Mobility status PT Visit Diagnosis: Other abnormalities of gait and mobility (R26.89)     Time: 1000-1026 PT Time Calculation (min) (ACUTE ONLY): 26 min  Charges:  $Gait Training: 8-22 mins $Therapeutic Activity: 8-22 mins                     [place] Pager: [phone] Office: [place] 08/08/2018, 1:33 PM

## 2018-08-08 NOTE — Progress Notes (Addendum)
PROGRESS NOTE  Melinda Eaton PYK:[id number] DOB: 10-08-1940 DOA: 08/06/2018 PCP: Christain [place], MD  Brief History   78 year old female with history of diabetes mellitus, hypertension, CVA, intracranial hemorrhage in April 2020 experienced shaking and fell out of the chair on 08/04/2018.  Since then patient was experiencing severe back pain concerning this patient is brought to the emergency department.  Work-up in the emergency department with CT of the lumbar spine showed L1 compression fracture with 50% height loss, no retropulsion.  Patient is evaluated by neurosurgery, recommended TLSO brace and rehab.  Urine analysis is negative.  No obvious signs of infection are noted.  Patient is afebrile in the emergency department.  Elevated by physical therapy with a TLSO brace, recommended rehab at SNF.  Neurosurgery recommended to follow-up in the hospital to further evaluate any need for the surgery. The patient has been fitted for a TLSO brace. She is to wear it when up. She is to work with physical therapy.  Consultants  . Neurosurgery  Procedures  . None  Antibiotics   Anti-infectives (From admission, onward)   None    .  Marland Kitchen   Subjective  The patient is resting comfortably. No new complaints.  Objective   Vitals:  Vitals:   08/08/18 1148 08/08/18 1644  BP: 118/68 118/78  Pulse: 78 89  Resp: 15 17  Temp: 98.3 F (36.8 C) 97.9 F (36.6 C)  SpO2: 99% 99%    Exam:  Constitutional:  . The patient is awake, alert, and oriented x 3. No acute distress. Respiratory:  . No increased work of breathing. . No wheezes, rales, or rhonchi. . No tactile fremitus Cardiovascular:  . RRR, no m/r/g . No LE extremity edema   . Normal pedal pulses Abdomen:  . Abdomen appears normal; no tenderness or masses . No hernias . No HSM Musculoskeletal:  . Digits/nails BUE: no clubbing, cyanosis, petechiae, infection . exam of joints, bones, muscles of at least one of following: head/neck,  RUE, LUE, RLE, LLE   o strength and tone normal, no atrophy, no abnormal movements o No tenderness, masses o Normal ROM, no contractures  . gait and station Skin:  . No rashes, lesions, ulcers . palpation of skin: no induration or nodules Neurologic:  . CN 2-12 intact . Sensation all 4 extremities intact Psychiatric:  . Mental status o Mood, affect appropriate o Orientation to person, place, time  . judgment and insight appear intact     I have personally reviewed the following:   Today's Data  . Vitals  Scheduled Meds: . atorvastatin  40 mg Oral Daily  . carvedilol  6.25 mg Oral BID  . finasteride  5 mg Oral Daily  . insulin aspart  0-9 Units Subcutaneous TID WC  . losartan  50 mg Oral BID  . multivitamin  2 tablet Oral Daily  . polyethylene glycol  17 g Oral Daily   Continuous Infusions:  Principal Problem:   Closed compression fracture of L1 vertebra (HCC) Active Problems:   Hypertension   Diabetes (HCC)   Chronic combined systolic and diastolic heart failure (HCC)   LOS: 1 day    A & P  Closed L1 compression fx with 50% height loss: Neurosurgery was consulted and recommendation was for TLSO brace. She has been evaluated by PT. PT has recommended SNF, if patient does not have assistance at home.  Patient states lives with her daughter and son-in-law, both of them work. Kyphoplasty will be decided by neurosurgery.  Pt is not a safe discharge at this time  DM2: Oral antihyperglycemics are held. [name] glucoses are being followed with FSBS and SSI.  HTN: Continue coreg and losartan. Blood pressures are well controlled currently.  Mild hyponatremia: Resolved.  Shaking episode: The pt is a poor historian. States had shaking episodes X2 in the last 2 weeks. No signs of infection on UA or CHR, afebrile. There concern about seizures as pt had recent ICH. EEG is pending.  DVT prophylaxis: SCD's Code Status: Full Code Family Communication: None available  Disposition Plan: SNF vs home with 24/7 supervision.   Vikkie Goeden, DO [facility] Direct contact: see [email or web address]  7PM-7AM contact night coverage as above 08/08/2018, 7:55 PM  LOS: 1 day

## 2018-08-09 LAB — COMPREHENSIVE METABOLIC PANEL
ALT: 12 U/L (ref 0–44)
AST: 15 U/L (ref 15–41)
Albumin: 2.9 g/dL — ABNORMAL LOW (ref 3.5–5.0)
Alkaline Phosphatase: 56 U/L (ref 38–126)
Anion gap: 11 (ref 5–15)
BUN: 15 mg/dL (ref 8–23)
CO2: 22 mmol/L (ref 22–32)
Calcium: 8.8 mg/dL — ABNORMAL LOW (ref 8.9–10.3)
Chloride: 100 mmol/L (ref 98–111)
Creatinine, Ser: 0.86 mg/dL (ref 0.44–1.00)
GFR calc Af Amer: >60 mL/min (ref >60)
GFR calc non Af Amer: >60 mL/min (ref >60)
Glucose, Bld: 123 mg/dL — ABNORMAL HIGH (ref 70–99)
Potassium: 3.2 mmol/L — ABNORMAL LOW (ref 3.5–5.1)
Sodium: 133 mmol/L — ABNORMAL LOW (ref 135–145)
Total Bilirubin: 1 mg/dL (ref 0.3–1.2)
Total Protein: 5.9 g/dL — ABNORMAL LOW (ref 6.5–8.1)

## 2018-08-09 LAB — CBC WITH DIFFERENTIAL/PLATELET
Abs Immature Granulocytes: 0.02 10*3/uL (ref 0.00–0.07)
Basophils Absolute: 0.1 10*3/uL (ref 0.0–0.1)
Basophils Relative: 1 %
Eosinophils Absolute: 0.2 10*3/uL (ref 0.0–0.5)
Eosinophils Relative: 2 %
HCT: 37.2 % (ref 36.0–46.0)
Hemoglobin: 12.5 g/dL (ref 12.0–15.0)
Immature Granulocytes: 0 %
Lymphocytes Relative: 20 %
Lymphs Abs: 1.3 10*3/uL (ref 0.7–4.0)
MCH: 28.7 pg (ref 26.0–34.0)
MCHC: 33.6 g/dL (ref 30.0–36.0)
MCV: 85.5 fL (ref 80.0–100.0)
Monocytes Absolute: 0.8 10*3/uL (ref 0.1–1.0)
Monocytes Relative: 12 %
Neutro Abs: 4.3 10*3/uL (ref 1.7–7.7)
Neutrophils Relative %: 65 %
Platelets: 200 10*3/uL (ref 150–400)
RBC: 4.35 MIL/uL (ref 3.87–5.11)
RDW: 15 % (ref 11.5–15.5)
WBC: 6.6 10*3/uL (ref 4.0–10.5)
nRBC: 0 % (ref 0.0–0.2)

## 2018-08-09 LAB — GLUCOSE, CAPILLARY
Glucose-Capillary: 117 mg/dL — ABNORMAL HIGH (ref 70–99)
Glucose-Capillary: 131 mg/dL — ABNORMAL HIGH (ref 70–99)

## 2018-08-09 MED ORDER — HYDRALAZINE HCL 20 MG/ML IJ SOLN
10.0000 mg | Freq: Once | INTRAMUSCULAR | Status: DC
  Filled 2018-08-09: qty 1

## 2018-08-09 MED ORDER — SODIUM CHLORIDE 0.9 % IV BOLUS
500.0000 mL | Freq: Once | INTRAVENOUS | Status: AC
  Administered 2018-08-09: 500 mL via INTRAVENOUS

## 2018-08-09 MED ORDER — HYDROCODONE-ACETAMINOPHEN 5-325 MG PO TABS: 1.0000 | ORAL_TABLET | ORAL | 0 refills | Status: AC | PRN

## 2018-08-09 NOTE — TOC Progression Note (Signed)
Transition of Care [facility]) - Progression Note    Patient Details  Name: NAJMO PARDUE MRN: [id number] Date of Birth: March 11, 1940  Transition of Care [facility]) CM/SW Contact  Wandra Feinstein [place], [place] Phone Number: 08/09/2018, 1:33 PM  Clinical Narrative:   Per [facility] in [facility] ([facility]) admissions, [facility] auth received and pt's dtr has completed paperwork. [facility] is able to admit today pending medical clearance. MD paged with update.    Expected Discharge Plan: [place] Barriers to Discharge: Continued Medical Work up, Ship broker  Expected Discharge Plan and Services Expected Discharge Plan: [place] Choice: [place] arrangements for the past 2 months: Single Family Home                                       Social Determinants of Health (SDOH) Interventions    Readmission Risk Interventions Readmission Risk Prevention Plan 06/18/2018  Transportation Screening Complete  PCP or Specialist Appt within 5-7 Days Complete  Home Care Screening Complete  Medication Review (RN CM) Complete  Some recent data might be hidden

## 2018-08-09 NOTE — Progress Notes (Signed)
Occupational Therapy Treatment Patient Details Name: Melinda Eaton MRN: [id number] DOB: Apr 29, 1940 Today's Date: 08/09/2018    History of present illness Patient is a 78 y/o female admitted after fall at home with L1 compression fx. 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 PMH includes CVA, HTN, Hepatitis, macular degeneration, anxiety.   OT comments  Pt performing bed mobility with supervisionA and minguardA for sit to stands. Pt ambulating with minguard and RW with cues for safety and pt requiring maxA for grooming at sink as pt reports dizziness. BP 80/50 x2 times in sitting s/p exertion and once returned to bed in supine x2 mins of rest. Pt ambulating to bathroom for toilet hygiene with minA in standing with RW- fair balance noted. Pt tolerating session fair. RN aware. Pt requiring continued OT for ADL and safety. OT following acutely.    Follow Up Recommendations  SNF;Supervision/Assistance - 24 hour    Equipment Recommendations  None recommended by OT    Recommendations for Other Services      Precautions / Restrictions Precautions Precautions: Fall;Back Precaution Booklet Issued: Yes (comment) Precaution Comments: reviewed back precautions with pt with handout Spinal Brace: Thoracolumbosacral orthotic;Applied in sitting position Restrictions Weight Bearing Restrictions: No       Mobility Bed Mobility Overal bed mobility: Needs Assistance Bed Mobility: Rolling;Sidelying to Sit Rolling: Supervision Sidelying to sit: Supervision   Sit to supine: Supervision      Transfers Overall transfer level: Needs assistance Equipment used: Rolling walker (2 wheeled)   Sit to Stand: Min guard         General transfer comment: performing sit to stands with minguardA today due to dizziness    Balance Overall balance assessment: Needs assistance;History of Falls Sitting-balance support: Feet supported;No upper extremity  supported Sitting balance-Leahy Scale: Good       Standing balance-Leahy Scale: Fair Standing balance comment: Pt performing grooming task in seated due to dizziness.               High Level Balance Comments: towards HOB minguardA           ADL either performed or assessed with clinical judgement   ADL Overall ADL's : Needs assistance/impaired     Grooming: Maximal assistance;Standing Grooming Details (indicate cue type and reason): minA for adherence to back precautions                 Toilet Transfer: Ambulation;RW;Min guard Toilet Transfer Details (indicate cue type and reason): for safety  Toileting- Clothing Manipulation and Hygiene: Sit to/from stand;Min guard Toileting - Clothing Manipulation Details (indicate cue type and reason): pt completed posterior pericare with minguard for stability in standing;     Functional mobility during ADLs: Min guard;Rolling walker General ADL Comments: pt with dizziness with OOB at activity- 83/53 during grooming task and consistent with after 2 min rest 80/49. RN aware.      Vision   Vision Assessment?: No apparent visual deficits   Perception     Praxis      Cognition Arousal/Alertness: Awake/alert Behavior During Therapy: WFL for tasks assessed/performed Overall Cognitive Status: History of cognitive impairments - at baseline                                 General Comments: pt with STM deficits with inability to recall precautions or repeat education for brace wear  Exercises     Shoulder Instructions       General Comments Pt attempting to clean dentures, but pt too fatigued to perform without maxA. pt combing hair with Modified independence.    Pertinent Vitals/ Pain       Pain Assessment: 0-10 Pain Score: 6  Pain Location: back Pain Descriptors / Indicators: Discomfort  Home Living Family/patient expects to be discharged to:: Private residence Living Arrangements:  Children                                      Prior Functioning/Environment              Frequency  Min 2X/week        Progress Toward Goals  OT Goals(current goals can now be found in the care plan section)  Progress towards OT goals: Progressing toward goals  Acute Rehab OT Goals Patient Stated Goal: to go home OT Goal Formulation: With patient Time For Goal Achievement: 08/23/18 Potential to Achieve Goals: Good ADL Goals Pt Will Perform Grooming: with modified independence;standing Pt Will Perform Upper Body Dressing: with modified independence;sitting Pt Will Perform Lower Body Dressing: with modified independence;with adaptive equipment;sit to/from stand Pt Will Transfer to Toilet: with modified independence;ambulating Additional ADL Goal #1: Pt will independently verbalize proper wear schedule for back brace. Additional ADL Goal #2: Pt will ambulate with least restrictive device a house hold distance with modified independence. Additional ADL Goal #3: Pt will verbalize 3 energy conservation technique to utilize during daily routine in order to optimzie safety and independence with ADLs/ IADLs.  Plan Discharge plan remains appropriate    Co-evaluation                 AM-PAC OT "6 Clicks" Daily Activity     Outcome Measure   Help from another person eating meals?: None Help from another person taking care of personal grooming?: A Little Help from another person toileting, which includes using toliet, bedpan, or urinal?: A Lot Help from another person bathing (including washing, rinsing, drying)?: A Lot Help from another person to put on and taking off regular upper body clothing?: A Little Help from another person to put on and taking off regular lower body clothing?: A Little 6 Click Score: 17    End of Session Equipment Utilized During Treatment: Rolling walker;Back brace  OT Visit Diagnosis: Unsteadiness on feet (R26.81);Muscle  weakness (generalized) (M62.81);History of falling (Z91.81);Other abnormalities of gait and mobility (R26.89)   Activity Tolerance Patient tolerated treatment well   Patient Left in bed;with call bell/phone within reach;with bed alarm set   Nurse Communication Mobility status        Time: 0932-6712 OT Time Calculation (min): 30 min  Charges: OT General Charges $OT Visit: 1 Visit OT Treatments $Self Care/Home Management : 23-37 mins  Ebony Hail Harold Hedge) Marsa Aris OTR/L Acute Rehabilitation Services Pager: [phone] Office: [phone]    Audie Pinto 08/09/2018, 3:47 PM

## 2018-08-09 NOTE — Progress Notes (Addendum)
Subjective: Patient reports "I'm not hurting now." "The brace cuts into my shoulders"  Objective: Vital signs in last 24 hours: Temp:  [97.9 F (36.6 C)-98.6 F (37 C)] 98.6 F (37 C) (06/17 0400) Pulse Rate:  [72-89] 72 (06/17 0700) Resp:  [15-18] 16 (06/17 0700) BP: (118-210)/(62-89) 210/88 (06/17 0700) SpO2:  [96 %-99 %] 98 % (06/17 0700)  Intake/Output from previous day: 06/16 0701 - 06/17 0700 In: 150 [P.O.:150] Out: -  Intake/Output this shift: No intake/output data recorded.  Alert, conversant, reporting no pain at present. She notes some back pain when mobilizing in TLSO, but states discomfort of the brace shoulder straps overshadows back pain. Good strength BLE. MAEW.  Lab Results: Recent Labs    08/06/18 1756 08/09/18 0516  WBC 10.2 6.6  HGB 13.4 12.5  HCT 41.9 37.2  PLT 249 200   BMET Recent Labs    08/06/18 1756 08/09/18 0516  NA 133* 133*  K 3.5 3.2*  CL 99 100  CO2 24 22  GLUCOSE 89 123*  BUN 18 15  CREATININE 1.01* 0.86  CALCIUM 9.0 8.8*    Studies/Results: No results found.  Assessment/Plan: Improving  LOS: 2 days  Continue to mobilize in brace. If kyphosis is not noticeable when upright, may consider releasing shoulder straps.     Verdis Prime 08/09/2018, 7:55 AM   Patient will likely do well with brace fitted for comfort.

## 2018-08-09 NOTE — Progress Notes (Signed)
Melinda Eaton to be D/C'd per MD order. Discussed with the patient and all questions fully answered. ? VSS, Skin clean, dry and intact without evidence of skin break down, no evidence of skin tears noted. ? IV catheter discontinued intact. Site without signs and symptoms of complications. Dressing and pressure applied. ? An After Visit Summary was printed and given to the patient. Patient informed where to pickup prescriptions. ? D/c education completed with patient/family including follow up instructions, medication list, d/c activities limitations if indicated, with other d/c instructions as indicated by MD - patient able to verbalize understanding, all questions fully answered.  ? Patient instructed to return to ED, call 911, or call MD for any changes in condition.  ? Patient to be escorted via stretcher, and D/C to [facility] via [facility].

## 2018-08-09 NOTE — TOC Transition Note (Signed)
Transition of Care [facility]) - CM/SW Discharge Note   Patient Details  Name: Melinda Eaton MRN: [id number] Date of Birth: 1940-12-20  Transition [facility]) CM/SW Contact:  Amador Cunas, [place] Phone Number: 08/09/2018, 4:37 PM   Clinical Narrative:   Pt for d/c [facility] today. Pt's dtr, Dorian Pod, aware of d/c and reports agreeable. Spoke to Soy in admissions who confirmed they are prepared to accept pt to room 406 today. RN provided with number for report and [facility] arranged for transport.     Final next level of care: Skilled Nursing Facility Barriers to Discharge: No Barriers Identified   Patient Goals and CMS Choice   CMS Medicare.gov Compare Post Acute Care list provided to:: (dtr declined, previous involvement with SNF) Choice offered to / list presented to : Adult Children  Discharge Placement PASRR number recieved: 08/09/18([phone])            [facility] Patient to be transferred to facility by: Ambulance/[facility] Name of family member notified: Mertha Baars Patient and family notified of of transfer: 08/09/18  Discharge Plan and Services     Post Acute Care Choice: [place]                               Social Determinants of Health (SDOH) Interventions     Readmission Risk Interventions Readmission Risk Prevention Plan 06/18/2018  Transportation Screening Complete  PCP or Specialist Appt within 5-7 Days Complete  Home Care Screening Complete  Medication Review (RN CM) Complete  Some recent data might be hidden

## 2018-08-09 NOTE — Discharge Summary (Addendum)
Physician Discharge Summary  Melinda Eaton NUU:[id number] DOB: [birth year] DOA: 08/06/2018  PCP: Christain [place], MD  Admit date: 08/06/2018 Discharge date: 08/09/2018  Recommendations for Outpatient Follow-up:  1. Follow up with neurosurgery. 2. Follow up with PCP in 7-10 days after discharge from SNF. 3. Wear TLSO brace when up and working with PT/OT 4. PT/OT evaluate and treatment at SNF.  Contact information for after-discharge care    Destination    [facility] .   Service: Skilled Nursing Contact information: [address] [place] [phone]               Discharge Diagnoses: Principal diagnosis is #1 1. Closed compression fx with 50% height loss. 2. DM II 3. HTN 4. Mild hyponatremia.  Discharge Condition: fair Disposition: SNF  Diet recommendation: Carbohydrate controlled diet.  Filed Weights   08/06/18 2230  Weight: 58.9 kg    History of present illness:  Melinda Eaton is a 78 y.o. female with medical history significant of DM2, HTN, CVA, ICH back in April.  Patient had fall off rocking chair on Friday.  Since that time has had back pain.  Back pain is 1/10 at rest but increases to 7-8/10 with movement.  Pain is midline, no radiation.  Trying tylenol.  No BLE weakness nor bladder/bowel dysfunction.  Has been ambulating since fall but with difficulty due to pain.  Hospital Course:  78 year old female with history of diabetes mellitus, hypertension, CVA, intracranial hemorrhage in April 2020 experienced shaking and fell out of the chair on 08/04/2018. Since then patient was experiencing severe back pain concerning this patient is brought to the emergency department. Work-up in the emergency department with CT of the lumbar spine showed L1 compression fracture with 50% height loss, no retropulsion. Patient is evaluated by neurosurgery, recommended TLSO brace and rehab. Urine analysis is negative. No obvious signs  of infection are noted. Patient is afebrile in the emergency department. Elevated by physical therapy with a TLSO brace, recommended rehab at SNF. Neurosurgery recommended to follow-up in the hospital to further evaluate any need for the surgery. The patient has been fitted for a TLSO brace. She is to wear it when up. She is to work with physical therapy.  Today's assessment: S: The patient is resting comfortably. No new no complaints. O: Vitals:  Vitals:   08/09/18 1053 08/09/18 1153  BP: (!) 86/44 (!) 116/56  Pulse:  (!) 50  Resp:  16  Temp:  97.9 F (36.6 C)  SpO2:  96%    Constitutional:   The patient is awake, alert, and oriented x 3. No acute distress. Respiratory:   No increased work of breathing.  No wheezes, rales, or rhonchi.  No tactile fremitus Cardiovascular:   Regular rate and rhythm  No murmurs, ectopy, or gallups  No lateral PMI. No thrills. Abdomen:   Abdomen is soft, non-tender, non-distended.  No hernias, masses, or organomely  Normoactive bowel sounds. Musculoskeletal:   No cyanosis, clubbing or edema Skin:   No rashes, lesions, ulcers  palpation of skin: no induration or nodules Neurologic:   CN 2-12 intact  Sensation all 4 extremities intact Psychiatric:   judgement and insight appear normal  Mental status o Mood, affect appropriate o Orientation to person, place, time   Discharge Instructions  Discharge Instructions    Activity as tolerated - No restrictions   Complete by: As directed    Call MD for:  difficulty breathing, headache or visual  disturbances   Complete by: As directed    Call MD for:  persistant nausea and vomiting   Complete by: As directed    Call MD for:  severe uncontrolled pain   Complete by: As directed    Diet Carb Modified   Complete by: As directed    Discharge instructions   Complete by: As directed    PT/OT eval and treat. Follow up with neurosurgery as directed. Follow up with PCP in 7-10  days after discharge from SNF.   Increase activity slowly   Complete by: As directed      Allergies as of 08/09/2018      Reactions   Bactrim [sulfamethoxazole-trimethoprim] Other (See Comments)   hepatitis   Metformin And Related Other (See Comments)   Upset stomach Decrease in weight   Doxycycline Other (See Comments)   Headache      Medication List    STOP taking these medications   ciprofloxacin 500 MG tablet Commonly known as: CIPRO     TAKE these medications   acetaminophen 500 MG tablet Commonly known as: TYLENOL Take 1 tablet (500 mg total) by mouth every 6 (six) hours as needed for headache. What changed: when to take this   aspirin 81 MG tablet Take 1 tablet (81 mg total) by mouth daily.   atorvastatin 40 MG tablet Commonly known as: LIPITOR Take 40 mg by mouth daily.   BALMEX EX Apply 1 application topically as needed (barrier cream).   carvedilol 6.25 MG tablet Commonly known as: COREG Take 1 tablet (6.25 mg total) by mouth 2 (two) times daily.   Eye Vitamins Caps Take 2 capsules by mouth daily.   finasteride 5 MG tablet Commonly known as: PROSCAR Take 5 mg by mouth daily.   glimepiride 2 MG tablet Commonly known as: AMARYL Take 2 mg by mouth daily.   HYDROcodone-acetaminophen 5-325 MG tablet Commonly known as: NORCO/VICODIN Take 1-2 tablets by mouth every 4 (four) hours as needed for severe pain.   losartan 50 MG tablet Commonly known as: COZAAR Take 50 mg by mouth 2 (two) times daily.   nicotine 14 mg/24hr patch Commonly known as: NICODERM CQ - dosed in mg/24 hours Place 1 patch (14 mg total) onto the skin daily.   ondansetron 4 MG disintegrating tablet Commonly known as: Zofran ODT Take 1 tablet (4 mg total) by mouth every 8 (eight) hours as needed for nausea or vomiting.   polyethylene glycol 17 g packet Commonly known as: MIRALAX / GLYCOLAX Take 17 g by mouth daily.      Allergies  Allergen Reactions   Bactrim  [Sulfamethoxazole-Trimethoprim] Other (See Comments)    hepatitis   Metformin And Related Other (See Comments)    Upset stomach Decrease in weight   Doxycycline Other (See Comments)    Headache     The results of significant diagnostics from this hospitalization (including imaging, microbiology, ancillary and laboratory) are listed below for reference.    Significant Diagnostic Studies: Ct Head Wo Contrast  Result Date: 08/06/2018 CLINICAL DATA:  Fall on Friday.  Increase in generalized weakness. EXAM: CT HEAD WITHOUT CONTRAST TECHNIQUE: Contiguous axial images were obtained from the base of the skull through the vertex without intravenous contrast. COMPARISON:  CT head without contrast 06/22/2018 FINDINGS: Brain: Remote right MCA territory encephalomalacia is stable. Moderate atrophy and white matter disease is stable. No acute infarct, hemorrhage, or mass lesion is present. The ventricles are proportionate to the degree of atrophy. Brainstem and cerebellum  are normal. Vascular: Atherosclerotic calcifications are present within the cavernous internal carotid arteries bilaterally. There is no hyperdense vessel. Skull: Calvarium is intact. No focal lytic or blastic lesions are present. Sinuses/Orbits: The paranasal sinuses and mastoid air cells are clear. The globes and orbits are within normal limits. IMPRESSION: 1. No acute intracranial abnormality or significant interval change. 2. Stable remote right MCA territory infarcts. Electronically Signed   By: San Morelle M.D.   On: 08/06/2018 19:07   Ct Lumbar Spine Wo Contrast  Result Date: 08/06/2018 CLINICAL DATA:  Fall 2 days ago. Generalized weakness. L1 compression fracture. Progressive pain. EXAM: CT LUMBAR SPINE WITHOUT CONTRAST TECHNIQUE: Multidetector CT imaging of the lumbar spine was performed without intravenous contrast administration. Multiplanar CT image reconstructions were also generated. COMPARISON:  None. FINDINGS:  Segmentation: 5 non rib-bearing lumbar type vertebral bodies are present. The lowest fully formed vertebral body is L5. Alignment: AP alignment is anatomic. There straightening of the normal lumbar lordosis. No significant curvature is present. Vertebrae: Anterior compression fracture is present at L1. There is involvement of both the superior and inferior endplates. There is 60% loss of height anteriorly compared to the posterior endplate. No other acute fractures are present. Endplate sclerotic changes are present at L5-S1. Paraspinal and other soft tissues: Atherosclerotic calcifications are present in the aorta and branch vessels. There is no aneurysm. No focal solid organ lesions are present. Disc levels: T12-L1: No significant stenosis. L1-2: Mild disc bulging is present. Mild facet hypertrophy is noted bilaterally. There is no significant stenosis. L2-3: Mild disc bulging and facet hypertrophy is present without significant stenosis. L3-4: A far left lateral disc protrusion is present. The disc extends into the left neural foramen. Mild left foraminal stenosis is present. The central canal is patent. L4-5: A broad-based disc protrusion and moderate facet hypertrophy is present. There is moderate central canal narrowing with subarticular narrowing bilaterally. Moderate bilateral foraminal narrowing is also present. L5-S1: Asymmetric left-sided facet hypertrophy is present. There is asymmetric spurring from the endplate. This results in moderate left foraminal stenosis. The central canal is patent. IMPRESSION: 1. Anterior compression fracture at L1 involves the superior and inferior endplates. There is over 50% loss of height. No retropulsed bone is present. 2. No other focal fractures are present. 3. Far left lateral disc protrusion at L3-4 with mild left foraminal stenosis. 4. Moderate central canal stenosis with moderate subarticular and foraminal stenosis bilaterally at L4-5. 5. Moderate left foraminal  stenosis at L5-S1. Electronically Signed   By: San Morelle M.D.   On: 08/06/2018 19:03    Microbiology: Recent Results (from the past 240 hour(s))  Urine culture     Status: None   Collection Time: 08/06/18  8:42 PM   Specimen: Urine, Random  Result Value Ref Range Status   Specimen Description URINE, RANDOM  Final   Special Requests NONE  Final   Culture   Final    NO GROWTH Performed at [facility], [address] [place], [place] [address]    Report Status 08/08/2018 FINAL  Final  SARS Coronavirus 2     Status: None   Collection Time: 08/06/18  9:47 PM  Result Value Ref Range Status   SARS Coronavirus 2 NOT DETECTED NOT DETECTED Final    Comment: (NOTE) SARS-CoV-2 target nucleic acids are NOT DETECTED. The SARS-CoV-2 RNA is generally detectable in upper and lower respiratory specimens during the acute phase of infection.  Negative  results do not preclude SARS-CoV-2 infection, do  not rule out co-infections with other pathogens, and should not be used as the sole basis for treatment or other patient management decisions.  Negative results must be combined with clinical observations, patient history, and epidemiological information. The expected result is Not Detected. Fact Sheet for Patients: http://www.biofiredefense.com/wp-content/uploads/2020/03/BIOFIRE-COVID -19-patients.pdf Fact Sheet for Healthcare Providers: http://www.biofiredefense.com/wp-content/uploads/2020/03/BIOFIRE-COVID -19-hcp.pdf This test is not yet approved or cleared by the [place] and  has been authorized for detection and/or diagnosis of SARS-CoV-2 by FDA under an Emergency Use Authorization (EUA).  This EUA will remain in effec t (meaning this test can be used) for the duration of  the COVID-19 declaration under Section 564(b)(1) of the Act, 21 U.S.C. section 360bbb-3(b)(1), unless the authorization is terminated or revoked sooner. Performed at [facility],  [place] [address] [place], [place] [address]      Labs: Basic Metabolic Panel: Recent Labs  Lab 08/06/18 1756 08/09/18 0516  NA 133* 133*  K 3.5 3.2*  CL 99 100  CO2 24 22  GLUCOSE 89 123*  BUN 18 15  CREATININE 1.01* 0.86  CALCIUM 9.0 8.8*   Liver Function Tests: Recent Labs  Lab 08/06/18 1756 08/09/18 0516  AST 16 15  ALT 11 12  ALKPHOS 53 56  BILITOT 0.9 1.0  PROT 6.9 5.9*  ALBUMIN 3.1* 2.9*   Recent Labs  Lab 08/06/18 1756  LIPASE 30   No results for input(s): AMMONIA in the last 168 hours. CBC: Recent Labs  Lab 08/06/18 1756 08/09/18 0516  WBC 10.2 6.6  NEUTROABS 7.5 4.3  HGB 13.4 12.5  HCT 41.9 37.2  MCV 88.6 85.5  PLT 249 200   Cardiac Enzymes: Recent Labs  Lab 08/06/18 1756  TROPONINI <0.03   BNP: BNP (last 3 results) No results for input(s): BNP in the last 8760 hours.  ProBNP (last 3 results) No results for input(s): PROBNP in the last 8760 hours.  CBG: Recent Labs  Lab 08/08/18 1159 08/08/18 1648 08/08/18 2117 08/09/18 0615 08/09/18 1232  GLUCAP 140* 100* 106* 117* 131*    Principal Problem:   Closed compression fracture of L1 vertebra (HCC) Active Problems:   Hypertension   Diabetes ([place])   Chronic combined systolic and diastolic heart failure ([place])   Time coordinating discharge: 38 minutes.  Signed:        Clarice Zulauf, DO [facility]  08/09/2018, 1:55 PM

## 2018-08-09 NOTE — Progress Notes (Signed)
Report called to [facility] at [facility].

## 2018-08-11 ENCOUNTER — Other Ambulatory Visit: Payer: Self-pay | Admitting: *Deleted

## 2018-08-11 DIAGNOSIS — I5042 Chronic combined systolic (congestive) and diastolic (congestive) heart failure: Secondary | ICD-10-CM

## 2018-08-11 NOTE — Telephone Encounter (Signed)
It looks like she is discharged from the hospital. Can you call to schedule her echocardiogram? She may still be having back pain (she was admitted with a spinal compression fracture).  So, if she has to schedule is a couple weeks from now, that is ok. Richardson Dopp, PA-C    08/11/2018 8:46 AM

## 2018-08-11 NOTE — Progress Notes (Signed)
New Echo order put in.

## 2018-08-22 MED ORDER — CARVEDILOL 3.125 MG PO TABS: 6.2500 mg | ORAL_TABLET | Freq: Two times a day (BID) | ORAL | 0 refills | Status: AC

## 2018-08-22 NOTE — Telephone Encounter (Addendum)
Reached out to Echo scheduling about patient and correct contact number for patient to be able to scheduled echo.  Spoke with Dtr in law who gave exact location of patient at  [facility] in  [place]. [phone].Called twice no answer at facility the third call got an answer. Nurse station transferred me to Marius Ditch who is the Scheduler for patients office visits etc. hes aware of echo depratment calling to make appt.  Patient Carvedilol updated n Epic to two tablets of 3.125 mg twice a day.

## 2018-08-22 NOTE — Addendum Note (Signed)
Addended by: Claude Manges on: 08/22/2018 09:29 AM   Modules accepted: Orders

## 2018-09-12 ENCOUNTER — Telehealth: Payer: Self-pay | Admitting: Neurology

## 2018-09-12 NOTE — Telephone Encounter (Signed)
error 

## 2018-10-12 ENCOUNTER — Encounter: Payer: Self-pay | Admitting: Adult Health

## 2018-10-12 ENCOUNTER — Ambulatory Visit: Payer: Medicare PPO | Admitting: Adult Health
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
## 2021-04-07 IMAGING — CT CT HEAD WITHOUT CONTRAST
4 series · 16 of 47 positions shown, 18 images · non-contrast
Comparison: 06/16/2018.

CLINICAL DATA: Headache and nausea. Previous intraventricular
hemorrhage 06/16/2018.

EXAM:
CT HEAD WITHOUT CONTRAST
TECHNIQUE: Contiguous axial images were obtained from the base of the skull
through the vertex without intravenous contrast.

[Series 3: head without · axial · non-contrast · 0.41mm/px · z∈[-102,+18]mm · 6 of 34 slices shown, 8 images]
[im 5/34  brain]
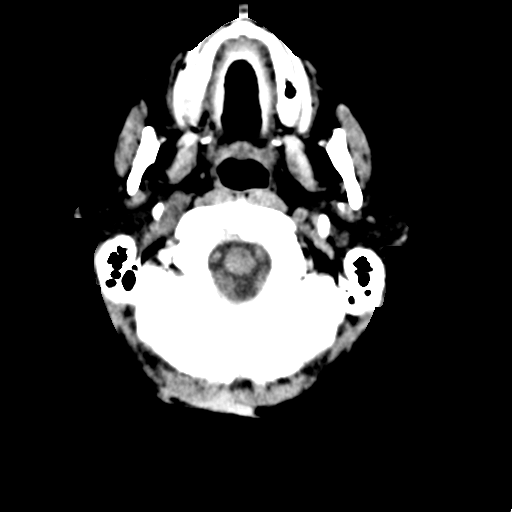
[im 5/34  bone]
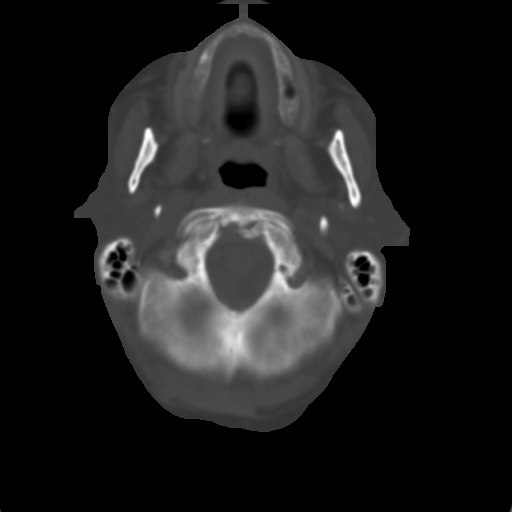
[im 10/34  brain]
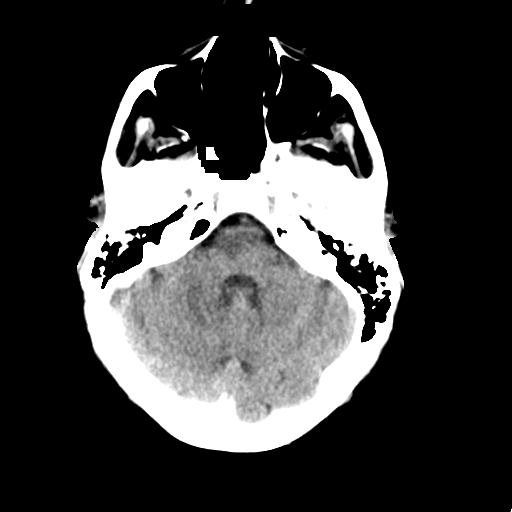
[im 15/34  brain]
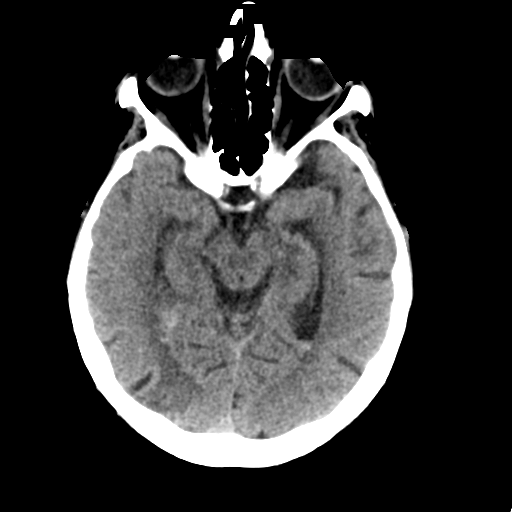
[im 19/34  brain]
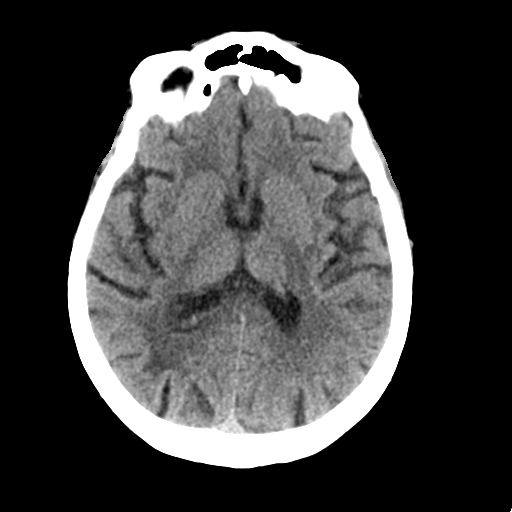
[im 24/34  brain]
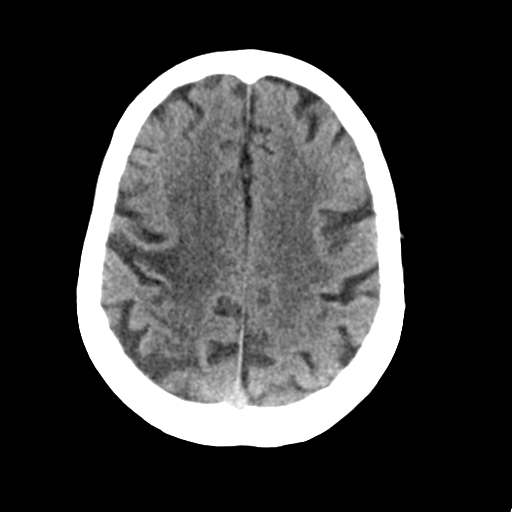
[im 24/34  bone]
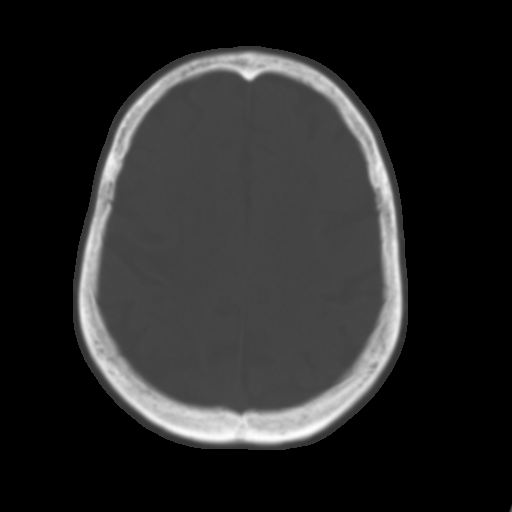
[im 29/34  brain]
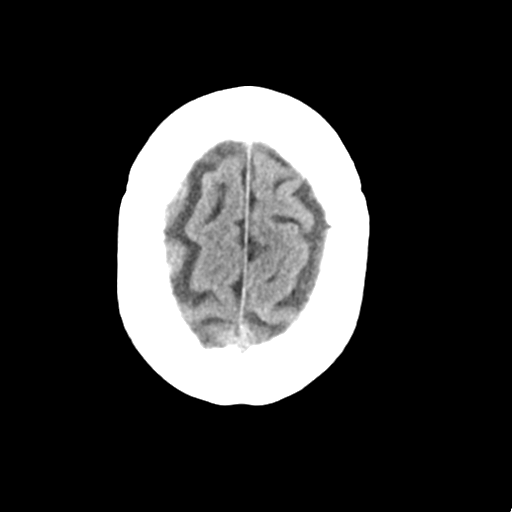

[Series 4: head bone · axial · 0.41mm/px · z∈[-106,-46]mm · 4 of 91 slices shown]
[im 9/91  bone]
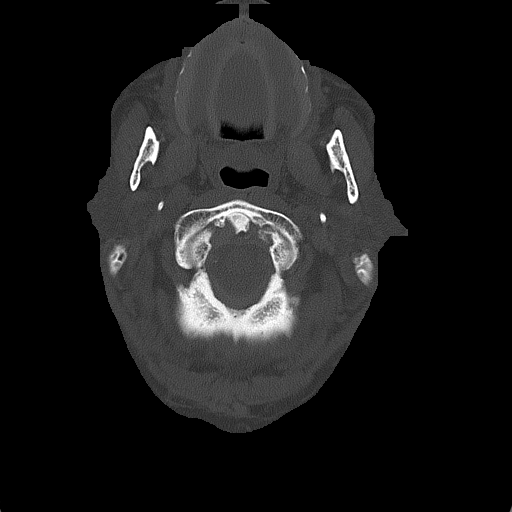
[im 18/91  bone]
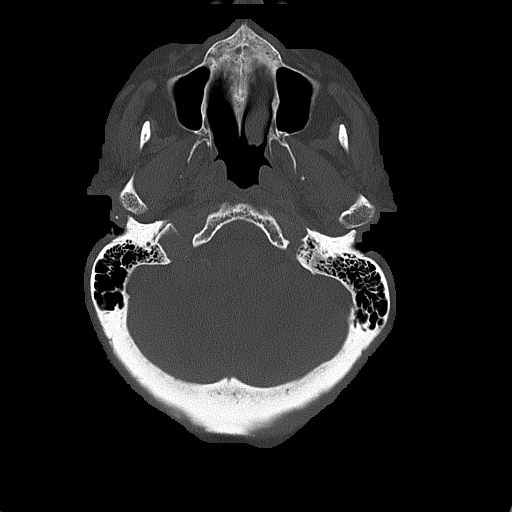
[im 31/91  bone]
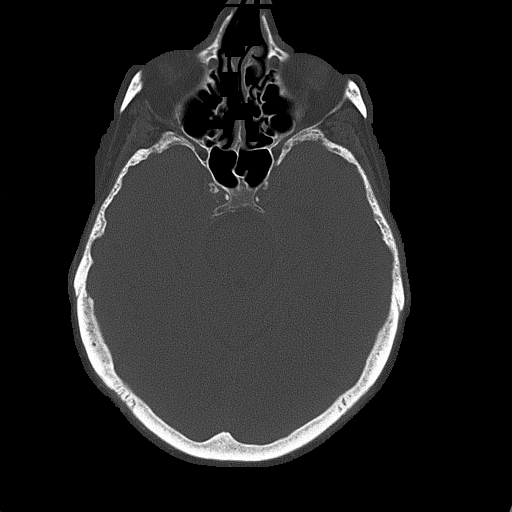
[im 39/91  bone]
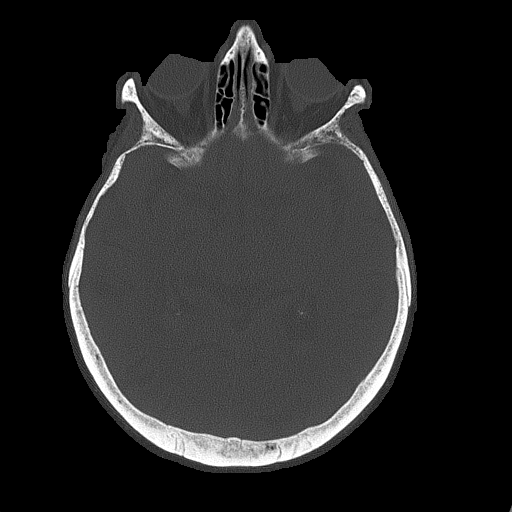

[Series 5: head without cor · coronal · non-contrast · 0.33mm/px · 3 of 67 slices shown]
[im 23/67  brain]
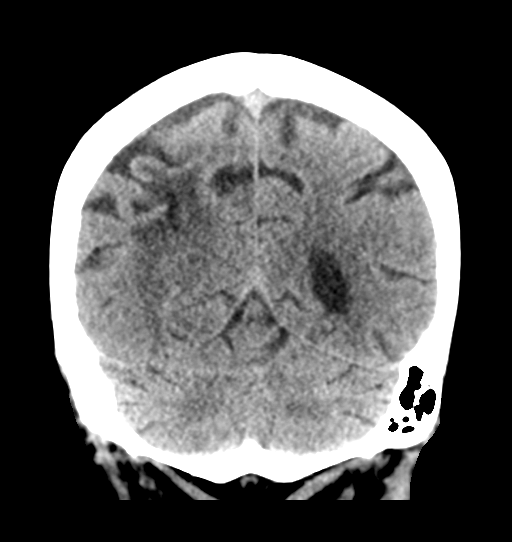
[im 30/67  brain]
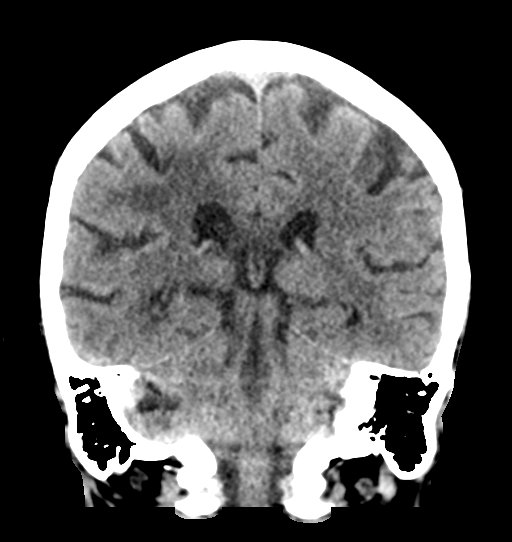
[im 37/67  brain]
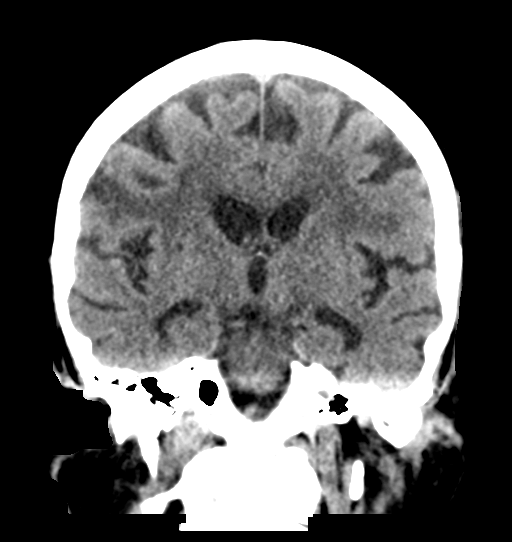

[Series 6: head without sag · sagittal · non-contrast · 0.35mm/px · 3 of 53 slices shown]
[im 18/53  brain]
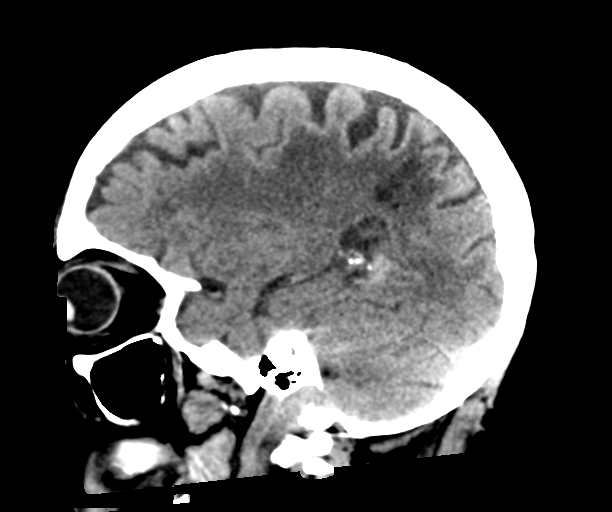
[im 27/53  brain]
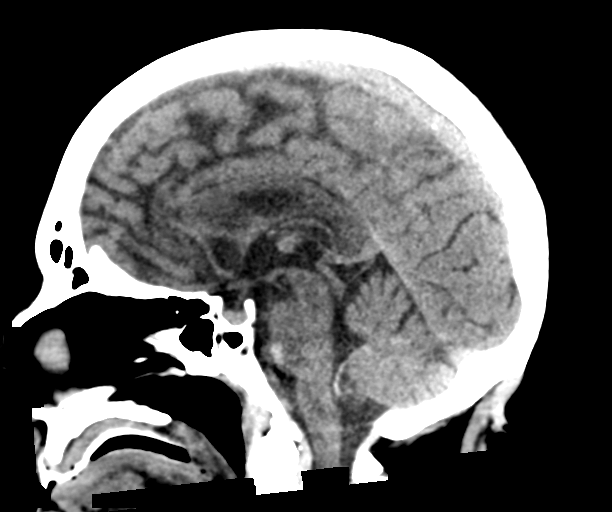
[im 35/53  brain]
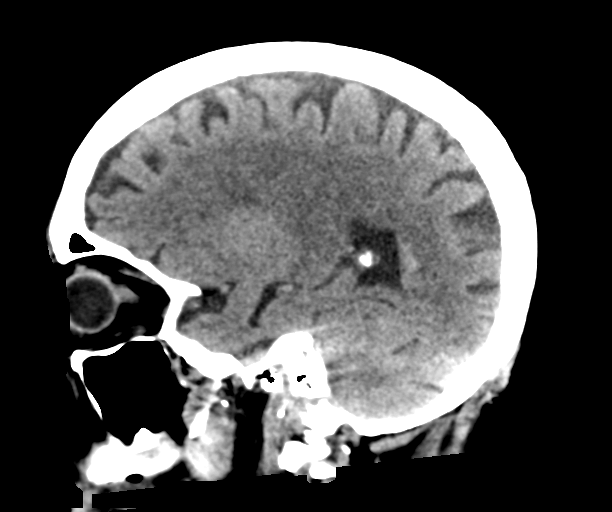

[16 of 47 positions shown; findings below may reference images not displayed]

FINDINGS: Brain: Background pattern of generalized atrophy, chronic
small-vessel ischemic changes of the white matter, old infarction in
the right parieto-occipital brain and right parietal brain appear
the same. No sign of acute infarction, mass lesion or extra-axial
collection. Previously seen intraventricular hemorrhage is evidence
only by a small amount of residual hyperdense blood in the occipital
horns of the lateral ventricles. Ventricular size is normal without
hydrocephalus.

Vascular: There is atherosclerotic calcification of the major
vessels at the base of the brain.

Skull: Negative

Sinuses/Orbits: Clear/normal

Other: None
IMPRESSION: Expected/resolving evolutionary findings related to previous
intraventricular hemorrhage. There is only a small amount residual
hyperdense blood products visible dependent in the occipital horns
of the lateral ventricles. The patient has not developed
hydrocephalus. Ventricular size is baseline.

Extensive atrophy, chronic small-vessel ischemic changes of the
white matter and old cortical and subcortical infarctions in the
right hemisphere as noted above.

## 2022-01-27 DIAGNOSIS — E119 Type 2 diabetes mellitus without complications: Secondary | ICD-10-CM | POA: Diagnosis not present

## 2022-01-27 DIAGNOSIS — E785 Hyperlipidemia, unspecified: Secondary | ICD-10-CM | POA: Diagnosis not present

## 2022-01-27 DIAGNOSIS — I639 Cerebral infarction, unspecified: Secondary | ICD-10-CM | POA: Diagnosis not present

## 2022-01-27 DIAGNOSIS — I10 Essential (primary) hypertension: Secondary | ICD-10-CM | POA: Diagnosis not present

## 2022-01-27 DIAGNOSIS — F0390 Unspecified dementia without behavioral disturbance: Secondary | ICD-10-CM | POA: Diagnosis not present

## 2022-02-02 DIAGNOSIS — Z5181 Encounter for therapeutic drug level monitoring: Secondary | ICD-10-CM | POA: Diagnosis not present

## 2022-02-02 DIAGNOSIS — L732 Hidradenitis suppurativa: Secondary | ICD-10-CM | POA: Diagnosis not present

## 2022-02-02 DIAGNOSIS — L304 Erythema intertrigo: Secondary | ICD-10-CM | POA: Diagnosis not present

## 2022-02-02 DIAGNOSIS — I10 Essential (primary) hypertension: Secondary | ICD-10-CM | POA: Diagnosis not present

## 2022-02-02 DIAGNOSIS — R059 Cough, unspecified: Secondary | ICD-10-CM | POA: Diagnosis not present

## 2022-02-02 DIAGNOSIS — R053 Chronic cough: Secondary | ICD-10-CM | POA: Diagnosis not present

## 2022-02-02 DIAGNOSIS — K219 Gastro-esophageal reflux disease without esophagitis: Secondary | ICD-10-CM | POA: Diagnosis not present

## 2022-02-02 DIAGNOSIS — E119 Type 2 diabetes mellitus without complications: Secondary | ICD-10-CM | POA: Diagnosis not present

## 2022-02-10 DIAGNOSIS — F32A Depression, unspecified: Secondary | ICD-10-CM | POA: Diagnosis not present

## 2022-02-10 DIAGNOSIS — F411 Generalized anxiety disorder: Secondary | ICD-10-CM | POA: Diagnosis not present

## 2022-02-10 DIAGNOSIS — F5105 Insomnia due to other mental disorder: Secondary | ICD-10-CM | POA: Diagnosis not present

## 2022-03-03 DIAGNOSIS — I509 Heart failure, unspecified: Secondary | ICD-10-CM | POA: Diagnosis not present

## 2022-03-03 DIAGNOSIS — F0390 Unspecified dementia without behavioral disturbance: Secondary | ICD-10-CM | POA: Diagnosis not present

## 2022-03-03 DIAGNOSIS — E119 Type 2 diabetes mellitus without complications: Secondary | ICD-10-CM | POA: Diagnosis not present

## 2022-03-03 DIAGNOSIS — I639 Cerebral infarction, unspecified: Secondary | ICD-10-CM | POA: Diagnosis not present

## 2022-03-03 DIAGNOSIS — E785 Hyperlipidemia, unspecified: Secondary | ICD-10-CM | POA: Diagnosis not present

## 2022-03-03 DIAGNOSIS — I10 Essential (primary) hypertension: Secondary | ICD-10-CM | POA: Diagnosis not present

## 2022-03-10 DIAGNOSIS — L732 Hidradenitis suppurativa: Secondary | ICD-10-CM | POA: Diagnosis not present

## 2022-03-10 DIAGNOSIS — E785 Hyperlipidemia, unspecified: Secondary | ICD-10-CM | POA: Diagnosis not present

## 2022-03-10 DIAGNOSIS — J189 Pneumonia, unspecified organism: Secondary | ICD-10-CM | POA: Diagnosis not present

## 2022-03-10 DIAGNOSIS — F411 Generalized anxiety disorder: Secondary | ICD-10-CM | POA: Diagnosis not present

## 2022-03-10 DIAGNOSIS — I639 Cerebral infarction, unspecified: Secondary | ICD-10-CM | POA: Diagnosis not present

## 2022-03-10 DIAGNOSIS — I10 Essential (primary) hypertension: Secondary | ICD-10-CM | POA: Diagnosis not present

## 2022-03-10 DIAGNOSIS — F32A Depression, unspecified: Secondary | ICD-10-CM | POA: Diagnosis not present

## 2022-03-10 DIAGNOSIS — E119 Type 2 diabetes mellitus without complications: Secondary | ICD-10-CM | POA: Diagnosis not present

## 2022-03-10 DIAGNOSIS — F5105 Insomnia due to other mental disorder: Secondary | ICD-10-CM | POA: Diagnosis not present

## 2022-03-16 DIAGNOSIS — J432 Centrilobular emphysema: Secondary | ICD-10-CM | POA: Diagnosis not present

## 2022-03-16 DIAGNOSIS — R053 Chronic cough: Secondary | ICD-10-CM | POA: Diagnosis not present

## 2022-03-16 DIAGNOSIS — Z87891 Personal history of nicotine dependence: Secondary | ICD-10-CM | POA: Diagnosis not present

## 2022-03-16 DIAGNOSIS — R059 Cough, unspecified: Secondary | ICD-10-CM | POA: Diagnosis not present

## 2022-03-16 DIAGNOSIS — E119 Type 2 diabetes mellitus without complications: Secondary | ICD-10-CM | POA: Diagnosis not present

## 2022-03-16 DIAGNOSIS — I10 Essential (primary) hypertension: Secondary | ICD-10-CM | POA: Diagnosis not present

## 2022-03-16 DIAGNOSIS — K219 Gastro-esophageal reflux disease without esophagitis: Secondary | ICD-10-CM | POA: Diagnosis not present

## 2022-03-22 DIAGNOSIS — J432 Centrilobular emphysema: Secondary | ICD-10-CM | POA: Diagnosis not present

## 2022-03-22 DIAGNOSIS — R053 Chronic cough: Secondary | ICD-10-CM | POA: Diagnosis not present

## 2022-04-04 DIAGNOSIS — R059 Cough, unspecified: Secondary | ICD-10-CM | POA: Diagnosis not present

## 2022-04-05 DIAGNOSIS — I639 Cerebral infarction, unspecified: Secondary | ICD-10-CM | POA: Diagnosis not present

## 2022-04-05 DIAGNOSIS — J209 Acute bronchitis, unspecified: Secondary | ICD-10-CM | POA: Diagnosis not present

## 2022-04-05 DIAGNOSIS — I10 Essential (primary) hypertension: Secondary | ICD-10-CM | POA: Diagnosis not present

## 2022-04-05 DIAGNOSIS — E119 Type 2 diabetes mellitus without complications: Secondary | ICD-10-CM | POA: Diagnosis not present

## 2022-04-05 DIAGNOSIS — E785 Hyperlipidemia, unspecified: Secondary | ICD-10-CM | POA: Diagnosis not present

## 2022-04-08 DIAGNOSIS — E1159 Type 2 diabetes mellitus with other circulatory complications: Secondary | ICD-10-CM | POA: Diagnosis not present

## 2022-04-08 DIAGNOSIS — B351 Tinea unguium: Secondary | ICD-10-CM | POA: Diagnosis not present

## 2022-04-14 DIAGNOSIS — F411 Generalized anxiety disorder: Secondary | ICD-10-CM | POA: Diagnosis not present

## 2022-04-14 DIAGNOSIS — I639 Cerebral infarction, unspecified: Secondary | ICD-10-CM | POA: Diagnosis not present

## 2022-04-14 DIAGNOSIS — F32A Depression, unspecified: Secondary | ICD-10-CM | POA: Diagnosis not present

## 2022-04-14 DIAGNOSIS — E785 Hyperlipidemia, unspecified: Secondary | ICD-10-CM | POA: Diagnosis not present

## 2022-04-14 DIAGNOSIS — I10 Essential (primary) hypertension: Secondary | ICD-10-CM | POA: Diagnosis not present

## 2022-04-14 DIAGNOSIS — E119 Type 2 diabetes mellitus without complications: Secondary | ICD-10-CM | POA: Diagnosis not present

## 2022-04-14 DIAGNOSIS — F5105 Insomnia due to other mental disorder: Secondary | ICD-10-CM | POA: Diagnosis not present

## 2022-04-14 DIAGNOSIS — F329 Major depressive disorder, single episode, unspecified: Secondary | ICD-10-CM | POA: Diagnosis not present

## 2022-04-20 DIAGNOSIS — L299 Pruritus, unspecified: Secondary | ICD-10-CM | POA: Diagnosis not present

## 2022-04-20 DIAGNOSIS — E785 Hyperlipidemia, unspecified: Secondary | ICD-10-CM | POA: Diagnosis not present

## 2022-04-20 DIAGNOSIS — I10 Essential (primary) hypertension: Secondary | ICD-10-CM | POA: Diagnosis not present

## 2022-04-20 DIAGNOSIS — I639 Cerebral infarction, unspecified: Secondary | ICD-10-CM | POA: Diagnosis not present

## 2022-04-20 DIAGNOSIS — E119 Type 2 diabetes mellitus without complications: Secondary | ICD-10-CM | POA: Diagnosis not present

## 2022-04-20 DIAGNOSIS — L732 Hidradenitis suppurativa: Secondary | ICD-10-CM | POA: Diagnosis not present

## 2022-05-04 DIAGNOSIS — L304 Erythema intertrigo: Secondary | ICD-10-CM | POA: Diagnosis not present

## 2022-05-04 DIAGNOSIS — Z5181 Encounter for therapeutic drug level monitoring: Secondary | ICD-10-CM | POA: Diagnosis not present

## 2022-05-04 DIAGNOSIS — L732 Hidradenitis suppurativa: Secondary | ICD-10-CM | POA: Diagnosis not present

## 2022-05-05 DIAGNOSIS — D641 Secondary sideroblastic anemia due to disease: Secondary | ICD-10-CM | POA: Diagnosis not present

## 2022-05-05 DIAGNOSIS — L732 Hidradenitis suppurativa: Secondary | ICD-10-CM | POA: Diagnosis not present

## 2022-05-05 DIAGNOSIS — K219 Gastro-esophageal reflux disease without esophagitis: Secondary | ICD-10-CM | POA: Diagnosis not present

## 2022-05-05 DIAGNOSIS — K7200 Acute and subacute hepatic failure without coma: Secondary | ICD-10-CM | POA: Diagnosis not present

## 2022-05-05 DIAGNOSIS — I10 Essential (primary) hypertension: Secondary | ICD-10-CM | POA: Diagnosis not present

## 2022-05-05 DIAGNOSIS — I639 Cerebral infarction, unspecified: Secondary | ICD-10-CM | POA: Diagnosis not present

## 2022-05-05 DIAGNOSIS — E119 Type 2 diabetes mellitus without complications: Secondary | ICD-10-CM | POA: Diagnosis not present

## 2022-05-07 DIAGNOSIS — I509 Heart failure, unspecified: Secondary | ICD-10-CM | POA: Diagnosis not present

## 2022-05-07 DIAGNOSIS — E785 Hyperlipidemia, unspecified: Secondary | ICD-10-CM | POA: Diagnosis not present

## 2022-05-07 DIAGNOSIS — E119 Type 2 diabetes mellitus without complications: Secondary | ICD-10-CM | POA: Diagnosis not present

## 2022-05-07 DIAGNOSIS — I10 Essential (primary) hypertension: Secondary | ICD-10-CM | POA: Diagnosis not present

## 2022-05-07 DIAGNOSIS — I639 Cerebral infarction, unspecified: Secondary | ICD-10-CM | POA: Diagnosis not present

## 2022-05-07 DIAGNOSIS — F0390 Unspecified dementia without behavioral disturbance: Secondary | ICD-10-CM | POA: Diagnosis not present

## 2022-05-18 DIAGNOSIS — E119 Type 2 diabetes mellitus without complications: Secondary | ICD-10-CM | POA: Diagnosis not present

## 2022-05-18 DIAGNOSIS — I639 Cerebral infarction, unspecified: Secondary | ICD-10-CM | POA: Diagnosis not present

## 2022-05-18 DIAGNOSIS — L732 Hidradenitis suppurativa: Secondary | ICD-10-CM | POA: Diagnosis not present

## 2022-05-18 DIAGNOSIS — K219 Gastro-esophageal reflux disease without esophagitis: Secondary | ICD-10-CM | POA: Diagnosis not present

## 2022-05-18 DIAGNOSIS — I10 Essential (primary) hypertension: Secondary | ICD-10-CM | POA: Diagnosis not present

## 2022-05-19 DIAGNOSIS — F5105 Insomnia due to other mental disorder: Secondary | ICD-10-CM | POA: Diagnosis not present

## 2022-05-19 DIAGNOSIS — F32A Depression, unspecified: Secondary | ICD-10-CM | POA: Diagnosis not present

## 2022-05-19 DIAGNOSIS — F411 Generalized anxiety disorder: Secondary | ICD-10-CM | POA: Diagnosis not present

## 2022-06-02 DIAGNOSIS — F5105 Insomnia due to other mental disorder: Secondary | ICD-10-CM | POA: Diagnosis not present

## 2022-06-02 DIAGNOSIS — F32A Depression, unspecified: Secondary | ICD-10-CM | POA: Diagnosis not present

## 2022-06-02 DIAGNOSIS — F411 Generalized anxiety disorder: Secondary | ICD-10-CM | POA: Diagnosis not present

## 2022-06-03 DIAGNOSIS — E119 Type 2 diabetes mellitus without complications: Secondary | ICD-10-CM | POA: Diagnosis not present

## 2022-06-03 DIAGNOSIS — E785 Hyperlipidemia, unspecified: Secondary | ICD-10-CM | POA: Diagnosis not present

## 2022-06-03 DIAGNOSIS — F419 Anxiety disorder, unspecified: Secondary | ICD-10-CM | POA: Diagnosis not present

## 2022-06-03 DIAGNOSIS — F329 Major depressive disorder, single episode, unspecified: Secondary | ICD-10-CM | POA: Diagnosis not present

## 2022-06-03 DIAGNOSIS — I509 Heart failure, unspecified: Secondary | ICD-10-CM | POA: Diagnosis not present

## 2022-06-03 DIAGNOSIS — I10 Essential (primary) hypertension: Secondary | ICD-10-CM | POA: Diagnosis not present

## 2022-06-03 DIAGNOSIS — I639 Cerebral infarction, unspecified: Secondary | ICD-10-CM | POA: Diagnosis not present

## 2022-06-03 DIAGNOSIS — F4323 Adjustment disorder with mixed anxiety and depressed mood: Secondary | ICD-10-CM | POA: Diagnosis not present

## 2022-06-17 DIAGNOSIS — E1159 Type 2 diabetes mellitus with other circulatory complications: Secondary | ICD-10-CM | POA: Diagnosis not present

## 2022-06-17 DIAGNOSIS — B351 Tinea unguium: Secondary | ICD-10-CM | POA: Diagnosis not present

## 2022-06-22 DIAGNOSIS — K219 Gastro-esophageal reflux disease without esophagitis: Secondary | ICD-10-CM | POA: Diagnosis not present

## 2022-06-22 DIAGNOSIS — I639 Cerebral infarction, unspecified: Secondary | ICD-10-CM | POA: Diagnosis not present

## 2022-06-22 DIAGNOSIS — L732 Hidradenitis suppurativa: Secondary | ICD-10-CM | POA: Diagnosis not present

## 2022-06-22 DIAGNOSIS — E119 Type 2 diabetes mellitus without complications: Secondary | ICD-10-CM | POA: Diagnosis not present

## 2022-06-22 DIAGNOSIS — I10 Essential (primary) hypertension: Secondary | ICD-10-CM | POA: Diagnosis not present

## 2022-06-25 DIAGNOSIS — L732 Hidradenitis suppurativa: Secondary | ICD-10-CM | POA: Diagnosis not present

## 2022-06-25 DIAGNOSIS — E785 Hyperlipidemia, unspecified: Secondary | ICD-10-CM | POA: Diagnosis not present

## 2022-06-25 DIAGNOSIS — K219 Gastro-esophageal reflux disease without esophagitis: Secondary | ICD-10-CM | POA: Diagnosis not present

## 2022-06-25 DIAGNOSIS — E119 Type 2 diabetes mellitus without complications: Secondary | ICD-10-CM | POA: Diagnosis not present

## 2022-06-29 DIAGNOSIS — F419 Anxiety disorder, unspecified: Secondary | ICD-10-CM | POA: Diagnosis not present

## 2022-06-29 DIAGNOSIS — I639 Cerebral infarction, unspecified: Secondary | ICD-10-CM | POA: Diagnosis not present

## 2022-06-29 DIAGNOSIS — E785 Hyperlipidemia, unspecified: Secondary | ICD-10-CM | POA: Diagnosis not present

## 2022-06-29 DIAGNOSIS — F329 Major depressive disorder, single episode, unspecified: Secondary | ICD-10-CM | POA: Diagnosis not present

## 2022-06-29 DIAGNOSIS — I509 Heart failure, unspecified: Secondary | ICD-10-CM | POA: Diagnosis not present

## 2022-06-29 DIAGNOSIS — F4323 Adjustment disorder with mixed anxiety and depressed mood: Secondary | ICD-10-CM | POA: Diagnosis not present

## 2022-06-29 DIAGNOSIS — I10 Essential (primary) hypertension: Secondary | ICD-10-CM | POA: Diagnosis not present

## 2022-06-29 DIAGNOSIS — E119 Type 2 diabetes mellitus without complications: Secondary | ICD-10-CM | POA: Diagnosis not present

## 2022-06-30 DIAGNOSIS — F32A Depression, unspecified: Secondary | ICD-10-CM | POA: Diagnosis not present

## 2022-06-30 DIAGNOSIS — F411 Generalized anxiety disorder: Secondary | ICD-10-CM | POA: Diagnosis not present

## 2022-06-30 DIAGNOSIS — F5105 Insomnia due to other mental disorder: Secondary | ICD-10-CM | POA: Diagnosis not present

## 2022-07-20 DIAGNOSIS — E785 Hyperlipidemia, unspecified: Secondary | ICD-10-CM | POA: Diagnosis not present

## 2022-07-20 DIAGNOSIS — L732 Hidradenitis suppurativa: Secondary | ICD-10-CM | POA: Diagnosis not present

## 2022-07-20 DIAGNOSIS — K219 Gastro-esophageal reflux disease without esophagitis: Secondary | ICD-10-CM | POA: Diagnosis not present

## 2022-07-20 DIAGNOSIS — I10 Essential (primary) hypertension: Secondary | ICD-10-CM | POA: Diagnosis not present

## 2022-07-20 DIAGNOSIS — E119 Type 2 diabetes mellitus without complications: Secondary | ICD-10-CM | POA: Diagnosis not present

## 2022-07-20 DIAGNOSIS — J209 Acute bronchitis, unspecified: Secondary | ICD-10-CM | POA: Diagnosis not present

## 2022-07-21 DIAGNOSIS — F32A Depression, unspecified: Secondary | ICD-10-CM | POA: Diagnosis not present

## 2022-07-21 DIAGNOSIS — F5105 Insomnia due to other mental disorder: Secondary | ICD-10-CM | POA: Diagnosis not present

## 2022-07-21 DIAGNOSIS — F411 Generalized anxiety disorder: Secondary | ICD-10-CM | POA: Diagnosis not present

## 2022-07-30 DIAGNOSIS — I639 Cerebral infarction, unspecified: Secondary | ICD-10-CM | POA: Diagnosis not present

## 2022-07-30 DIAGNOSIS — F419 Anxiety disorder, unspecified: Secondary | ICD-10-CM | POA: Diagnosis not present

## 2022-07-30 DIAGNOSIS — E119 Type 2 diabetes mellitus without complications: Secondary | ICD-10-CM | POA: Diagnosis not present

## 2022-07-30 DIAGNOSIS — J449 Chronic obstructive pulmonary disease, unspecified: Secondary | ICD-10-CM | POA: Diagnosis not present

## 2022-07-30 DIAGNOSIS — G8929 Other chronic pain: Secondary | ICD-10-CM | POA: Diagnosis not present

## 2022-07-30 DIAGNOSIS — F32A Depression, unspecified: Secondary | ICD-10-CM | POA: Diagnosis not present

## 2022-07-30 DIAGNOSIS — I10 Essential (primary) hypertension: Secondary | ICD-10-CM | POA: Diagnosis not present

## 2022-07-30 DIAGNOSIS — I509 Heart failure, unspecified: Secondary | ICD-10-CM | POA: Diagnosis not present

## 2022-07-30 DIAGNOSIS — K219 Gastro-esophageal reflux disease without esophagitis: Secondary | ICD-10-CM | POA: Diagnosis not present

## 2022-08-08 DIAGNOSIS — I110 Hypertensive heart disease with heart failure: Secondary | ICD-10-CM | POA: Diagnosis not present

## 2022-08-08 DIAGNOSIS — J439 Emphysema, unspecified: Secondary | ICD-10-CM | POA: Diagnosis not present

## 2022-08-08 DIAGNOSIS — I63341 Cerebral infarction due to thrombosis of right cerebellar artery: Secondary | ICD-10-CM | POA: Diagnosis not present

## 2022-08-08 DIAGNOSIS — R4701 Aphasia: Secondary | ICD-10-CM | POA: Diagnosis not present

## 2022-08-08 DIAGNOSIS — Z8673 Personal history of transient ischemic attack (TIA), and cerebral infarction without residual deficits: Secondary | ICD-10-CM | POA: Diagnosis not present

## 2022-08-08 DIAGNOSIS — I959 Hypotension, unspecified: Secondary | ICD-10-CM | POA: Diagnosis not present

## 2022-08-08 DIAGNOSIS — I6522 Occlusion and stenosis of left carotid artery: Secondary | ICD-10-CM | POA: Diagnosis not present

## 2022-08-08 DIAGNOSIS — E1136 Type 2 diabetes mellitus with diabetic cataract: Secondary | ICD-10-CM | POA: Diagnosis not present

## 2022-08-08 DIAGNOSIS — I6529 Occlusion and stenosis of unspecified carotid artery: Secondary | ICD-10-CM | POA: Diagnosis not present

## 2022-08-08 DIAGNOSIS — R739 Hyperglycemia, unspecified: Secondary | ICD-10-CM | POA: Diagnosis not present

## 2022-08-08 DIAGNOSIS — I2510 Atherosclerotic heart disease of native coronary artery without angina pectoris: Secondary | ICD-10-CM | POA: Diagnosis not present

## 2022-08-08 DIAGNOSIS — G459 Transient cerebral ischemic attack, unspecified: Secondary | ICD-10-CM | POA: Diagnosis not present

## 2022-08-08 DIAGNOSIS — I10 Essential (primary) hypertension: Secondary | ICD-10-CM | POA: Diagnosis not present

## 2022-08-08 DIAGNOSIS — R4781 Slurred speech: Secondary | ICD-10-CM | POA: Diagnosis not present

## 2022-08-08 DIAGNOSIS — J449 Chronic obstructive pulmonary disease, unspecified: Secondary | ICD-10-CM | POA: Diagnosis not present

## 2022-08-08 DIAGNOSIS — I509 Heart failure, unspecified: Secondary | ICD-10-CM | POA: Diagnosis not present

## 2022-08-09 DIAGNOSIS — I517 Cardiomegaly: Secondary | ICD-10-CM | POA: Diagnosis not present

## 2022-08-09 DIAGNOSIS — I358 Other nonrheumatic aortic valve disorders: Secondary | ICD-10-CM | POA: Diagnosis not present

## 2022-08-09 DIAGNOSIS — R9431 Abnormal electrocardiogram [ECG] [EKG]: Secondary | ICD-10-CM | POA: Diagnosis not present

## 2022-08-09 DIAGNOSIS — I6529 Occlusion and stenosis of unspecified carotid artery: Secondary | ICD-10-CM | POA: Diagnosis not present

## 2022-08-09 DIAGNOSIS — I6389 Other cerebral infarction: Secondary | ICD-10-CM | POA: Diagnosis not present

## 2022-08-09 DIAGNOSIS — R4781 Slurred speech: Secondary | ICD-10-CM | POA: Diagnosis not present

## 2022-08-10 DIAGNOSIS — R9089 Other abnormal findings on diagnostic imaging of central nervous system: Secondary | ICD-10-CM | POA: Diagnosis not present

## 2022-08-10 DIAGNOSIS — I639 Cerebral infarction, unspecified: Secondary | ICD-10-CM | POA: Diagnosis not present

## 2022-08-10 DIAGNOSIS — I6521 Occlusion and stenosis of right carotid artery: Secondary | ICD-10-CM | POA: Diagnosis not present

## 2022-08-10 DIAGNOSIS — I509 Heart failure, unspecified: Secondary | ICD-10-CM | POA: Diagnosis not present

## 2022-08-10 DIAGNOSIS — R5381 Other malaise: Secondary | ICD-10-CM | POA: Diagnosis not present

## 2022-08-10 DIAGNOSIS — N390 Urinary tract infection, site not specified: Secondary | ICD-10-CM | POA: Diagnosis not present

## 2022-08-10 DIAGNOSIS — G459 Transient cerebral ischemic attack, unspecified: Secondary | ICD-10-CM | POA: Diagnosis not present

## 2022-08-10 DIAGNOSIS — R471 Dysarthria and anarthria: Secondary | ICD-10-CM | POA: Diagnosis not present

## 2022-08-10 DIAGNOSIS — I10 Essential (primary) hypertension: Secondary | ICD-10-CM | POA: Diagnosis not present

## 2022-08-10 DIAGNOSIS — K219 Gastro-esophageal reflux disease without esophagitis: Secondary | ICD-10-CM | POA: Diagnosis not present

## 2022-08-11 DIAGNOSIS — I10 Essential (primary) hypertension: Secondary | ICD-10-CM | POA: Diagnosis not present

## 2022-08-11 DIAGNOSIS — M4807 Spinal stenosis, lumbosacral region: Secondary | ICD-10-CM | POA: Diagnosis not present

## 2022-08-11 DIAGNOSIS — J9601 Acute respiratory failure with hypoxia: Secondary | ICD-10-CM | POA: Diagnosis not present

## 2022-08-11 DIAGNOSIS — M25551 Pain in right hip: Secondary | ICD-10-CM | POA: Diagnosis not present

## 2022-08-11 DIAGNOSIS — R0902 Hypoxemia: Secondary | ICD-10-CM | POA: Diagnosis not present

## 2022-08-11 DIAGNOSIS — S32010D Wedge compression fracture of first lumbar vertebra, subsequent encounter for fracture with routine healing: Secondary | ICD-10-CM | POA: Diagnosis not present

## 2022-08-11 DIAGNOSIS — I5043 Acute on chronic combined systolic (congestive) and diastolic (congestive) heart failure: Secondary | ICD-10-CM | POA: Diagnosis not present

## 2022-08-11 DIAGNOSIS — M6281 Muscle weakness (generalized): Secondary | ICD-10-CM | POA: Diagnosis not present

## 2022-08-11 DIAGNOSIS — R2681 Unsteadiness on feet: Secondary | ICD-10-CM | POA: Diagnosis not present

## 2022-08-11 DIAGNOSIS — Z4789 Encounter for other orthopedic aftercare: Secondary | ICD-10-CM | POA: Diagnosis not present

## 2022-08-11 DIAGNOSIS — E119 Type 2 diabetes mellitus without complications: Secondary | ICD-10-CM | POA: Diagnosis not present

## 2022-08-11 DIAGNOSIS — I639 Cerebral infarction, unspecified: Secondary | ICD-10-CM | POA: Diagnosis not present

## 2022-08-11 DIAGNOSIS — L732 Hidradenitis suppurativa: Secondary | ICD-10-CM | POA: Diagnosis not present

## 2022-08-12 DIAGNOSIS — R2681 Unsteadiness on feet: Secondary | ICD-10-CM | POA: Diagnosis not present

## 2022-08-12 DIAGNOSIS — F329 Major depressive disorder, single episode, unspecified: Secondary | ICD-10-CM | POA: Diagnosis not present

## 2022-08-12 DIAGNOSIS — M6281 Muscle weakness (generalized): Secondary | ICD-10-CM | POA: Diagnosis not present

## 2022-08-12 DIAGNOSIS — I5043 Acute on chronic combined systolic (congestive) and diastolic (congestive) heart failure: Secondary | ICD-10-CM | POA: Diagnosis not present

## 2022-08-12 DIAGNOSIS — M25551 Pain in right hip: Secondary | ICD-10-CM | POA: Diagnosis not present

## 2022-08-12 DIAGNOSIS — R0902 Hypoxemia: Secondary | ICD-10-CM | POA: Diagnosis not present

## 2022-08-12 DIAGNOSIS — E119 Type 2 diabetes mellitus without complications: Secondary | ICD-10-CM | POA: Diagnosis not present

## 2022-08-12 DIAGNOSIS — L732 Hidradenitis suppurativa: Secondary | ICD-10-CM | POA: Diagnosis not present

## 2022-08-12 DIAGNOSIS — J9601 Acute respiratory failure with hypoxia: Secondary | ICD-10-CM | POA: Diagnosis not present

## 2022-08-12 DIAGNOSIS — I509 Heart failure, unspecified: Secondary | ICD-10-CM | POA: Diagnosis not present

## 2022-08-12 DIAGNOSIS — I639 Cerebral infarction, unspecified: Secondary | ICD-10-CM | POA: Diagnosis not present

## 2022-08-12 DIAGNOSIS — I10 Essential (primary) hypertension: Secondary | ICD-10-CM | POA: Diagnosis not present

## 2022-08-12 DIAGNOSIS — F419 Anxiety disorder, unspecified: Secondary | ICD-10-CM | POA: Diagnosis not present

## 2022-08-12 DIAGNOSIS — E785 Hyperlipidemia, unspecified: Secondary | ICD-10-CM | POA: Diagnosis not present

## 2022-08-12 DIAGNOSIS — Z4789 Encounter for other orthopedic aftercare: Secondary | ICD-10-CM | POA: Diagnosis not present

## 2022-08-13 DIAGNOSIS — D649 Anemia, unspecified: Secondary | ICD-10-CM | POA: Diagnosis not present

## 2022-08-13 DIAGNOSIS — D641 Secondary sideroblastic anemia due to disease: Secondary | ICD-10-CM | POA: Diagnosis not present

## 2022-08-13 DIAGNOSIS — L732 Hidradenitis suppurativa: Secondary | ICD-10-CM | POA: Diagnosis not present

## 2022-08-13 DIAGNOSIS — R2681 Unsteadiness on feet: Secondary | ICD-10-CM | POA: Diagnosis not present

## 2022-08-13 DIAGNOSIS — Z4789 Encounter for other orthopedic aftercare: Secondary | ICD-10-CM | POA: Diagnosis not present

## 2022-08-13 DIAGNOSIS — E119 Type 2 diabetes mellitus without complications: Secondary | ICD-10-CM | POA: Diagnosis not present

## 2022-08-13 DIAGNOSIS — I10 Essential (primary) hypertension: Secondary | ICD-10-CM | POA: Diagnosis not present

## 2022-08-13 DIAGNOSIS — M6281 Muscle weakness (generalized): Secondary | ICD-10-CM | POA: Diagnosis not present

## 2022-08-13 DIAGNOSIS — R0902 Hypoxemia: Secondary | ICD-10-CM | POA: Diagnosis not present

## 2022-08-13 DIAGNOSIS — M25551 Pain in right hip: Secondary | ICD-10-CM | POA: Diagnosis not present

## 2022-08-13 DIAGNOSIS — J9601 Acute respiratory failure with hypoxia: Secondary | ICD-10-CM | POA: Diagnosis not present

## 2022-08-13 DIAGNOSIS — I5043 Acute on chronic combined systolic (congestive) and diastolic (congestive) heart failure: Secondary | ICD-10-CM | POA: Diagnosis not present

## 2022-08-16 DIAGNOSIS — R2681 Unsteadiness on feet: Secondary | ICD-10-CM | POA: Diagnosis not present

## 2022-08-16 DIAGNOSIS — L732 Hidradenitis suppurativa: Secondary | ICD-10-CM | POA: Diagnosis not present

## 2022-08-16 DIAGNOSIS — Z8673 Personal history of transient ischemic attack (TIA), and cerebral infarction without residual deficits: Secondary | ICD-10-CM | POA: Diagnosis not present

## 2022-08-16 DIAGNOSIS — J9601 Acute respiratory failure with hypoxia: Secondary | ICD-10-CM | POA: Diagnosis not present

## 2022-08-16 DIAGNOSIS — I10 Essential (primary) hypertension: Secondary | ICD-10-CM | POA: Diagnosis not present

## 2022-08-16 DIAGNOSIS — J439 Emphysema, unspecified: Secondary | ICD-10-CM | POA: Diagnosis not present

## 2022-08-16 DIAGNOSIS — I6522 Occlusion and stenosis of left carotid artery: Secondary | ICD-10-CM | POA: Diagnosis not present

## 2022-08-16 DIAGNOSIS — R0902 Hypoxemia: Secondary | ICD-10-CM | POA: Diagnosis not present

## 2022-08-16 DIAGNOSIS — Z4789 Encounter for other orthopedic aftercare: Secondary | ICD-10-CM | POA: Diagnosis not present

## 2022-08-16 DIAGNOSIS — I493 Ventricular premature depolarization: Secondary | ICD-10-CM | POA: Diagnosis not present

## 2022-08-16 DIAGNOSIS — I499 Cardiac arrhythmia, unspecified: Secondary | ICD-10-CM | POA: Diagnosis not present

## 2022-08-16 DIAGNOSIS — E119 Type 2 diabetes mellitus without complications: Secondary | ICD-10-CM | POA: Diagnosis not present

## 2022-08-16 DIAGNOSIS — I471 Supraventricular tachycardia, unspecified: Secondary | ICD-10-CM | POA: Diagnosis not present

## 2022-08-16 DIAGNOSIS — M25551 Pain in right hip: Secondary | ICD-10-CM | POA: Diagnosis not present

## 2022-08-16 DIAGNOSIS — I5043 Acute on chronic combined systolic (congestive) and diastolic (congestive) heart failure: Secondary | ICD-10-CM | POA: Diagnosis not present

## 2022-08-16 DIAGNOSIS — M6281 Muscle weakness (generalized): Secondary | ICD-10-CM | POA: Diagnosis not present

## 2022-08-17 DIAGNOSIS — M6281 Muscle weakness (generalized): Secondary | ICD-10-CM | POA: Diagnosis not present

## 2022-08-17 DIAGNOSIS — E119 Type 2 diabetes mellitus without complications: Secondary | ICD-10-CM | POA: Diagnosis not present

## 2022-08-17 DIAGNOSIS — J9601 Acute respiratory failure with hypoxia: Secondary | ICD-10-CM | POA: Diagnosis not present

## 2022-08-17 DIAGNOSIS — R2681 Unsteadiness on feet: Secondary | ICD-10-CM | POA: Diagnosis not present

## 2022-08-17 DIAGNOSIS — L732 Hidradenitis suppurativa: Secondary | ICD-10-CM | POA: Diagnosis not present

## 2022-08-17 DIAGNOSIS — I5043 Acute on chronic combined systolic (congestive) and diastolic (congestive) heart failure: Secondary | ICD-10-CM | POA: Diagnosis not present

## 2022-08-17 DIAGNOSIS — M25551 Pain in right hip: Secondary | ICD-10-CM | POA: Diagnosis not present

## 2022-08-17 DIAGNOSIS — R0902 Hypoxemia: Secondary | ICD-10-CM | POA: Diagnosis not present

## 2022-08-17 DIAGNOSIS — Z4789 Encounter for other orthopedic aftercare: Secondary | ICD-10-CM | POA: Diagnosis not present

## 2022-08-18 DIAGNOSIS — R0902 Hypoxemia: Secondary | ICD-10-CM | POA: Diagnosis not present

## 2022-08-18 DIAGNOSIS — Z4789 Encounter for other orthopedic aftercare: Secondary | ICD-10-CM | POA: Diagnosis not present

## 2022-08-18 DIAGNOSIS — F411 Generalized anxiety disorder: Secondary | ICD-10-CM | POA: Diagnosis not present

## 2022-08-18 DIAGNOSIS — I5043 Acute on chronic combined systolic (congestive) and diastolic (congestive) heart failure: Secondary | ICD-10-CM | POA: Diagnosis not present

## 2022-08-18 DIAGNOSIS — J9601 Acute respiratory failure with hypoxia: Secondary | ICD-10-CM | POA: Diagnosis not present

## 2022-08-18 DIAGNOSIS — M6281 Muscle weakness (generalized): Secondary | ICD-10-CM | POA: Diagnosis not present

## 2022-08-18 DIAGNOSIS — F5105 Insomnia due to other mental disorder: Secondary | ICD-10-CM | POA: Diagnosis not present

## 2022-08-18 DIAGNOSIS — L732 Hidradenitis suppurativa: Secondary | ICD-10-CM | POA: Diagnosis not present

## 2022-08-18 DIAGNOSIS — E119 Type 2 diabetes mellitus without complications: Secondary | ICD-10-CM | POA: Diagnosis not present

## 2022-08-18 DIAGNOSIS — M25551 Pain in right hip: Secondary | ICD-10-CM | POA: Diagnosis not present

## 2022-08-18 DIAGNOSIS — F32A Depression, unspecified: Secondary | ICD-10-CM | POA: Diagnosis not present

## 2022-08-18 DIAGNOSIS — R2681 Unsteadiness on feet: Secondary | ICD-10-CM | POA: Diagnosis not present

## 2022-08-19 DIAGNOSIS — L732 Hidradenitis suppurativa: Secondary | ICD-10-CM | POA: Diagnosis not present

## 2022-08-19 DIAGNOSIS — J9601 Acute respiratory failure with hypoxia: Secondary | ICD-10-CM | POA: Diagnosis not present

## 2022-08-19 DIAGNOSIS — M6281 Muscle weakness (generalized): Secondary | ICD-10-CM | POA: Diagnosis not present

## 2022-08-19 DIAGNOSIS — E119 Type 2 diabetes mellitus without complications: Secondary | ICD-10-CM | POA: Diagnosis not present

## 2022-08-19 DIAGNOSIS — I5043 Acute on chronic combined systolic (congestive) and diastolic (congestive) heart failure: Secondary | ICD-10-CM | POA: Diagnosis not present

## 2022-08-19 DIAGNOSIS — R0902 Hypoxemia: Secondary | ICD-10-CM | POA: Diagnosis not present

## 2022-08-19 DIAGNOSIS — M25551 Pain in right hip: Secondary | ICD-10-CM | POA: Diagnosis not present

## 2022-08-19 DIAGNOSIS — R2681 Unsteadiness on feet: Secondary | ICD-10-CM | POA: Diagnosis not present

## 2022-08-19 DIAGNOSIS — Z4789 Encounter for other orthopedic aftercare: Secondary | ICD-10-CM | POA: Diagnosis not present

## 2022-08-20 DIAGNOSIS — Z4789 Encounter for other orthopedic aftercare: Secondary | ICD-10-CM | POA: Diagnosis not present

## 2022-08-20 DIAGNOSIS — J9601 Acute respiratory failure with hypoxia: Secondary | ICD-10-CM | POA: Diagnosis not present

## 2022-08-20 DIAGNOSIS — M6281 Muscle weakness (generalized): Secondary | ICD-10-CM | POA: Diagnosis not present

## 2022-08-20 DIAGNOSIS — R2681 Unsteadiness on feet: Secondary | ICD-10-CM | POA: Diagnosis not present

## 2022-08-20 DIAGNOSIS — E119 Type 2 diabetes mellitus without complications: Secondary | ICD-10-CM | POA: Diagnosis not present

## 2022-08-20 DIAGNOSIS — M25551 Pain in right hip: Secondary | ICD-10-CM | POA: Diagnosis not present

## 2022-08-20 DIAGNOSIS — R0902 Hypoxemia: Secondary | ICD-10-CM | POA: Diagnosis not present

## 2022-08-20 DIAGNOSIS — I5043 Acute on chronic combined systolic (congestive) and diastolic (congestive) heart failure: Secondary | ICD-10-CM | POA: Diagnosis not present

## 2022-08-20 DIAGNOSIS — L732 Hidradenitis suppurativa: Secondary | ICD-10-CM | POA: Diagnosis not present

## 2022-08-23 DIAGNOSIS — I5043 Acute on chronic combined systolic (congestive) and diastolic (congestive) heart failure: Secondary | ICD-10-CM | POA: Diagnosis not present

## 2022-08-23 DIAGNOSIS — E876 Hypokalemia: Secondary | ICD-10-CM | POA: Diagnosis not present

## 2022-08-23 DIAGNOSIS — R9431 Abnormal electrocardiogram [ECG] [EKG]: Secondary | ICD-10-CM | POA: Diagnosis not present

## 2022-08-23 DIAGNOSIS — E119 Type 2 diabetes mellitus without complications: Secondary | ICD-10-CM | POA: Diagnosis not present

## 2022-08-23 DIAGNOSIS — L732 Hidradenitis suppurativa: Secondary | ICD-10-CM | POA: Diagnosis not present

## 2022-08-23 DIAGNOSIS — F4320 Adjustment disorder, unspecified: Secondary | ICD-10-CM | POA: Diagnosis not present

## 2022-08-23 DIAGNOSIS — Z659 Problem related to unspecified psychosocial circumstances: Secondary | ICD-10-CM | POA: Diagnosis not present

## 2022-08-23 DIAGNOSIS — R5381 Other malaise: Secondary | ICD-10-CM | POA: Diagnosis not present

## 2022-08-23 DIAGNOSIS — M6281 Muscle weakness (generalized): Secondary | ICD-10-CM | POA: Diagnosis not present

## 2022-08-24 DIAGNOSIS — M6281 Muscle weakness (generalized): Secondary | ICD-10-CM | POA: Diagnosis not present

## 2022-08-24 DIAGNOSIS — L732 Hidradenitis suppurativa: Secondary | ICD-10-CM | POA: Diagnosis not present

## 2022-08-24 DIAGNOSIS — I5043 Acute on chronic combined systolic (congestive) and diastolic (congestive) heart failure: Secondary | ICD-10-CM | POA: Diagnosis not present

## 2022-08-24 DIAGNOSIS — E119 Type 2 diabetes mellitus without complications: Secondary | ICD-10-CM | POA: Diagnosis not present

## 2022-08-24 DIAGNOSIS — Z659 Problem related to unspecified psychosocial circumstances: Secondary | ICD-10-CM | POA: Diagnosis not present

## 2022-08-24 DIAGNOSIS — R5381 Other malaise: Secondary | ICD-10-CM | POA: Diagnosis not present

## 2022-08-24 DIAGNOSIS — R9431 Abnormal electrocardiogram [ECG] [EKG]: Secondary | ICD-10-CM | POA: Diagnosis not present

## 2022-08-24 DIAGNOSIS — F4320 Adjustment disorder, unspecified: Secondary | ICD-10-CM | POA: Diagnosis not present

## 2022-08-24 DIAGNOSIS — E876 Hypokalemia: Secondary | ICD-10-CM | POA: Diagnosis not present

## 2022-08-25 DIAGNOSIS — M6281 Muscle weakness (generalized): Secondary | ICD-10-CM | POA: Diagnosis not present

## 2022-08-25 DIAGNOSIS — I5043 Acute on chronic combined systolic (congestive) and diastolic (congestive) heart failure: Secondary | ICD-10-CM | POA: Diagnosis not present

## 2022-08-25 DIAGNOSIS — E119 Type 2 diabetes mellitus without complications: Secondary | ICD-10-CM | POA: Diagnosis not present

## 2022-08-25 DIAGNOSIS — L732 Hidradenitis suppurativa: Secondary | ICD-10-CM | POA: Diagnosis not present

## 2022-08-25 DIAGNOSIS — E876 Hypokalemia: Secondary | ICD-10-CM | POA: Diagnosis not present

## 2022-08-25 DIAGNOSIS — F4320 Adjustment disorder, unspecified: Secondary | ICD-10-CM | POA: Diagnosis not present

## 2022-08-25 DIAGNOSIS — Z659 Problem related to unspecified psychosocial circumstances: Secondary | ICD-10-CM | POA: Diagnosis not present

## 2022-08-25 DIAGNOSIS — R9431 Abnormal electrocardiogram [ECG] [EKG]: Secondary | ICD-10-CM | POA: Diagnosis not present

## 2022-08-25 DIAGNOSIS — R5381 Other malaise: Secondary | ICD-10-CM | POA: Diagnosis not present

## 2022-08-26 DIAGNOSIS — Z659 Problem related to unspecified psychosocial circumstances: Secondary | ICD-10-CM | POA: Diagnosis not present

## 2022-08-26 DIAGNOSIS — L732 Hidradenitis suppurativa: Secondary | ICD-10-CM | POA: Diagnosis not present

## 2022-08-26 DIAGNOSIS — I5043 Acute on chronic combined systolic (congestive) and diastolic (congestive) heart failure: Secondary | ICD-10-CM | POA: Diagnosis not present

## 2022-08-26 DIAGNOSIS — R5381 Other malaise: Secondary | ICD-10-CM | POA: Diagnosis not present

## 2022-08-26 DIAGNOSIS — M6281 Muscle weakness (generalized): Secondary | ICD-10-CM | POA: Diagnosis not present

## 2022-08-26 DIAGNOSIS — E119 Type 2 diabetes mellitus without complications: Secondary | ICD-10-CM | POA: Diagnosis not present

## 2022-08-26 DIAGNOSIS — F4320 Adjustment disorder, unspecified: Secondary | ICD-10-CM | POA: Diagnosis not present

## 2022-08-26 DIAGNOSIS — E876 Hypokalemia: Secondary | ICD-10-CM | POA: Diagnosis not present

## 2022-08-26 DIAGNOSIS — R9431 Abnormal electrocardiogram [ECG] [EKG]: Secondary | ICD-10-CM | POA: Diagnosis not present

## 2022-08-27 DIAGNOSIS — Z659 Problem related to unspecified psychosocial circumstances: Secondary | ICD-10-CM | POA: Diagnosis not present

## 2022-08-27 DIAGNOSIS — E876 Hypokalemia: Secondary | ICD-10-CM | POA: Diagnosis not present

## 2022-08-27 DIAGNOSIS — M6281 Muscle weakness (generalized): Secondary | ICD-10-CM | POA: Diagnosis not present

## 2022-08-27 DIAGNOSIS — E119 Type 2 diabetes mellitus without complications: Secondary | ICD-10-CM | POA: Diagnosis not present

## 2022-08-27 DIAGNOSIS — R9431 Abnormal electrocardiogram [ECG] [EKG]: Secondary | ICD-10-CM | POA: Diagnosis not present

## 2022-08-27 DIAGNOSIS — I5043 Acute on chronic combined systolic (congestive) and diastolic (congestive) heart failure: Secondary | ICD-10-CM | POA: Diagnosis not present

## 2022-08-27 DIAGNOSIS — R5381 Other malaise: Secondary | ICD-10-CM | POA: Diagnosis not present

## 2022-08-27 DIAGNOSIS — F4320 Adjustment disorder, unspecified: Secondary | ICD-10-CM | POA: Diagnosis not present

## 2022-08-27 DIAGNOSIS — L732 Hidradenitis suppurativa: Secondary | ICD-10-CM | POA: Diagnosis not present

## 2022-10-26 DIAGNOSIS — D519 Vitamin B12 deficiency anemia, unspecified: Secondary | ICD-10-CM | POA: Diagnosis not present

## 2022-10-26 DIAGNOSIS — E785 Hyperlipidemia, unspecified: Secondary | ICD-10-CM | POA: Diagnosis not present

## 2022-10-26 DIAGNOSIS — G4089 Other seizures: Secondary | ICD-10-CM | POA: Diagnosis not present

## 2022-10-26 DIAGNOSIS — I5043 Acute on chronic combined systolic (congestive) and diastolic (congestive) heart failure: Secondary | ICD-10-CM | POA: Diagnosis not present

## 2022-10-26 DIAGNOSIS — E119 Type 2 diabetes mellitus without complications: Secondary | ICD-10-CM | POA: Diagnosis not present

## 2022-10-26 DIAGNOSIS — E876 Hypokalemia: Secondary | ICD-10-CM | POA: Diagnosis not present

## 2022-10-26 DIAGNOSIS — M48061 Spinal stenosis, lumbar region without neurogenic claudication: Secondary | ICD-10-CM | POA: Diagnosis not present

## 2022-10-26 DIAGNOSIS — M5136 Other intervertebral disc degeneration, lumbar region: Secondary | ICD-10-CM | POA: Diagnosis not present

## 2022-10-26 DIAGNOSIS — L732 Hidradenitis suppurativa: Secondary | ICD-10-CM | POA: Diagnosis not present

## 2022-10-27 DIAGNOSIS — I5043 Acute on chronic combined systolic (congestive) and diastolic (congestive) heart failure: Secondary | ICD-10-CM | POA: Diagnosis not present

## 2022-10-27 DIAGNOSIS — D519 Vitamin B12 deficiency anemia, unspecified: Secondary | ICD-10-CM | POA: Diagnosis not present

## 2022-10-27 DIAGNOSIS — E876 Hypokalemia: Secondary | ICD-10-CM | POA: Diagnosis not present

## 2022-10-27 DIAGNOSIS — E785 Hyperlipidemia, unspecified: Secondary | ICD-10-CM | POA: Diagnosis not present

## 2022-10-27 DIAGNOSIS — L732 Hidradenitis suppurativa: Secondary | ICD-10-CM | POA: Diagnosis not present

## 2022-10-27 DIAGNOSIS — E119 Type 2 diabetes mellitus without complications: Secondary | ICD-10-CM | POA: Diagnosis not present

## 2022-10-27 DIAGNOSIS — M48061 Spinal stenosis, lumbar region without neurogenic claudication: Secondary | ICD-10-CM | POA: Diagnosis not present

## 2022-10-27 DIAGNOSIS — M5136 Other intervertebral disc degeneration, lumbar region: Secondary | ICD-10-CM | POA: Diagnosis not present

## 2022-10-27 DIAGNOSIS — G4089 Other seizures: Secondary | ICD-10-CM | POA: Diagnosis not present

## 2022-10-28 DIAGNOSIS — M48061 Spinal stenosis, lumbar region without neurogenic claudication: Secondary | ICD-10-CM | POA: Diagnosis not present

## 2022-10-28 DIAGNOSIS — G4089 Other seizures: Secondary | ICD-10-CM | POA: Diagnosis not present

## 2022-10-28 DIAGNOSIS — L732 Hidradenitis suppurativa: Secondary | ICD-10-CM | POA: Diagnosis not present

## 2022-10-28 DIAGNOSIS — M5136 Other intervertebral disc degeneration, lumbar region: Secondary | ICD-10-CM | POA: Diagnosis not present

## 2022-10-28 DIAGNOSIS — D519 Vitamin B12 deficiency anemia, unspecified: Secondary | ICD-10-CM | POA: Diagnosis not present

## 2022-10-28 DIAGNOSIS — E785 Hyperlipidemia, unspecified: Secondary | ICD-10-CM | POA: Diagnosis not present

## 2022-10-28 DIAGNOSIS — I5043 Acute on chronic combined systolic (congestive) and diastolic (congestive) heart failure: Secondary | ICD-10-CM | POA: Diagnosis not present

## 2022-10-28 DIAGNOSIS — E876 Hypokalemia: Secondary | ICD-10-CM | POA: Diagnosis not present

## 2022-10-28 DIAGNOSIS — E119 Type 2 diabetes mellitus without complications: Secondary | ICD-10-CM | POA: Diagnosis not present

## 2022-10-29 DIAGNOSIS — D519 Vitamin B12 deficiency anemia, unspecified: Secondary | ICD-10-CM | POA: Diagnosis not present

## 2022-10-29 DIAGNOSIS — L732 Hidradenitis suppurativa: Secondary | ICD-10-CM | POA: Diagnosis not present

## 2022-10-29 DIAGNOSIS — E876 Hypokalemia: Secondary | ICD-10-CM | POA: Diagnosis not present

## 2022-10-29 DIAGNOSIS — G4089 Other seizures: Secondary | ICD-10-CM | POA: Diagnosis not present

## 2022-10-29 DIAGNOSIS — E119 Type 2 diabetes mellitus without complications: Secondary | ICD-10-CM | POA: Diagnosis not present

## 2022-10-29 DIAGNOSIS — M5136 Other intervertebral disc degeneration, lumbar region: Secondary | ICD-10-CM | POA: Diagnosis not present

## 2022-10-29 DIAGNOSIS — M48061 Spinal stenosis, lumbar region without neurogenic claudication: Secondary | ICD-10-CM | POA: Diagnosis not present

## 2022-10-29 DIAGNOSIS — I5043 Acute on chronic combined systolic (congestive) and diastolic (congestive) heart failure: Secondary | ICD-10-CM | POA: Diagnosis not present

## 2022-10-29 DIAGNOSIS — E785 Hyperlipidemia, unspecified: Secondary | ICD-10-CM | POA: Diagnosis not present

## 2022-11-01 DIAGNOSIS — E785 Hyperlipidemia, unspecified: Secondary | ICD-10-CM | POA: Diagnosis not present

## 2022-11-01 DIAGNOSIS — M5136 Other intervertebral disc degeneration, lumbar region: Secondary | ICD-10-CM | POA: Diagnosis not present

## 2022-11-01 DIAGNOSIS — E119 Type 2 diabetes mellitus without complications: Secondary | ICD-10-CM | POA: Diagnosis not present

## 2022-11-01 DIAGNOSIS — E876 Hypokalemia: Secondary | ICD-10-CM | POA: Diagnosis not present

## 2022-11-01 DIAGNOSIS — G4089 Other seizures: Secondary | ICD-10-CM | POA: Diagnosis not present

## 2022-11-01 DIAGNOSIS — M48061 Spinal stenosis, lumbar region without neurogenic claudication: Secondary | ICD-10-CM | POA: Diagnosis not present

## 2022-11-01 DIAGNOSIS — D519 Vitamin B12 deficiency anemia, unspecified: Secondary | ICD-10-CM | POA: Diagnosis not present

## 2022-11-01 DIAGNOSIS — I5043 Acute on chronic combined systolic (congestive) and diastolic (congestive) heart failure: Secondary | ICD-10-CM | POA: Diagnosis not present

## 2022-11-01 DIAGNOSIS — L732 Hidradenitis suppurativa: Secondary | ICD-10-CM | POA: Diagnosis not present

## 2022-11-02 DIAGNOSIS — M48061 Spinal stenosis, lumbar region without neurogenic claudication: Secondary | ICD-10-CM | POA: Diagnosis not present

## 2022-11-02 DIAGNOSIS — M5136 Other intervertebral disc degeneration, lumbar region: Secondary | ICD-10-CM | POA: Diagnosis not present

## 2022-11-02 DIAGNOSIS — G4089 Other seizures: Secondary | ICD-10-CM | POA: Diagnosis not present

## 2022-11-02 DIAGNOSIS — E119 Type 2 diabetes mellitus without complications: Secondary | ICD-10-CM | POA: Diagnosis not present

## 2022-11-02 DIAGNOSIS — L732 Hidradenitis suppurativa: Secondary | ICD-10-CM | POA: Diagnosis not present

## 2022-11-02 DIAGNOSIS — D519 Vitamin B12 deficiency anemia, unspecified: Secondary | ICD-10-CM | POA: Diagnosis not present

## 2022-11-02 DIAGNOSIS — E876 Hypokalemia: Secondary | ICD-10-CM | POA: Diagnosis not present

## 2022-11-02 DIAGNOSIS — E785 Hyperlipidemia, unspecified: Secondary | ICD-10-CM | POA: Diagnosis not present

## 2022-11-02 DIAGNOSIS — I5043 Acute on chronic combined systolic (congestive) and diastolic (congestive) heart failure: Secondary | ICD-10-CM | POA: Diagnosis not present

## 2022-11-03 DIAGNOSIS — L732 Hidradenitis suppurativa: Secondary | ICD-10-CM | POA: Diagnosis not present

## 2022-11-03 DIAGNOSIS — M5136 Other intervertebral disc degeneration, lumbar region: Secondary | ICD-10-CM | POA: Diagnosis not present

## 2022-11-03 DIAGNOSIS — M48061 Spinal stenosis, lumbar region without neurogenic claudication: Secondary | ICD-10-CM | POA: Diagnosis not present

## 2022-11-03 DIAGNOSIS — E785 Hyperlipidemia, unspecified: Secondary | ICD-10-CM | POA: Diagnosis not present

## 2022-11-03 DIAGNOSIS — E876 Hypokalemia: Secondary | ICD-10-CM | POA: Diagnosis not present

## 2022-11-03 DIAGNOSIS — G4089 Other seizures: Secondary | ICD-10-CM | POA: Diagnosis not present

## 2022-11-03 DIAGNOSIS — D519 Vitamin B12 deficiency anemia, unspecified: Secondary | ICD-10-CM | POA: Diagnosis not present

## 2022-11-03 DIAGNOSIS — E119 Type 2 diabetes mellitus without complications: Secondary | ICD-10-CM | POA: Diagnosis not present

## 2022-11-03 DIAGNOSIS — I5043 Acute on chronic combined systolic (congestive) and diastolic (congestive) heart failure: Secondary | ICD-10-CM | POA: Diagnosis not present

## 2022-11-04 DIAGNOSIS — E119 Type 2 diabetes mellitus without complications: Secondary | ICD-10-CM | POA: Diagnosis not present

## 2022-11-04 DIAGNOSIS — L732 Hidradenitis suppurativa: Secondary | ICD-10-CM | POA: Diagnosis not present

## 2022-11-04 DIAGNOSIS — M5136 Other intervertebral disc degeneration, lumbar region: Secondary | ICD-10-CM | POA: Diagnosis not present

## 2022-11-04 DIAGNOSIS — D519 Vitamin B12 deficiency anemia, unspecified: Secondary | ICD-10-CM | POA: Diagnosis not present

## 2022-11-04 DIAGNOSIS — E876 Hypokalemia: Secondary | ICD-10-CM | POA: Diagnosis not present

## 2022-11-04 DIAGNOSIS — G4089 Other seizures: Secondary | ICD-10-CM | POA: Diagnosis not present

## 2022-11-04 DIAGNOSIS — E785 Hyperlipidemia, unspecified: Secondary | ICD-10-CM | POA: Diagnosis not present

## 2022-11-04 DIAGNOSIS — I5043 Acute on chronic combined systolic (congestive) and diastolic (congestive) heart failure: Secondary | ICD-10-CM | POA: Diagnosis not present

## 2022-11-04 DIAGNOSIS — M48061 Spinal stenosis, lumbar region without neurogenic claudication: Secondary | ICD-10-CM | POA: Diagnosis not present

## 2022-11-05 DIAGNOSIS — M48061 Spinal stenosis, lumbar region without neurogenic claudication: Secondary | ICD-10-CM | POA: Diagnosis not present

## 2022-11-05 DIAGNOSIS — D519 Vitamin B12 deficiency anemia, unspecified: Secondary | ICD-10-CM | POA: Diagnosis not present

## 2022-11-05 DIAGNOSIS — L732 Hidradenitis suppurativa: Secondary | ICD-10-CM | POA: Diagnosis not present

## 2022-11-05 DIAGNOSIS — I5043 Acute on chronic combined systolic (congestive) and diastolic (congestive) heart failure: Secondary | ICD-10-CM | POA: Diagnosis not present

## 2022-11-05 DIAGNOSIS — G4089 Other seizures: Secondary | ICD-10-CM | POA: Diagnosis not present

## 2022-11-05 DIAGNOSIS — M5136 Other intervertebral disc degeneration, lumbar region: Secondary | ICD-10-CM | POA: Diagnosis not present

## 2022-11-05 DIAGNOSIS — E119 Type 2 diabetes mellitus without complications: Secondary | ICD-10-CM | POA: Diagnosis not present

## 2022-11-05 DIAGNOSIS — E876 Hypokalemia: Secondary | ICD-10-CM | POA: Diagnosis not present

## 2022-11-05 DIAGNOSIS — E785 Hyperlipidemia, unspecified: Secondary | ICD-10-CM | POA: Diagnosis not present

## 2022-11-08 DIAGNOSIS — E785 Hyperlipidemia, unspecified: Secondary | ICD-10-CM | POA: Diagnosis not present

## 2022-11-08 DIAGNOSIS — M48061 Spinal stenosis, lumbar region without neurogenic claudication: Secondary | ICD-10-CM | POA: Diagnosis not present

## 2022-11-08 DIAGNOSIS — D519 Vitamin B12 deficiency anemia, unspecified: Secondary | ICD-10-CM | POA: Diagnosis not present

## 2022-11-08 DIAGNOSIS — E119 Type 2 diabetes mellitus without complications: Secondary | ICD-10-CM | POA: Diagnosis not present

## 2022-11-08 DIAGNOSIS — I5043 Acute on chronic combined systolic (congestive) and diastolic (congestive) heart failure: Secondary | ICD-10-CM | POA: Diagnosis not present

## 2022-11-08 DIAGNOSIS — E876 Hypokalemia: Secondary | ICD-10-CM | POA: Diagnosis not present

## 2022-11-08 DIAGNOSIS — G4089 Other seizures: Secondary | ICD-10-CM | POA: Diagnosis not present

## 2022-11-08 DIAGNOSIS — M5136 Other intervertebral disc degeneration, lumbar region: Secondary | ICD-10-CM | POA: Diagnosis not present

## 2022-11-08 DIAGNOSIS — L732 Hidradenitis suppurativa: Secondary | ICD-10-CM | POA: Diagnosis not present

## 2023-01-18 DIAGNOSIS — M6281 Muscle weakness (generalized): Secondary | ICD-10-CM | POA: Diagnosis not present

## 2023-01-18 DIAGNOSIS — E119 Type 2 diabetes mellitus without complications: Secondary | ICD-10-CM | POA: Diagnosis not present

## 2023-01-18 DIAGNOSIS — I5043 Acute on chronic combined systolic (congestive) and diastolic (congestive) heart failure: Secondary | ICD-10-CM | POA: Diagnosis not present

## 2023-01-18 DIAGNOSIS — L732 Hidradenitis suppurativa: Secondary | ICD-10-CM | POA: Diagnosis not present

## 2023-01-19 DIAGNOSIS — E119 Type 2 diabetes mellitus without complications: Secondary | ICD-10-CM | POA: Diagnosis not present

## 2023-01-19 DIAGNOSIS — L732 Hidradenitis suppurativa: Secondary | ICD-10-CM | POA: Diagnosis not present

## 2023-01-19 DIAGNOSIS — I5043 Acute on chronic combined systolic (congestive) and diastolic (congestive) heart failure: Secondary | ICD-10-CM | POA: Diagnosis not present

## 2023-01-19 DIAGNOSIS — M6281 Muscle weakness (generalized): Secondary | ICD-10-CM | POA: Diagnosis not present

## 2023-01-21 DIAGNOSIS — I5043 Acute on chronic combined systolic (congestive) and diastolic (congestive) heart failure: Secondary | ICD-10-CM | POA: Diagnosis not present

## 2023-01-21 DIAGNOSIS — E119 Type 2 diabetes mellitus without complications: Secondary | ICD-10-CM | POA: Diagnosis not present

## 2023-01-21 DIAGNOSIS — M6281 Muscle weakness (generalized): Secondary | ICD-10-CM | POA: Diagnosis not present

## 2023-01-21 DIAGNOSIS — L732 Hidradenitis suppurativa: Secondary | ICD-10-CM | POA: Diagnosis not present

## 2023-01-23 DIAGNOSIS — M6281 Muscle weakness (generalized): Secondary | ICD-10-CM | POA: Diagnosis not present

## 2023-01-23 DIAGNOSIS — R2689 Other abnormalities of gait and mobility: Secondary | ICD-10-CM | POA: Diagnosis not present

## 2023-01-24 DIAGNOSIS — R2689 Other abnormalities of gait and mobility: Secondary | ICD-10-CM | POA: Diagnosis not present

## 2023-01-24 DIAGNOSIS — M6281 Muscle weakness (generalized): Secondary | ICD-10-CM | POA: Diagnosis not present

## 2023-01-25 DIAGNOSIS — M6281 Muscle weakness (generalized): Secondary | ICD-10-CM | POA: Diagnosis not present

## 2023-01-25 DIAGNOSIS — R2689 Other abnormalities of gait and mobility: Secondary | ICD-10-CM | POA: Diagnosis not present

## 2023-01-26 DIAGNOSIS — R2689 Other abnormalities of gait and mobility: Secondary | ICD-10-CM | POA: Diagnosis not present

## 2023-01-26 DIAGNOSIS — M6281 Muscle weakness (generalized): Secondary | ICD-10-CM | POA: Diagnosis not present

## 2023-01-27 DIAGNOSIS — M6281 Muscle weakness (generalized): Secondary | ICD-10-CM | POA: Diagnosis not present

## 2023-01-27 DIAGNOSIS — R2689 Other abnormalities of gait and mobility: Secondary | ICD-10-CM | POA: Diagnosis not present

## 2023-01-28 DIAGNOSIS — M6281 Muscle weakness (generalized): Secondary | ICD-10-CM | POA: Diagnosis not present

## 2023-01-28 DIAGNOSIS — R2689 Other abnormalities of gait and mobility: Secondary | ICD-10-CM | POA: Diagnosis not present

## 2023-01-31 DIAGNOSIS — M6281 Muscle weakness (generalized): Secondary | ICD-10-CM | POA: Diagnosis not present

## 2023-01-31 DIAGNOSIS — R2689 Other abnormalities of gait and mobility: Secondary | ICD-10-CM | POA: Diagnosis not present

## 2023-02-01 DIAGNOSIS — M6281 Muscle weakness (generalized): Secondary | ICD-10-CM | POA: Diagnosis not present

## 2023-02-01 DIAGNOSIS — R2689 Other abnormalities of gait and mobility: Secondary | ICD-10-CM | POA: Diagnosis not present

## 2023-02-02 DIAGNOSIS — R2689 Other abnormalities of gait and mobility: Secondary | ICD-10-CM | POA: Diagnosis not present

## 2023-02-02 DIAGNOSIS — M6281 Muscle weakness (generalized): Secondary | ICD-10-CM | POA: Diagnosis not present

## 2023-02-03 DIAGNOSIS — R2689 Other abnormalities of gait and mobility: Secondary | ICD-10-CM | POA: Diagnosis not present

## 2023-02-03 DIAGNOSIS — M6281 Muscle weakness (generalized): Secondary | ICD-10-CM | POA: Diagnosis not present

## 2023-02-04 DIAGNOSIS — M6281 Muscle weakness (generalized): Secondary | ICD-10-CM | POA: Diagnosis not present

## 2023-02-04 DIAGNOSIS — R2689 Other abnormalities of gait and mobility: Secondary | ICD-10-CM | POA: Diagnosis not present

## 2023-02-07 DIAGNOSIS — R2689 Other abnormalities of gait and mobility: Secondary | ICD-10-CM | POA: Diagnosis not present

## 2023-02-07 DIAGNOSIS — M6281 Muscle weakness (generalized): Secondary | ICD-10-CM | POA: Diagnosis not present

## 2023-02-08 DIAGNOSIS — M6281 Muscle weakness (generalized): Secondary | ICD-10-CM | POA: Diagnosis not present

## 2023-02-08 DIAGNOSIS — R2689 Other abnormalities of gait and mobility: Secondary | ICD-10-CM | POA: Diagnosis not present

## 2023-03-01 DIAGNOSIS — M6281 Muscle weakness (generalized): Secondary | ICD-10-CM | POA: Diagnosis not present

## 2023-03-01 DIAGNOSIS — R1312 Dysphagia, oropharyngeal phase: Secondary | ICD-10-CM | POA: Diagnosis not present

## 2023-03-01 DIAGNOSIS — R2689 Other abnormalities of gait and mobility: Secondary | ICD-10-CM | POA: Diagnosis not present

## 2023-03-02 DIAGNOSIS — M6281 Muscle weakness (generalized): Secondary | ICD-10-CM | POA: Diagnosis not present

## 2023-03-02 DIAGNOSIS — R1312 Dysphagia, oropharyngeal phase: Secondary | ICD-10-CM | POA: Diagnosis not present

## 2023-03-02 DIAGNOSIS — R2689 Other abnormalities of gait and mobility: Secondary | ICD-10-CM | POA: Diagnosis not present

## 2023-03-03 DIAGNOSIS — M6281 Muscle weakness (generalized): Secondary | ICD-10-CM | POA: Diagnosis not present

## 2023-03-03 DIAGNOSIS — R2689 Other abnormalities of gait and mobility: Secondary | ICD-10-CM | POA: Diagnosis not present

## 2023-03-03 DIAGNOSIS — R1312 Dysphagia, oropharyngeal phase: Secondary | ICD-10-CM | POA: Diagnosis not present

## 2023-03-08 DIAGNOSIS — M6281 Muscle weakness (generalized): Secondary | ICD-10-CM | POA: Diagnosis not present

## 2023-03-08 DIAGNOSIS — R2689 Other abnormalities of gait and mobility: Secondary | ICD-10-CM | POA: Diagnosis not present

## 2023-03-08 DIAGNOSIS — R1312 Dysphagia, oropharyngeal phase: Secondary | ICD-10-CM | POA: Diagnosis not present

## 2023-03-09 DIAGNOSIS — R1312 Dysphagia, oropharyngeal phase: Secondary | ICD-10-CM | POA: Diagnosis not present

## 2023-03-09 DIAGNOSIS — R2689 Other abnormalities of gait and mobility: Secondary | ICD-10-CM | POA: Diagnosis not present

## 2023-03-09 DIAGNOSIS — M6281 Muscle weakness (generalized): Secondary | ICD-10-CM | POA: Diagnosis not present

## 2023-03-10 DIAGNOSIS — R2689 Other abnormalities of gait and mobility: Secondary | ICD-10-CM | POA: Diagnosis not present

## 2023-03-10 DIAGNOSIS — R1312 Dysphagia, oropharyngeal phase: Secondary | ICD-10-CM | POA: Diagnosis not present

## 2023-03-10 DIAGNOSIS — M6281 Muscle weakness (generalized): Secondary | ICD-10-CM | POA: Diagnosis not present

## 2023-03-20 DIAGNOSIS — M6281 Muscle weakness (generalized): Secondary | ICD-10-CM | POA: Diagnosis not present

## 2023-03-20 DIAGNOSIS — R1312 Dysphagia, oropharyngeal phase: Secondary | ICD-10-CM | POA: Diagnosis not present

## 2023-03-20 DIAGNOSIS — R2689 Other abnormalities of gait and mobility: Secondary | ICD-10-CM | POA: Diagnosis not present

## 2023-03-21 DIAGNOSIS — R1312 Dysphagia, oropharyngeal phase: Secondary | ICD-10-CM | POA: Diagnosis not present

## 2023-03-21 DIAGNOSIS — M6281 Muscle weakness (generalized): Secondary | ICD-10-CM | POA: Diagnosis not present

## 2023-03-21 DIAGNOSIS — R2689 Other abnormalities of gait and mobility: Secondary | ICD-10-CM | POA: Diagnosis not present

## 2023-03-22 DIAGNOSIS — R1312 Dysphagia, oropharyngeal phase: Secondary | ICD-10-CM | POA: Diagnosis not present

## 2023-03-22 DIAGNOSIS — R2689 Other abnormalities of gait and mobility: Secondary | ICD-10-CM | POA: Diagnosis not present

## 2023-03-22 DIAGNOSIS — M6281 Muscle weakness (generalized): Secondary | ICD-10-CM | POA: Diagnosis not present

## 2023-03-23 DIAGNOSIS — R2689 Other abnormalities of gait and mobility: Secondary | ICD-10-CM | POA: Diagnosis not present

## 2023-03-23 DIAGNOSIS — M6281 Muscle weakness (generalized): Secondary | ICD-10-CM | POA: Diagnosis not present

## 2023-03-23 DIAGNOSIS — R1312 Dysphagia, oropharyngeal phase: Secondary | ICD-10-CM | POA: Diagnosis not present

## 2023-03-24 DIAGNOSIS — R1312 Dysphagia, oropharyngeal phase: Secondary | ICD-10-CM | POA: Diagnosis not present

## 2023-03-24 DIAGNOSIS — R2689 Other abnormalities of gait and mobility: Secondary | ICD-10-CM | POA: Diagnosis not present

## 2023-03-24 DIAGNOSIS — M6281 Muscle weakness (generalized): Secondary | ICD-10-CM | POA: Diagnosis not present

## 2023-03-27 DIAGNOSIS — R2681 Unsteadiness on feet: Secondary | ICD-10-CM | POA: Diagnosis not present

## 2023-03-27 DIAGNOSIS — M6281 Muscle weakness (generalized): Secondary | ICD-10-CM | POA: Diagnosis not present

## 2023-03-27 DIAGNOSIS — R1312 Dysphagia, oropharyngeal phase: Secondary | ICD-10-CM | POA: Diagnosis not present

## 2023-03-27 DIAGNOSIS — R2689 Other abnormalities of gait and mobility: Secondary | ICD-10-CM | POA: Diagnosis not present

## 2023-03-27 DIAGNOSIS — R41841 Cognitive communication deficit: Secondary | ICD-10-CM | POA: Diagnosis not present

## 2023-03-29 DIAGNOSIS — R2689 Other abnormalities of gait and mobility: Secondary | ICD-10-CM | POA: Diagnosis not present

## 2023-03-29 DIAGNOSIS — R41841 Cognitive communication deficit: Secondary | ICD-10-CM | POA: Diagnosis not present

## 2023-03-29 DIAGNOSIS — R1312 Dysphagia, oropharyngeal phase: Secondary | ICD-10-CM | POA: Diagnosis not present

## 2023-03-29 DIAGNOSIS — M6281 Muscle weakness (generalized): Secondary | ICD-10-CM | POA: Diagnosis not present

## 2023-03-29 DIAGNOSIS — R2681 Unsteadiness on feet: Secondary | ICD-10-CM | POA: Diagnosis not present

## 2023-03-30 DIAGNOSIS — M6281 Muscle weakness (generalized): Secondary | ICD-10-CM | POA: Diagnosis not present

## 2023-03-30 DIAGNOSIS — R2689 Other abnormalities of gait and mobility: Secondary | ICD-10-CM | POA: Diagnosis not present

## 2023-03-30 DIAGNOSIS — R41841 Cognitive communication deficit: Secondary | ICD-10-CM | POA: Diagnosis not present

## 2023-03-30 DIAGNOSIS — R2681 Unsteadiness on feet: Secondary | ICD-10-CM | POA: Diagnosis not present

## 2023-03-30 DIAGNOSIS — R1312 Dysphagia, oropharyngeal phase: Secondary | ICD-10-CM | POA: Diagnosis not present

## 2023-03-31 DIAGNOSIS — R2689 Other abnormalities of gait and mobility: Secondary | ICD-10-CM | POA: Diagnosis not present

## 2023-03-31 DIAGNOSIS — R41841 Cognitive communication deficit: Secondary | ICD-10-CM | POA: Diagnosis not present

## 2023-03-31 DIAGNOSIS — R2681 Unsteadiness on feet: Secondary | ICD-10-CM | POA: Diagnosis not present

## 2023-03-31 DIAGNOSIS — R1312 Dysphagia, oropharyngeal phase: Secondary | ICD-10-CM | POA: Diagnosis not present

## 2023-03-31 DIAGNOSIS — M6281 Muscle weakness (generalized): Secondary | ICD-10-CM | POA: Diagnosis not present

## 2023-04-01 DIAGNOSIS — M6281 Muscle weakness (generalized): Secondary | ICD-10-CM | POA: Diagnosis not present

## 2023-04-01 DIAGNOSIS — R2681 Unsteadiness on feet: Secondary | ICD-10-CM | POA: Diagnosis not present

## 2023-04-01 DIAGNOSIS — R41841 Cognitive communication deficit: Secondary | ICD-10-CM | POA: Diagnosis not present

## 2023-04-01 DIAGNOSIS — R1312 Dysphagia, oropharyngeal phase: Secondary | ICD-10-CM | POA: Diagnosis not present

## 2023-04-01 DIAGNOSIS — R2689 Other abnormalities of gait and mobility: Secondary | ICD-10-CM | POA: Diagnosis not present

## 2023-04-11 DIAGNOSIS — R41841 Cognitive communication deficit: Secondary | ICD-10-CM | POA: Diagnosis not present

## 2023-04-11 DIAGNOSIS — M6281 Muscle weakness (generalized): Secondary | ICD-10-CM | POA: Diagnosis not present

## 2023-04-11 DIAGNOSIS — R2681 Unsteadiness on feet: Secondary | ICD-10-CM | POA: Diagnosis not present

## 2023-04-11 DIAGNOSIS — R1312 Dysphagia, oropharyngeal phase: Secondary | ICD-10-CM | POA: Diagnosis not present

## 2023-04-11 DIAGNOSIS — R2689 Other abnormalities of gait and mobility: Secondary | ICD-10-CM | POA: Diagnosis not present

## 2023-04-13 DIAGNOSIS — R2681 Unsteadiness on feet: Secondary | ICD-10-CM | POA: Diagnosis not present

## 2023-04-13 DIAGNOSIS — R41841 Cognitive communication deficit: Secondary | ICD-10-CM | POA: Diagnosis not present

## 2023-04-13 DIAGNOSIS — R1312 Dysphagia, oropharyngeal phase: Secondary | ICD-10-CM | POA: Diagnosis not present

## 2023-04-13 DIAGNOSIS — M6281 Muscle weakness (generalized): Secondary | ICD-10-CM | POA: Diagnosis not present

## 2023-04-13 DIAGNOSIS — R2689 Other abnormalities of gait and mobility: Secondary | ICD-10-CM | POA: Diagnosis not present

## 2023-04-14 DIAGNOSIS — M6281 Muscle weakness (generalized): Secondary | ICD-10-CM | POA: Diagnosis not present

## 2023-04-14 DIAGNOSIS — R2681 Unsteadiness on feet: Secondary | ICD-10-CM | POA: Diagnosis not present

## 2023-04-14 DIAGNOSIS — R41841 Cognitive communication deficit: Secondary | ICD-10-CM | POA: Diagnosis not present

## 2023-04-14 DIAGNOSIS — R1312 Dysphagia, oropharyngeal phase: Secondary | ICD-10-CM | POA: Diagnosis not present

## 2023-04-14 DIAGNOSIS — R2689 Other abnormalities of gait and mobility: Secondary | ICD-10-CM | POA: Diagnosis not present

## 2023-04-18 DIAGNOSIS — R41841 Cognitive communication deficit: Secondary | ICD-10-CM | POA: Diagnosis not present

## 2023-04-18 DIAGNOSIS — R1312 Dysphagia, oropharyngeal phase: Secondary | ICD-10-CM | POA: Diagnosis not present

## 2023-04-18 DIAGNOSIS — R2681 Unsteadiness on feet: Secondary | ICD-10-CM | POA: Diagnosis not present

## 2023-04-18 DIAGNOSIS — M6281 Muscle weakness (generalized): Secondary | ICD-10-CM | POA: Diagnosis not present

## 2023-04-18 DIAGNOSIS — R2689 Other abnormalities of gait and mobility: Secondary | ICD-10-CM | POA: Diagnosis not present

## 2023-04-19 DIAGNOSIS — R2689 Other abnormalities of gait and mobility: Secondary | ICD-10-CM | POA: Diagnosis not present

## 2023-04-19 DIAGNOSIS — R2681 Unsteadiness on feet: Secondary | ICD-10-CM | POA: Diagnosis not present

## 2023-04-19 DIAGNOSIS — M6281 Muscle weakness (generalized): Secondary | ICD-10-CM | POA: Diagnosis not present

## 2023-04-19 DIAGNOSIS — R41841 Cognitive communication deficit: Secondary | ICD-10-CM | POA: Diagnosis not present

## 2023-04-19 DIAGNOSIS — R1312 Dysphagia, oropharyngeal phase: Secondary | ICD-10-CM | POA: Diagnosis not present

## 2023-04-20 DIAGNOSIS — R41841 Cognitive communication deficit: Secondary | ICD-10-CM | POA: Diagnosis not present

## 2023-04-20 DIAGNOSIS — R2689 Other abnormalities of gait and mobility: Secondary | ICD-10-CM | POA: Diagnosis not present

## 2023-04-20 DIAGNOSIS — R2681 Unsteadiness on feet: Secondary | ICD-10-CM | POA: Diagnosis not present

## 2023-04-20 DIAGNOSIS — R1312 Dysphagia, oropharyngeal phase: Secondary | ICD-10-CM | POA: Diagnosis not present

## 2023-04-20 DIAGNOSIS — M6281 Muscle weakness (generalized): Secondary | ICD-10-CM | POA: Diagnosis not present

## 2023-04-21 DIAGNOSIS — R1312 Dysphagia, oropharyngeal phase: Secondary | ICD-10-CM | POA: Diagnosis not present

## 2023-04-21 DIAGNOSIS — R2689 Other abnormalities of gait and mobility: Secondary | ICD-10-CM | POA: Diagnosis not present

## 2023-04-21 DIAGNOSIS — R2681 Unsteadiness on feet: Secondary | ICD-10-CM | POA: Diagnosis not present

## 2023-04-21 DIAGNOSIS — M6281 Muscle weakness (generalized): Secondary | ICD-10-CM | POA: Diagnosis not present

## 2023-04-21 DIAGNOSIS — R41841 Cognitive communication deficit: Secondary | ICD-10-CM | POA: Diagnosis not present

## 2023-04-22 DIAGNOSIS — R2689 Other abnormalities of gait and mobility: Secondary | ICD-10-CM | POA: Diagnosis not present

## 2023-04-22 DIAGNOSIS — R1312 Dysphagia, oropharyngeal phase: Secondary | ICD-10-CM | POA: Diagnosis not present

## 2023-04-22 DIAGNOSIS — M6281 Muscle weakness (generalized): Secondary | ICD-10-CM | POA: Diagnosis not present

## 2023-04-22 DIAGNOSIS — R41841 Cognitive communication deficit: Secondary | ICD-10-CM | POA: Diagnosis not present

## 2023-04-22 DIAGNOSIS — R2681 Unsteadiness on feet: Secondary | ICD-10-CM | POA: Diagnosis not present

## 2023-04-25 DIAGNOSIS — M6250 Muscle wasting and atrophy, not elsewhere classified, unspecified site: Secondary | ICD-10-CM | POA: Diagnosis not present

## 2023-04-25 DIAGNOSIS — R2681 Unsteadiness on feet: Secondary | ICD-10-CM | POA: Diagnosis not present

## 2023-04-25 DIAGNOSIS — R1312 Dysphagia, oropharyngeal phase: Secondary | ICD-10-CM | POA: Diagnosis not present

## 2023-04-25 DIAGNOSIS — R41841 Cognitive communication deficit: Secondary | ICD-10-CM | POA: Diagnosis not present

## 2023-04-26 DIAGNOSIS — R1312 Dysphagia, oropharyngeal phase: Secondary | ICD-10-CM | POA: Diagnosis not present

## 2023-04-26 DIAGNOSIS — R41841 Cognitive communication deficit: Secondary | ICD-10-CM | POA: Diagnosis not present

## 2023-04-26 DIAGNOSIS — R2681 Unsteadiness on feet: Secondary | ICD-10-CM | POA: Diagnosis not present

## 2023-04-26 DIAGNOSIS — M6250 Muscle wasting and atrophy, not elsewhere classified, unspecified site: Secondary | ICD-10-CM | POA: Diagnosis not present

## 2023-04-27 DIAGNOSIS — R2681 Unsteadiness on feet: Secondary | ICD-10-CM | POA: Diagnosis not present

## 2023-04-27 DIAGNOSIS — R41841 Cognitive communication deficit: Secondary | ICD-10-CM | POA: Diagnosis not present

## 2023-04-27 DIAGNOSIS — M6250 Muscle wasting and atrophy, not elsewhere classified, unspecified site: Secondary | ICD-10-CM | POA: Diagnosis not present

## 2023-04-27 DIAGNOSIS — R1312 Dysphagia, oropharyngeal phase: Secondary | ICD-10-CM | POA: Diagnosis not present

## 2023-04-28 DIAGNOSIS — R1312 Dysphagia, oropharyngeal phase: Secondary | ICD-10-CM | POA: Diagnosis not present

## 2023-04-28 DIAGNOSIS — R41841 Cognitive communication deficit: Secondary | ICD-10-CM | POA: Diagnosis not present

## 2023-04-28 DIAGNOSIS — R2681 Unsteadiness on feet: Secondary | ICD-10-CM | POA: Diagnosis not present

## 2023-04-28 DIAGNOSIS — M6250 Muscle wasting and atrophy, not elsewhere classified, unspecified site: Secondary | ICD-10-CM | POA: Diagnosis not present

## 2023-04-29 DIAGNOSIS — R2681 Unsteadiness on feet: Secondary | ICD-10-CM | POA: Diagnosis not present

## 2023-04-29 DIAGNOSIS — R1312 Dysphagia, oropharyngeal phase: Secondary | ICD-10-CM | POA: Diagnosis not present

## 2023-04-29 DIAGNOSIS — M6250 Muscle wasting and atrophy, not elsewhere classified, unspecified site: Secondary | ICD-10-CM | POA: Diagnosis not present

## 2023-04-29 DIAGNOSIS — R41841 Cognitive communication deficit: Secondary | ICD-10-CM | POA: Diagnosis not present

## 2023-05-02 DIAGNOSIS — M6250 Muscle wasting and atrophy, not elsewhere classified, unspecified site: Secondary | ICD-10-CM | POA: Diagnosis not present

## 2023-05-02 DIAGNOSIS — R1312 Dysphagia, oropharyngeal phase: Secondary | ICD-10-CM | POA: Diagnosis not present

## 2023-05-02 DIAGNOSIS — R41841 Cognitive communication deficit: Secondary | ICD-10-CM | POA: Diagnosis not present

## 2023-05-02 DIAGNOSIS — R2681 Unsteadiness on feet: Secondary | ICD-10-CM | POA: Diagnosis not present

## 2023-05-03 DIAGNOSIS — R41841 Cognitive communication deficit: Secondary | ICD-10-CM | POA: Diagnosis not present

## 2023-05-03 DIAGNOSIS — M6250 Muscle wasting and atrophy, not elsewhere classified, unspecified site: Secondary | ICD-10-CM | POA: Diagnosis not present

## 2023-05-03 DIAGNOSIS — R2681 Unsteadiness on feet: Secondary | ICD-10-CM | POA: Diagnosis not present

## 2023-05-03 DIAGNOSIS — R1312 Dysphagia, oropharyngeal phase: Secondary | ICD-10-CM | POA: Diagnosis not present

## 2023-05-04 DIAGNOSIS — R1312 Dysphagia, oropharyngeal phase: Secondary | ICD-10-CM | POA: Diagnosis not present

## 2023-05-04 DIAGNOSIS — R2681 Unsteadiness on feet: Secondary | ICD-10-CM | POA: Diagnosis not present

## 2023-05-04 DIAGNOSIS — R41841 Cognitive communication deficit: Secondary | ICD-10-CM | POA: Diagnosis not present

## 2023-05-04 DIAGNOSIS — M6250 Muscle wasting and atrophy, not elsewhere classified, unspecified site: Secondary | ICD-10-CM | POA: Diagnosis not present

## 2023-05-05 DIAGNOSIS — R2681 Unsteadiness on feet: Secondary | ICD-10-CM | POA: Diagnosis not present

## 2023-05-05 DIAGNOSIS — M6250 Muscle wasting and atrophy, not elsewhere classified, unspecified site: Secondary | ICD-10-CM | POA: Diagnosis not present

## 2023-05-05 DIAGNOSIS — R41841 Cognitive communication deficit: Secondary | ICD-10-CM | POA: Diagnosis not present

## 2023-05-05 DIAGNOSIS — R1312 Dysphagia, oropharyngeal phase: Secondary | ICD-10-CM | POA: Diagnosis not present

## 2023-05-06 DIAGNOSIS — R2681 Unsteadiness on feet: Secondary | ICD-10-CM | POA: Diagnosis not present

## 2023-05-06 DIAGNOSIS — M6250 Muscle wasting and atrophy, not elsewhere classified, unspecified site: Secondary | ICD-10-CM | POA: Diagnosis not present

## 2023-05-06 DIAGNOSIS — R41841 Cognitive communication deficit: Secondary | ICD-10-CM | POA: Diagnosis not present

## 2023-05-06 DIAGNOSIS — R1312 Dysphagia, oropharyngeal phase: Secondary | ICD-10-CM | POA: Diagnosis not present

## 2023-05-09 DIAGNOSIS — R2681 Unsteadiness on feet: Secondary | ICD-10-CM | POA: Diagnosis not present

## 2023-05-09 DIAGNOSIS — M6250 Muscle wasting and atrophy, not elsewhere classified, unspecified site: Secondary | ICD-10-CM | POA: Diagnosis not present

## 2023-05-09 DIAGNOSIS — R1312 Dysphagia, oropharyngeal phase: Secondary | ICD-10-CM | POA: Diagnosis not present

## 2023-05-09 DIAGNOSIS — R41841 Cognitive communication deficit: Secondary | ICD-10-CM | POA: Diagnosis not present

## 2023-05-10 DIAGNOSIS — R41841 Cognitive communication deficit: Secondary | ICD-10-CM | POA: Diagnosis not present

## 2023-05-10 DIAGNOSIS — R2681 Unsteadiness on feet: Secondary | ICD-10-CM | POA: Diagnosis not present

## 2023-05-10 DIAGNOSIS — R1312 Dysphagia, oropharyngeal phase: Secondary | ICD-10-CM | POA: Diagnosis not present

## 2023-05-10 DIAGNOSIS — M6250 Muscle wasting and atrophy, not elsewhere classified, unspecified site: Secondary | ICD-10-CM | POA: Diagnosis not present

## 2023-05-11 DIAGNOSIS — R41841 Cognitive communication deficit: Secondary | ICD-10-CM | POA: Diagnosis not present

## 2023-05-11 DIAGNOSIS — R1312 Dysphagia, oropharyngeal phase: Secondary | ICD-10-CM | POA: Diagnosis not present

## 2023-05-11 DIAGNOSIS — M6250 Muscle wasting and atrophy, not elsewhere classified, unspecified site: Secondary | ICD-10-CM | POA: Diagnosis not present

## 2023-05-11 DIAGNOSIS — R2681 Unsteadiness on feet: Secondary | ICD-10-CM | POA: Diagnosis not present

## 2023-05-12 DIAGNOSIS — R1312 Dysphagia, oropharyngeal phase: Secondary | ICD-10-CM | POA: Diagnosis not present

## 2023-05-12 DIAGNOSIS — M6250 Muscle wasting and atrophy, not elsewhere classified, unspecified site: Secondary | ICD-10-CM | POA: Diagnosis not present

## 2023-05-12 DIAGNOSIS — R2681 Unsteadiness on feet: Secondary | ICD-10-CM | POA: Diagnosis not present

## 2023-05-12 DIAGNOSIS — R41841 Cognitive communication deficit: Secondary | ICD-10-CM | POA: Diagnosis not present

## 2023-05-13 DIAGNOSIS — R41841 Cognitive communication deficit: Secondary | ICD-10-CM | POA: Diagnosis not present

## 2023-05-13 DIAGNOSIS — R2681 Unsteadiness on feet: Secondary | ICD-10-CM | POA: Diagnosis not present

## 2023-05-13 DIAGNOSIS — M6250 Muscle wasting and atrophy, not elsewhere classified, unspecified site: Secondary | ICD-10-CM | POA: Diagnosis not present

## 2023-05-13 DIAGNOSIS — R1312 Dysphagia, oropharyngeal phase: Secondary | ICD-10-CM | POA: Diagnosis not present

## 2023-05-16 DIAGNOSIS — R1312 Dysphagia, oropharyngeal phase: Secondary | ICD-10-CM | POA: Diagnosis not present

## 2023-05-16 DIAGNOSIS — R2681 Unsteadiness on feet: Secondary | ICD-10-CM | POA: Diagnosis not present

## 2023-05-16 DIAGNOSIS — R41841 Cognitive communication deficit: Secondary | ICD-10-CM | POA: Diagnosis not present

## 2023-05-16 DIAGNOSIS — M6250 Muscle wasting and atrophy, not elsewhere classified, unspecified site: Secondary | ICD-10-CM | POA: Diagnosis not present

## 2023-05-17 DIAGNOSIS — R1312 Dysphagia, oropharyngeal phase: Secondary | ICD-10-CM | POA: Diagnosis not present

## 2023-05-17 DIAGNOSIS — R2681 Unsteadiness on feet: Secondary | ICD-10-CM | POA: Diagnosis not present

## 2023-05-17 DIAGNOSIS — R41841 Cognitive communication deficit: Secondary | ICD-10-CM | POA: Diagnosis not present

## 2023-05-17 DIAGNOSIS — M6250 Muscle wasting and atrophy, not elsewhere classified, unspecified site: Secondary | ICD-10-CM | POA: Diagnosis not present

## 2023-05-18 DIAGNOSIS — R2681 Unsteadiness on feet: Secondary | ICD-10-CM | POA: Diagnosis not present

## 2023-05-18 DIAGNOSIS — R1312 Dysphagia, oropharyngeal phase: Secondary | ICD-10-CM | POA: Diagnosis not present

## 2023-05-18 DIAGNOSIS — R41841 Cognitive communication deficit: Secondary | ICD-10-CM | POA: Diagnosis not present

## 2023-05-18 DIAGNOSIS — M6250 Muscle wasting and atrophy, not elsewhere classified, unspecified site: Secondary | ICD-10-CM | POA: Diagnosis not present

## 2023-05-19 DIAGNOSIS — R41841 Cognitive communication deficit: Secondary | ICD-10-CM | POA: Diagnosis not present

## 2023-05-19 DIAGNOSIS — R1312 Dysphagia, oropharyngeal phase: Secondary | ICD-10-CM | POA: Diagnosis not present

## 2023-05-19 DIAGNOSIS — R2681 Unsteadiness on feet: Secondary | ICD-10-CM | POA: Diagnosis not present

## 2023-05-19 DIAGNOSIS — M6250 Muscle wasting and atrophy, not elsewhere classified, unspecified site: Secondary | ICD-10-CM | POA: Diagnosis not present

## 2023-05-20 DIAGNOSIS — R2681 Unsteadiness on feet: Secondary | ICD-10-CM | POA: Diagnosis not present

## 2023-05-20 DIAGNOSIS — M6250 Muscle wasting and atrophy, not elsewhere classified, unspecified site: Secondary | ICD-10-CM | POA: Diagnosis not present

## 2023-05-20 DIAGNOSIS — R1312 Dysphagia, oropharyngeal phase: Secondary | ICD-10-CM | POA: Diagnosis not present

## 2023-05-20 DIAGNOSIS — R41841 Cognitive communication deficit: Secondary | ICD-10-CM | POA: Diagnosis not present

## 2023-05-23 DIAGNOSIS — R2681 Unsteadiness on feet: Secondary | ICD-10-CM | POA: Diagnosis not present

## 2023-05-23 DIAGNOSIS — M6250 Muscle wasting and atrophy, not elsewhere classified, unspecified site: Secondary | ICD-10-CM | POA: Diagnosis not present

## 2023-05-23 DIAGNOSIS — R41841 Cognitive communication deficit: Secondary | ICD-10-CM | POA: Diagnosis not present

## 2023-05-23 DIAGNOSIS — R1312 Dysphagia, oropharyngeal phase: Secondary | ICD-10-CM | POA: Diagnosis not present

## 2023-05-24 DIAGNOSIS — G459 Transient cerebral ischemic attack, unspecified: Secondary | ICD-10-CM | POA: Diagnosis not present

## 2023-05-24 DIAGNOSIS — M4807 Spinal stenosis, lumbosacral region: Secondary | ICD-10-CM | POA: Diagnosis not present

## 2023-05-24 DIAGNOSIS — M6250 Muscle wasting and atrophy, not elsewhere classified, unspecified site: Secondary | ICD-10-CM | POA: Diagnosis not present

## 2023-05-24 DIAGNOSIS — I5043 Acute on chronic combined systolic (congestive) and diastolic (congestive) heart failure: Secondary | ICD-10-CM | POA: Diagnosis not present

## 2023-05-24 DIAGNOSIS — R2681 Unsteadiness on feet: Secondary | ICD-10-CM | POA: Diagnosis not present

## 2023-05-24 DIAGNOSIS — R1312 Dysphagia, oropharyngeal phase: Secondary | ICD-10-CM | POA: Diagnosis not present

## 2023-05-24 DIAGNOSIS — I69198 Other sequelae of nontraumatic intracerebral hemorrhage: Secondary | ICD-10-CM | POA: Diagnosis not present

## 2023-05-24 DIAGNOSIS — R41841 Cognitive communication deficit: Secondary | ICD-10-CM | POA: Diagnosis not present

## 2023-05-25 DIAGNOSIS — I5043 Acute on chronic combined systolic (congestive) and diastolic (congestive) heart failure: Secondary | ICD-10-CM | POA: Diagnosis not present

## 2023-05-25 DIAGNOSIS — R41841 Cognitive communication deficit: Secondary | ICD-10-CM | POA: Diagnosis not present

## 2023-05-25 DIAGNOSIS — M6250 Muscle wasting and atrophy, not elsewhere classified, unspecified site: Secondary | ICD-10-CM | POA: Diagnosis not present

## 2023-05-25 DIAGNOSIS — R1312 Dysphagia, oropharyngeal phase: Secondary | ICD-10-CM | POA: Diagnosis not present

## 2023-05-25 DIAGNOSIS — G459 Transient cerebral ischemic attack, unspecified: Secondary | ICD-10-CM | POA: Diagnosis not present

## 2023-05-25 DIAGNOSIS — M4807 Spinal stenosis, lumbosacral region: Secondary | ICD-10-CM | POA: Diagnosis not present

## 2023-05-25 DIAGNOSIS — R2681 Unsteadiness on feet: Secondary | ICD-10-CM | POA: Diagnosis not present

## 2023-05-25 DIAGNOSIS — I69198 Other sequelae of nontraumatic intracerebral hemorrhage: Secondary | ICD-10-CM | POA: Diagnosis not present

## 2023-05-26 DIAGNOSIS — R41841 Cognitive communication deficit: Secondary | ICD-10-CM | POA: Diagnosis not present

## 2023-05-26 DIAGNOSIS — R2681 Unsteadiness on feet: Secondary | ICD-10-CM | POA: Diagnosis not present

## 2023-05-26 DIAGNOSIS — R1312 Dysphagia, oropharyngeal phase: Secondary | ICD-10-CM | POA: Diagnosis not present

## 2023-05-26 DIAGNOSIS — I69198 Other sequelae of nontraumatic intracerebral hemorrhage: Secondary | ICD-10-CM | POA: Diagnosis not present

## 2023-05-26 DIAGNOSIS — G459 Transient cerebral ischemic attack, unspecified: Secondary | ICD-10-CM | POA: Diagnosis not present

## 2023-05-26 DIAGNOSIS — M6250 Muscle wasting and atrophy, not elsewhere classified, unspecified site: Secondary | ICD-10-CM | POA: Diagnosis not present

## 2023-05-26 DIAGNOSIS — I5043 Acute on chronic combined systolic (congestive) and diastolic (congestive) heart failure: Secondary | ICD-10-CM | POA: Diagnosis not present

## 2023-05-26 DIAGNOSIS — M4807 Spinal stenosis, lumbosacral region: Secondary | ICD-10-CM | POA: Diagnosis not present

## 2023-05-27 DIAGNOSIS — G459 Transient cerebral ischemic attack, unspecified: Secondary | ICD-10-CM | POA: Diagnosis not present

## 2023-05-27 DIAGNOSIS — M6250 Muscle wasting and atrophy, not elsewhere classified, unspecified site: Secondary | ICD-10-CM | POA: Diagnosis not present

## 2023-05-27 DIAGNOSIS — R2681 Unsteadiness on feet: Secondary | ICD-10-CM | POA: Diagnosis not present

## 2023-05-27 DIAGNOSIS — M4807 Spinal stenosis, lumbosacral region: Secondary | ICD-10-CM | POA: Diagnosis not present

## 2023-05-27 DIAGNOSIS — I5043 Acute on chronic combined systolic (congestive) and diastolic (congestive) heart failure: Secondary | ICD-10-CM | POA: Diagnosis not present

## 2023-05-27 DIAGNOSIS — I69198 Other sequelae of nontraumatic intracerebral hemorrhage: Secondary | ICD-10-CM | POA: Diagnosis not present

## 2023-05-27 DIAGNOSIS — R1312 Dysphagia, oropharyngeal phase: Secondary | ICD-10-CM | POA: Diagnosis not present

## 2023-05-27 DIAGNOSIS — R41841 Cognitive communication deficit: Secondary | ICD-10-CM | POA: Diagnosis not present

## 2023-05-30 DIAGNOSIS — R2681 Unsteadiness on feet: Secondary | ICD-10-CM | POA: Diagnosis not present

## 2023-05-30 DIAGNOSIS — G459 Transient cerebral ischemic attack, unspecified: Secondary | ICD-10-CM | POA: Diagnosis not present

## 2023-05-30 DIAGNOSIS — R41841 Cognitive communication deficit: Secondary | ICD-10-CM | POA: Diagnosis not present

## 2023-05-30 DIAGNOSIS — M4807 Spinal stenosis, lumbosacral region: Secondary | ICD-10-CM | POA: Diagnosis not present

## 2023-05-30 DIAGNOSIS — I69198 Other sequelae of nontraumatic intracerebral hemorrhage: Secondary | ICD-10-CM | POA: Diagnosis not present

## 2023-05-30 DIAGNOSIS — M6250 Muscle wasting and atrophy, not elsewhere classified, unspecified site: Secondary | ICD-10-CM | POA: Diagnosis not present

## 2023-05-30 DIAGNOSIS — I5043 Acute on chronic combined systolic (congestive) and diastolic (congestive) heart failure: Secondary | ICD-10-CM | POA: Diagnosis not present

## 2023-05-30 DIAGNOSIS — R1312 Dysphagia, oropharyngeal phase: Secondary | ICD-10-CM | POA: Diagnosis not present

## 2023-05-31 DIAGNOSIS — I69198 Other sequelae of nontraumatic intracerebral hemorrhage: Secondary | ICD-10-CM | POA: Diagnosis not present

## 2023-05-31 DIAGNOSIS — R1312 Dysphagia, oropharyngeal phase: Secondary | ICD-10-CM | POA: Diagnosis not present

## 2023-05-31 DIAGNOSIS — R2681 Unsteadiness on feet: Secondary | ICD-10-CM | POA: Diagnosis not present

## 2023-05-31 DIAGNOSIS — M6250 Muscle wasting and atrophy, not elsewhere classified, unspecified site: Secondary | ICD-10-CM | POA: Diagnosis not present

## 2023-05-31 DIAGNOSIS — R41841 Cognitive communication deficit: Secondary | ICD-10-CM | POA: Diagnosis not present

## 2023-05-31 DIAGNOSIS — G459 Transient cerebral ischemic attack, unspecified: Secondary | ICD-10-CM | POA: Diagnosis not present

## 2023-05-31 DIAGNOSIS — M4807 Spinal stenosis, lumbosacral region: Secondary | ICD-10-CM | POA: Diagnosis not present

## 2023-05-31 DIAGNOSIS — I5043 Acute on chronic combined systolic (congestive) and diastolic (congestive) heart failure: Secondary | ICD-10-CM | POA: Diagnosis not present

## 2023-06-01 DIAGNOSIS — M6250 Muscle wasting and atrophy, not elsewhere classified, unspecified site: Secondary | ICD-10-CM | POA: Diagnosis not present

## 2023-06-01 DIAGNOSIS — I69198 Other sequelae of nontraumatic intracerebral hemorrhage: Secondary | ICD-10-CM | POA: Diagnosis not present

## 2023-06-01 DIAGNOSIS — M4807 Spinal stenosis, lumbosacral region: Secondary | ICD-10-CM | POA: Diagnosis not present

## 2023-06-01 DIAGNOSIS — R41841 Cognitive communication deficit: Secondary | ICD-10-CM | POA: Diagnosis not present

## 2023-06-01 DIAGNOSIS — I5043 Acute on chronic combined systolic (congestive) and diastolic (congestive) heart failure: Secondary | ICD-10-CM | POA: Diagnosis not present

## 2023-06-01 DIAGNOSIS — R1312 Dysphagia, oropharyngeal phase: Secondary | ICD-10-CM | POA: Diagnosis not present

## 2023-06-01 DIAGNOSIS — R2681 Unsteadiness on feet: Secondary | ICD-10-CM | POA: Diagnosis not present

## 2023-06-01 DIAGNOSIS — G459 Transient cerebral ischemic attack, unspecified: Secondary | ICD-10-CM | POA: Diagnosis not present

## 2023-06-02 DIAGNOSIS — G459 Transient cerebral ischemic attack, unspecified: Secondary | ICD-10-CM | POA: Diagnosis not present

## 2023-06-02 DIAGNOSIS — R2681 Unsteadiness on feet: Secondary | ICD-10-CM | POA: Diagnosis not present

## 2023-06-02 DIAGNOSIS — R41841 Cognitive communication deficit: Secondary | ICD-10-CM | POA: Diagnosis not present

## 2023-06-02 DIAGNOSIS — I69198 Other sequelae of nontraumatic intracerebral hemorrhage: Secondary | ICD-10-CM | POA: Diagnosis not present

## 2023-06-02 DIAGNOSIS — M6250 Muscle wasting and atrophy, not elsewhere classified, unspecified site: Secondary | ICD-10-CM | POA: Diagnosis not present

## 2023-06-02 DIAGNOSIS — I5043 Acute on chronic combined systolic (congestive) and diastolic (congestive) heart failure: Secondary | ICD-10-CM | POA: Diagnosis not present

## 2023-06-02 DIAGNOSIS — M4807 Spinal stenosis, lumbosacral region: Secondary | ICD-10-CM | POA: Diagnosis not present

## 2023-06-02 DIAGNOSIS — R1312 Dysphagia, oropharyngeal phase: Secondary | ICD-10-CM | POA: Diagnosis not present

## 2023-06-03 DIAGNOSIS — R2681 Unsteadiness on feet: Secondary | ICD-10-CM | POA: Diagnosis not present

## 2023-06-03 DIAGNOSIS — I5043 Acute on chronic combined systolic (congestive) and diastolic (congestive) heart failure: Secondary | ICD-10-CM | POA: Diagnosis not present

## 2023-06-03 DIAGNOSIS — G459 Transient cerebral ischemic attack, unspecified: Secondary | ICD-10-CM | POA: Diagnosis not present

## 2023-06-03 DIAGNOSIS — M6250 Muscle wasting and atrophy, not elsewhere classified, unspecified site: Secondary | ICD-10-CM | POA: Diagnosis not present

## 2023-06-03 DIAGNOSIS — R41841 Cognitive communication deficit: Secondary | ICD-10-CM | POA: Diagnosis not present

## 2023-06-03 DIAGNOSIS — M4807 Spinal stenosis, lumbosacral region: Secondary | ICD-10-CM | POA: Diagnosis not present

## 2023-06-03 DIAGNOSIS — I69198 Other sequelae of nontraumatic intracerebral hemorrhage: Secondary | ICD-10-CM | POA: Diagnosis not present

## 2023-06-03 DIAGNOSIS — R1312 Dysphagia, oropharyngeal phase: Secondary | ICD-10-CM | POA: Diagnosis not present

## 2023-06-06 DIAGNOSIS — G459 Transient cerebral ischemic attack, unspecified: Secondary | ICD-10-CM | POA: Diagnosis not present

## 2023-06-06 DIAGNOSIS — M4807 Spinal stenosis, lumbosacral region: Secondary | ICD-10-CM | POA: Diagnosis not present

## 2023-06-06 DIAGNOSIS — I69198 Other sequelae of nontraumatic intracerebral hemorrhage: Secondary | ICD-10-CM | POA: Diagnosis not present

## 2023-06-06 DIAGNOSIS — R2681 Unsteadiness on feet: Secondary | ICD-10-CM | POA: Diagnosis not present

## 2023-06-06 DIAGNOSIS — I5043 Acute on chronic combined systolic (congestive) and diastolic (congestive) heart failure: Secondary | ICD-10-CM | POA: Diagnosis not present

## 2023-06-06 DIAGNOSIS — R1312 Dysphagia, oropharyngeal phase: Secondary | ICD-10-CM | POA: Diagnosis not present

## 2023-06-06 DIAGNOSIS — R41841 Cognitive communication deficit: Secondary | ICD-10-CM | POA: Diagnosis not present

## 2023-06-06 DIAGNOSIS — M6250 Muscle wasting and atrophy, not elsewhere classified, unspecified site: Secondary | ICD-10-CM | POA: Diagnosis not present

## 2023-06-07 DIAGNOSIS — R2681 Unsteadiness on feet: Secondary | ICD-10-CM | POA: Diagnosis not present

## 2023-06-07 DIAGNOSIS — G459 Transient cerebral ischemic attack, unspecified: Secondary | ICD-10-CM | POA: Diagnosis not present

## 2023-06-07 DIAGNOSIS — I69198 Other sequelae of nontraumatic intracerebral hemorrhage: Secondary | ICD-10-CM | POA: Diagnosis not present

## 2023-06-07 DIAGNOSIS — M4807 Spinal stenosis, lumbosacral region: Secondary | ICD-10-CM | POA: Diagnosis not present

## 2023-06-07 DIAGNOSIS — R41841 Cognitive communication deficit: Secondary | ICD-10-CM | POA: Diagnosis not present

## 2023-06-07 DIAGNOSIS — R1312 Dysphagia, oropharyngeal phase: Secondary | ICD-10-CM | POA: Diagnosis not present

## 2023-06-07 DIAGNOSIS — I5043 Acute on chronic combined systolic (congestive) and diastolic (congestive) heart failure: Secondary | ICD-10-CM | POA: Diagnosis not present

## 2023-06-07 DIAGNOSIS — M6250 Muscle wasting and atrophy, not elsewhere classified, unspecified site: Secondary | ICD-10-CM | POA: Diagnosis not present

## 2023-06-08 DIAGNOSIS — R41841 Cognitive communication deficit: Secondary | ICD-10-CM | POA: Diagnosis not present

## 2023-06-08 DIAGNOSIS — M4807 Spinal stenosis, lumbosacral region: Secondary | ICD-10-CM | POA: Diagnosis not present

## 2023-06-08 DIAGNOSIS — I5043 Acute on chronic combined systolic (congestive) and diastolic (congestive) heart failure: Secondary | ICD-10-CM | POA: Diagnosis not present

## 2023-06-08 DIAGNOSIS — I69198 Other sequelae of nontraumatic intracerebral hemorrhage: Secondary | ICD-10-CM | POA: Diagnosis not present

## 2023-06-08 DIAGNOSIS — G459 Transient cerebral ischemic attack, unspecified: Secondary | ICD-10-CM | POA: Diagnosis not present

## 2023-06-08 DIAGNOSIS — R2681 Unsteadiness on feet: Secondary | ICD-10-CM | POA: Diagnosis not present

## 2023-06-08 DIAGNOSIS — R1312 Dysphagia, oropharyngeal phase: Secondary | ICD-10-CM | POA: Diagnosis not present

## 2023-06-08 DIAGNOSIS — M6250 Muscle wasting and atrophy, not elsewhere classified, unspecified site: Secondary | ICD-10-CM | POA: Diagnosis not present

## 2023-06-09 DIAGNOSIS — G459 Transient cerebral ischemic attack, unspecified: Secondary | ICD-10-CM | POA: Diagnosis not present

## 2023-06-09 DIAGNOSIS — R41841 Cognitive communication deficit: Secondary | ICD-10-CM | POA: Diagnosis not present

## 2023-06-09 DIAGNOSIS — M4807 Spinal stenosis, lumbosacral region: Secondary | ICD-10-CM | POA: Diagnosis not present

## 2023-06-09 DIAGNOSIS — I69198 Other sequelae of nontraumatic intracerebral hemorrhage: Secondary | ICD-10-CM | POA: Diagnosis not present

## 2023-06-09 DIAGNOSIS — I5043 Acute on chronic combined systolic (congestive) and diastolic (congestive) heart failure: Secondary | ICD-10-CM | POA: Diagnosis not present

## 2023-06-09 DIAGNOSIS — R2681 Unsteadiness on feet: Secondary | ICD-10-CM | POA: Diagnosis not present

## 2023-06-09 DIAGNOSIS — M6250 Muscle wasting and atrophy, not elsewhere classified, unspecified site: Secondary | ICD-10-CM | POA: Diagnosis not present

## 2023-06-09 DIAGNOSIS — R1312 Dysphagia, oropharyngeal phase: Secondary | ICD-10-CM | POA: Diagnosis not present

## 2023-06-13 DIAGNOSIS — R2681 Unsteadiness on feet: Secondary | ICD-10-CM | POA: Diagnosis not present

## 2023-06-13 DIAGNOSIS — I69198 Other sequelae of nontraumatic intracerebral hemorrhage: Secondary | ICD-10-CM | POA: Diagnosis not present

## 2023-06-13 DIAGNOSIS — I5043 Acute on chronic combined systolic (congestive) and diastolic (congestive) heart failure: Secondary | ICD-10-CM | POA: Diagnosis not present

## 2023-06-13 DIAGNOSIS — M4807 Spinal stenosis, lumbosacral region: Secondary | ICD-10-CM | POA: Diagnosis not present

## 2023-06-13 DIAGNOSIS — M6250 Muscle wasting and atrophy, not elsewhere classified, unspecified site: Secondary | ICD-10-CM | POA: Diagnosis not present

## 2023-06-13 DIAGNOSIS — G459 Transient cerebral ischemic attack, unspecified: Secondary | ICD-10-CM | POA: Diagnosis not present

## 2023-06-13 DIAGNOSIS — R1312 Dysphagia, oropharyngeal phase: Secondary | ICD-10-CM | POA: Diagnosis not present

## 2023-06-13 DIAGNOSIS — R41841 Cognitive communication deficit: Secondary | ICD-10-CM | POA: Diagnosis not present

## 2023-06-14 DIAGNOSIS — I5043 Acute on chronic combined systolic (congestive) and diastolic (congestive) heart failure: Secondary | ICD-10-CM | POA: Diagnosis not present

## 2023-06-14 DIAGNOSIS — R41841 Cognitive communication deficit: Secondary | ICD-10-CM | POA: Diagnosis not present

## 2023-06-14 DIAGNOSIS — M4807 Spinal stenosis, lumbosacral region: Secondary | ICD-10-CM | POA: Diagnosis not present

## 2023-06-14 DIAGNOSIS — R2681 Unsteadiness on feet: Secondary | ICD-10-CM | POA: Diagnosis not present

## 2023-06-14 DIAGNOSIS — I69198 Other sequelae of nontraumatic intracerebral hemorrhage: Secondary | ICD-10-CM | POA: Diagnosis not present

## 2023-06-14 DIAGNOSIS — G459 Transient cerebral ischemic attack, unspecified: Secondary | ICD-10-CM | POA: Diagnosis not present

## 2023-06-14 DIAGNOSIS — R1312 Dysphagia, oropharyngeal phase: Secondary | ICD-10-CM | POA: Diagnosis not present

## 2023-06-14 DIAGNOSIS — M6250 Muscle wasting and atrophy, not elsewhere classified, unspecified site: Secondary | ICD-10-CM | POA: Diagnosis not present

## 2023-06-15 DIAGNOSIS — I5043 Acute on chronic combined systolic (congestive) and diastolic (congestive) heart failure: Secondary | ICD-10-CM | POA: Diagnosis not present

## 2023-06-15 DIAGNOSIS — R1312 Dysphagia, oropharyngeal phase: Secondary | ICD-10-CM | POA: Diagnosis not present

## 2023-06-15 DIAGNOSIS — R2681 Unsteadiness on feet: Secondary | ICD-10-CM | POA: Diagnosis not present

## 2023-06-15 DIAGNOSIS — M6250 Muscle wasting and atrophy, not elsewhere classified, unspecified site: Secondary | ICD-10-CM | POA: Diagnosis not present

## 2023-06-15 DIAGNOSIS — M4807 Spinal stenosis, lumbosacral region: Secondary | ICD-10-CM | POA: Diagnosis not present

## 2023-06-15 DIAGNOSIS — G459 Transient cerebral ischemic attack, unspecified: Secondary | ICD-10-CM | POA: Diagnosis not present

## 2023-06-15 DIAGNOSIS — I69198 Other sequelae of nontraumatic intracerebral hemorrhage: Secondary | ICD-10-CM | POA: Diagnosis not present

## 2023-06-15 DIAGNOSIS — R41841 Cognitive communication deficit: Secondary | ICD-10-CM | POA: Diagnosis not present

## 2023-06-16 DIAGNOSIS — M6250 Muscle wasting and atrophy, not elsewhere classified, unspecified site: Secondary | ICD-10-CM | POA: Diagnosis not present

## 2023-06-16 DIAGNOSIS — I69198 Other sequelae of nontraumatic intracerebral hemorrhage: Secondary | ICD-10-CM | POA: Diagnosis not present

## 2023-06-16 DIAGNOSIS — R1312 Dysphagia, oropharyngeal phase: Secondary | ICD-10-CM | POA: Diagnosis not present

## 2023-06-16 DIAGNOSIS — I5043 Acute on chronic combined systolic (congestive) and diastolic (congestive) heart failure: Secondary | ICD-10-CM | POA: Diagnosis not present

## 2023-06-16 DIAGNOSIS — R2681 Unsteadiness on feet: Secondary | ICD-10-CM | POA: Diagnosis not present

## 2023-06-16 DIAGNOSIS — R41841 Cognitive communication deficit: Secondary | ICD-10-CM | POA: Diagnosis not present

## 2023-06-16 DIAGNOSIS — G459 Transient cerebral ischemic attack, unspecified: Secondary | ICD-10-CM | POA: Diagnosis not present

## 2023-06-16 DIAGNOSIS — M4807 Spinal stenosis, lumbosacral region: Secondary | ICD-10-CM | POA: Diagnosis not present

## 2023-06-17 DIAGNOSIS — M6250 Muscle wasting and atrophy, not elsewhere classified, unspecified site: Secondary | ICD-10-CM | POA: Diagnosis not present

## 2023-06-17 DIAGNOSIS — R1312 Dysphagia, oropharyngeal phase: Secondary | ICD-10-CM | POA: Diagnosis not present

## 2023-06-17 DIAGNOSIS — R2681 Unsteadiness on feet: Secondary | ICD-10-CM | POA: Diagnosis not present

## 2023-06-17 DIAGNOSIS — I69198 Other sequelae of nontraumatic intracerebral hemorrhage: Secondary | ICD-10-CM | POA: Diagnosis not present

## 2023-06-17 DIAGNOSIS — G459 Transient cerebral ischemic attack, unspecified: Secondary | ICD-10-CM | POA: Diagnosis not present

## 2023-06-17 DIAGNOSIS — R41841 Cognitive communication deficit: Secondary | ICD-10-CM | POA: Diagnosis not present

## 2023-06-17 DIAGNOSIS — M4807 Spinal stenosis, lumbosacral region: Secondary | ICD-10-CM | POA: Diagnosis not present

## 2023-06-17 DIAGNOSIS — I5043 Acute on chronic combined systolic (congestive) and diastolic (congestive) heart failure: Secondary | ICD-10-CM | POA: Diagnosis not present

## 2023-06-20 DIAGNOSIS — I5043 Acute on chronic combined systolic (congestive) and diastolic (congestive) heart failure: Secondary | ICD-10-CM | POA: Diagnosis not present

## 2023-06-20 DIAGNOSIS — R1312 Dysphagia, oropharyngeal phase: Secondary | ICD-10-CM | POA: Diagnosis not present

## 2023-06-20 DIAGNOSIS — G459 Transient cerebral ischemic attack, unspecified: Secondary | ICD-10-CM | POA: Diagnosis not present

## 2023-06-20 DIAGNOSIS — M6250 Muscle wasting and atrophy, not elsewhere classified, unspecified site: Secondary | ICD-10-CM | POA: Diagnosis not present

## 2023-06-20 DIAGNOSIS — R2681 Unsteadiness on feet: Secondary | ICD-10-CM | POA: Diagnosis not present

## 2023-06-20 DIAGNOSIS — M4807 Spinal stenosis, lumbosacral region: Secondary | ICD-10-CM | POA: Diagnosis not present

## 2023-06-20 DIAGNOSIS — R41841 Cognitive communication deficit: Secondary | ICD-10-CM | POA: Diagnosis not present

## 2023-06-20 DIAGNOSIS — I69198 Other sequelae of nontraumatic intracerebral hemorrhage: Secondary | ICD-10-CM | POA: Diagnosis not present

## 2023-06-21 DIAGNOSIS — I69198 Other sequelae of nontraumatic intracerebral hemorrhage: Secondary | ICD-10-CM | POA: Diagnosis not present

## 2023-06-21 DIAGNOSIS — M6250 Muscle wasting and atrophy, not elsewhere classified, unspecified site: Secondary | ICD-10-CM | POA: Diagnosis not present

## 2023-06-21 DIAGNOSIS — M4807 Spinal stenosis, lumbosacral region: Secondary | ICD-10-CM | POA: Diagnosis not present

## 2023-06-21 DIAGNOSIS — R1312 Dysphagia, oropharyngeal phase: Secondary | ICD-10-CM | POA: Diagnosis not present

## 2023-06-21 DIAGNOSIS — I5043 Acute on chronic combined systolic (congestive) and diastolic (congestive) heart failure: Secondary | ICD-10-CM | POA: Diagnosis not present

## 2023-06-21 DIAGNOSIS — R2681 Unsteadiness on feet: Secondary | ICD-10-CM | POA: Diagnosis not present

## 2023-06-21 DIAGNOSIS — G459 Transient cerebral ischemic attack, unspecified: Secondary | ICD-10-CM | POA: Diagnosis not present

## 2023-06-21 DIAGNOSIS — R41841 Cognitive communication deficit: Secondary | ICD-10-CM | POA: Diagnosis not present

## 2023-06-22 DIAGNOSIS — I5043 Acute on chronic combined systolic (congestive) and diastolic (congestive) heart failure: Secondary | ICD-10-CM | POA: Diagnosis not present

## 2023-06-22 DIAGNOSIS — I69198 Other sequelae of nontraumatic intracerebral hemorrhage: Secondary | ICD-10-CM | POA: Diagnosis not present

## 2023-06-22 DIAGNOSIS — M4807 Spinal stenosis, lumbosacral region: Secondary | ICD-10-CM | POA: Diagnosis not present

## 2023-06-22 DIAGNOSIS — R1312 Dysphagia, oropharyngeal phase: Secondary | ICD-10-CM | POA: Diagnosis not present

## 2023-06-22 DIAGNOSIS — G459 Transient cerebral ischemic attack, unspecified: Secondary | ICD-10-CM | POA: Diagnosis not present

## 2023-06-22 DIAGNOSIS — R41841 Cognitive communication deficit: Secondary | ICD-10-CM | POA: Diagnosis not present

## 2023-06-22 DIAGNOSIS — M6250 Muscle wasting and atrophy, not elsewhere classified, unspecified site: Secondary | ICD-10-CM | POA: Diagnosis not present

## 2023-06-22 DIAGNOSIS — R2681 Unsteadiness on feet: Secondary | ICD-10-CM | POA: Diagnosis not present

## 2023-06-23 DIAGNOSIS — G459 Transient cerebral ischemic attack, unspecified: Secondary | ICD-10-CM | POA: Diagnosis not present

## 2023-06-23 DIAGNOSIS — R293 Abnormal posture: Secondary | ICD-10-CM | POA: Diagnosis not present

## 2023-06-23 DIAGNOSIS — M4807 Spinal stenosis, lumbosacral region: Secondary | ICD-10-CM | POA: Diagnosis not present

## 2023-06-23 DIAGNOSIS — R41841 Cognitive communication deficit: Secondary | ICD-10-CM | POA: Diagnosis not present

## 2023-06-23 DIAGNOSIS — R279 Unspecified lack of coordination: Secondary | ICD-10-CM | POA: Diagnosis not present

## 2023-06-23 DIAGNOSIS — R1312 Dysphagia, oropharyngeal phase: Secondary | ICD-10-CM | POA: Diagnosis not present

## 2023-06-24 DIAGNOSIS — R41841 Cognitive communication deficit: Secondary | ICD-10-CM | POA: Diagnosis not present

## 2023-06-24 DIAGNOSIS — G459 Transient cerebral ischemic attack, unspecified: Secondary | ICD-10-CM | POA: Diagnosis not present

## 2023-06-24 DIAGNOSIS — R293 Abnormal posture: Secondary | ICD-10-CM | POA: Diagnosis not present

## 2023-06-24 DIAGNOSIS — R1312 Dysphagia, oropharyngeal phase: Secondary | ICD-10-CM | POA: Diagnosis not present

## 2023-06-24 DIAGNOSIS — R279 Unspecified lack of coordination: Secondary | ICD-10-CM | POA: Diagnosis not present

## 2023-06-24 DIAGNOSIS — M4807 Spinal stenosis, lumbosacral region: Secondary | ICD-10-CM | POA: Diagnosis not present

## 2023-06-27 DIAGNOSIS — M4807 Spinal stenosis, lumbosacral region: Secondary | ICD-10-CM | POA: Diagnosis not present

## 2023-06-27 DIAGNOSIS — R1312 Dysphagia, oropharyngeal phase: Secondary | ICD-10-CM | POA: Diagnosis not present

## 2023-06-27 DIAGNOSIS — R279 Unspecified lack of coordination: Secondary | ICD-10-CM | POA: Diagnosis not present

## 2023-06-27 DIAGNOSIS — G459 Transient cerebral ischemic attack, unspecified: Secondary | ICD-10-CM | POA: Diagnosis not present

## 2023-06-27 DIAGNOSIS — R41841 Cognitive communication deficit: Secondary | ICD-10-CM | POA: Diagnosis not present

## 2023-06-27 DIAGNOSIS — R293 Abnormal posture: Secondary | ICD-10-CM | POA: Diagnosis not present

## 2023-06-28 DIAGNOSIS — R41841 Cognitive communication deficit: Secondary | ICD-10-CM | POA: Diagnosis not present

## 2023-06-28 DIAGNOSIS — R293 Abnormal posture: Secondary | ICD-10-CM | POA: Diagnosis not present

## 2023-06-28 DIAGNOSIS — R279 Unspecified lack of coordination: Secondary | ICD-10-CM | POA: Diagnosis not present

## 2023-06-28 DIAGNOSIS — R1312 Dysphagia, oropharyngeal phase: Secondary | ICD-10-CM | POA: Diagnosis not present

## 2023-06-28 DIAGNOSIS — M4807 Spinal stenosis, lumbosacral region: Secondary | ICD-10-CM | POA: Diagnosis not present

## 2023-06-28 DIAGNOSIS — G459 Transient cerebral ischemic attack, unspecified: Secondary | ICD-10-CM | POA: Diagnosis not present

## 2023-06-29 DIAGNOSIS — R41841 Cognitive communication deficit: Secondary | ICD-10-CM | POA: Diagnosis not present

## 2023-06-29 DIAGNOSIS — M4807 Spinal stenosis, lumbosacral region: Secondary | ICD-10-CM | POA: Diagnosis not present

## 2023-06-29 DIAGNOSIS — R279 Unspecified lack of coordination: Secondary | ICD-10-CM | POA: Diagnosis not present

## 2023-06-29 DIAGNOSIS — R1312 Dysphagia, oropharyngeal phase: Secondary | ICD-10-CM | POA: Diagnosis not present

## 2023-06-29 DIAGNOSIS — R293 Abnormal posture: Secondary | ICD-10-CM | POA: Diagnosis not present

## 2023-06-29 DIAGNOSIS — G459 Transient cerebral ischemic attack, unspecified: Secondary | ICD-10-CM | POA: Diagnosis not present

## 2023-07-11 DIAGNOSIS — R279 Unspecified lack of coordination: Secondary | ICD-10-CM | POA: Diagnosis not present

## 2023-07-11 DIAGNOSIS — G459 Transient cerebral ischemic attack, unspecified: Secondary | ICD-10-CM | POA: Diagnosis not present

## 2023-07-11 DIAGNOSIS — R293 Abnormal posture: Secondary | ICD-10-CM | POA: Diagnosis not present

## 2023-07-11 DIAGNOSIS — R1312 Dysphagia, oropharyngeal phase: Secondary | ICD-10-CM | POA: Diagnosis not present

## 2023-07-11 DIAGNOSIS — M4807 Spinal stenosis, lumbosacral region: Secondary | ICD-10-CM | POA: Diagnosis not present

## 2023-07-11 DIAGNOSIS — R41841 Cognitive communication deficit: Secondary | ICD-10-CM | POA: Diagnosis not present

## 2023-07-14 DIAGNOSIS — R1312 Dysphagia, oropharyngeal phase: Secondary | ICD-10-CM | POA: Diagnosis not present

## 2023-07-14 DIAGNOSIS — M4807 Spinal stenosis, lumbosacral region: Secondary | ICD-10-CM | POA: Diagnosis not present

## 2023-07-14 DIAGNOSIS — R279 Unspecified lack of coordination: Secondary | ICD-10-CM | POA: Diagnosis not present

## 2023-07-14 DIAGNOSIS — R41841 Cognitive communication deficit: Secondary | ICD-10-CM | POA: Diagnosis not present

## 2023-07-14 DIAGNOSIS — G459 Transient cerebral ischemic attack, unspecified: Secondary | ICD-10-CM | POA: Diagnosis not present

## 2023-07-14 DIAGNOSIS — R293 Abnormal posture: Secondary | ICD-10-CM | POA: Diagnosis not present

## 2023-07-19 DIAGNOSIS — R41841 Cognitive communication deficit: Secondary | ICD-10-CM | POA: Diagnosis not present

## 2023-07-19 DIAGNOSIS — R293 Abnormal posture: Secondary | ICD-10-CM | POA: Diagnosis not present

## 2023-07-19 DIAGNOSIS — R279 Unspecified lack of coordination: Secondary | ICD-10-CM | POA: Diagnosis not present

## 2023-07-19 DIAGNOSIS — R1312 Dysphagia, oropharyngeal phase: Secondary | ICD-10-CM | POA: Diagnosis not present

## 2023-07-19 DIAGNOSIS — M4807 Spinal stenosis, lumbosacral region: Secondary | ICD-10-CM | POA: Diagnosis not present

## 2023-07-19 DIAGNOSIS — G459 Transient cerebral ischemic attack, unspecified: Secondary | ICD-10-CM | POA: Diagnosis not present

## 2023-07-20 DIAGNOSIS — G459 Transient cerebral ischemic attack, unspecified: Secondary | ICD-10-CM | POA: Diagnosis not present

## 2023-07-20 DIAGNOSIS — R41841 Cognitive communication deficit: Secondary | ICD-10-CM | POA: Diagnosis not present

## 2023-07-20 DIAGNOSIS — R1312 Dysphagia, oropharyngeal phase: Secondary | ICD-10-CM | POA: Diagnosis not present

## 2023-07-20 DIAGNOSIS — M4807 Spinal stenosis, lumbosacral region: Secondary | ICD-10-CM | POA: Diagnosis not present

## 2023-07-20 DIAGNOSIS — R279 Unspecified lack of coordination: Secondary | ICD-10-CM | POA: Diagnosis not present

## 2023-07-20 DIAGNOSIS — R293 Abnormal posture: Secondary | ICD-10-CM | POA: Diagnosis not present

## 2023-07-24 DIAGNOSIS — R293 Abnormal posture: Secondary | ICD-10-CM | POA: Diagnosis not present

## 2023-07-24 DIAGNOSIS — R41841 Cognitive communication deficit: Secondary | ICD-10-CM | POA: Diagnosis not present

## 2023-07-24 DIAGNOSIS — I69828 Other speech and language deficits following other cerebrovascular disease: Secondary | ICD-10-CM | POA: Diagnosis not present

## 2023-07-24 DIAGNOSIS — R279 Unspecified lack of coordination: Secondary | ICD-10-CM | POA: Diagnosis not present

## 2023-07-24 DIAGNOSIS — R1312 Dysphagia, oropharyngeal phase: Secondary | ICD-10-CM | POA: Diagnosis not present

## 2023-07-25 DIAGNOSIS — R41841 Cognitive communication deficit: Secondary | ICD-10-CM | POA: Diagnosis not present

## 2023-07-25 DIAGNOSIS — R1312 Dysphagia, oropharyngeal phase: Secondary | ICD-10-CM | POA: Diagnosis not present

## 2023-07-25 DIAGNOSIS — R279 Unspecified lack of coordination: Secondary | ICD-10-CM | POA: Diagnosis not present

## 2023-07-25 DIAGNOSIS — I69828 Other speech and language deficits following other cerebrovascular disease: Secondary | ICD-10-CM | POA: Diagnosis not present

## 2023-07-25 DIAGNOSIS — R293 Abnormal posture: Secondary | ICD-10-CM | POA: Diagnosis not present

## 2023-07-26 DIAGNOSIS — R1312 Dysphagia, oropharyngeal phase: Secondary | ICD-10-CM | POA: Diagnosis not present

## 2023-07-26 DIAGNOSIS — I69828 Other speech and language deficits following other cerebrovascular disease: Secondary | ICD-10-CM | POA: Diagnosis not present

## 2023-07-26 DIAGNOSIS — R293 Abnormal posture: Secondary | ICD-10-CM | POA: Diagnosis not present

## 2023-07-26 DIAGNOSIS — R41841 Cognitive communication deficit: Secondary | ICD-10-CM | POA: Diagnosis not present

## 2023-07-26 DIAGNOSIS — R279 Unspecified lack of coordination: Secondary | ICD-10-CM | POA: Diagnosis not present

## 2023-07-27 DIAGNOSIS — R1312 Dysphagia, oropharyngeal phase: Secondary | ICD-10-CM | POA: Diagnosis not present

## 2023-07-27 DIAGNOSIS — R293 Abnormal posture: Secondary | ICD-10-CM | POA: Diagnosis not present

## 2023-07-27 DIAGNOSIS — I69828 Other speech and language deficits following other cerebrovascular disease: Secondary | ICD-10-CM | POA: Diagnosis not present

## 2023-07-27 DIAGNOSIS — R41841 Cognitive communication deficit: Secondary | ICD-10-CM | POA: Diagnosis not present

## 2023-07-27 DIAGNOSIS — R279 Unspecified lack of coordination: Secondary | ICD-10-CM | POA: Diagnosis not present

## 2023-07-28 DIAGNOSIS — R293 Abnormal posture: Secondary | ICD-10-CM | POA: Diagnosis not present

## 2023-07-28 DIAGNOSIS — R279 Unspecified lack of coordination: Secondary | ICD-10-CM | POA: Diagnosis not present

## 2023-07-28 DIAGNOSIS — R1312 Dysphagia, oropharyngeal phase: Secondary | ICD-10-CM | POA: Diagnosis not present

## 2023-07-28 DIAGNOSIS — I69828 Other speech and language deficits following other cerebrovascular disease: Secondary | ICD-10-CM | POA: Diagnosis not present

## 2023-07-28 DIAGNOSIS — R41841 Cognitive communication deficit: Secondary | ICD-10-CM | POA: Diagnosis not present

## 2023-08-01 DIAGNOSIS — I69828 Other speech and language deficits following other cerebrovascular disease: Secondary | ICD-10-CM | POA: Diagnosis not present

## 2023-08-01 DIAGNOSIS — R293 Abnormal posture: Secondary | ICD-10-CM | POA: Diagnosis not present

## 2023-08-01 DIAGNOSIS — R1312 Dysphagia, oropharyngeal phase: Secondary | ICD-10-CM | POA: Diagnosis not present

## 2023-08-01 DIAGNOSIS — R279 Unspecified lack of coordination: Secondary | ICD-10-CM | POA: Diagnosis not present

## 2023-08-01 DIAGNOSIS — R41841 Cognitive communication deficit: Secondary | ICD-10-CM | POA: Diagnosis not present

## 2023-08-02 DIAGNOSIS — R41841 Cognitive communication deficit: Secondary | ICD-10-CM | POA: Diagnosis not present

## 2023-08-02 DIAGNOSIS — I69828 Other speech and language deficits following other cerebrovascular disease: Secondary | ICD-10-CM | POA: Diagnosis not present

## 2023-08-02 DIAGNOSIS — R279 Unspecified lack of coordination: Secondary | ICD-10-CM | POA: Diagnosis not present

## 2023-08-02 DIAGNOSIS — R1312 Dysphagia, oropharyngeal phase: Secondary | ICD-10-CM | POA: Diagnosis not present

## 2023-08-02 DIAGNOSIS — R293 Abnormal posture: Secondary | ICD-10-CM | POA: Diagnosis not present

## 2023-08-03 DIAGNOSIS — R279 Unspecified lack of coordination: Secondary | ICD-10-CM | POA: Diagnosis not present

## 2023-08-03 DIAGNOSIS — R1312 Dysphagia, oropharyngeal phase: Secondary | ICD-10-CM | POA: Diagnosis not present

## 2023-08-03 DIAGNOSIS — I69828 Other speech and language deficits following other cerebrovascular disease: Secondary | ICD-10-CM | POA: Diagnosis not present

## 2023-08-03 DIAGNOSIS — R293 Abnormal posture: Secondary | ICD-10-CM | POA: Diagnosis not present

## 2023-08-03 DIAGNOSIS — R41841 Cognitive communication deficit: Secondary | ICD-10-CM | POA: Diagnosis not present

## 2023-08-04 DIAGNOSIS — R293 Abnormal posture: Secondary | ICD-10-CM | POA: Diagnosis not present

## 2023-08-04 DIAGNOSIS — I69828 Other speech and language deficits following other cerebrovascular disease: Secondary | ICD-10-CM | POA: Diagnosis not present

## 2023-08-04 DIAGNOSIS — R1312 Dysphagia, oropharyngeal phase: Secondary | ICD-10-CM | POA: Diagnosis not present

## 2023-08-04 DIAGNOSIS — R279 Unspecified lack of coordination: Secondary | ICD-10-CM | POA: Diagnosis not present

## 2023-08-04 DIAGNOSIS — R41841 Cognitive communication deficit: Secondary | ICD-10-CM | POA: Diagnosis not present

## 2023-08-05 DIAGNOSIS — I69828 Other speech and language deficits following other cerebrovascular disease: Secondary | ICD-10-CM | POA: Diagnosis not present

## 2023-08-05 DIAGNOSIS — R293 Abnormal posture: Secondary | ICD-10-CM | POA: Diagnosis not present

## 2023-08-05 DIAGNOSIS — R41841 Cognitive communication deficit: Secondary | ICD-10-CM | POA: Diagnosis not present

## 2023-08-05 DIAGNOSIS — R279 Unspecified lack of coordination: Secondary | ICD-10-CM | POA: Diagnosis not present

## 2023-08-05 DIAGNOSIS — R1312 Dysphagia, oropharyngeal phase: Secondary | ICD-10-CM | POA: Diagnosis not present

## 2023-08-08 DIAGNOSIS — R1312 Dysphagia, oropharyngeal phase: Secondary | ICD-10-CM | POA: Diagnosis not present

## 2023-08-08 DIAGNOSIS — I69828 Other speech and language deficits following other cerebrovascular disease: Secondary | ICD-10-CM | POA: Diagnosis not present

## 2023-08-08 DIAGNOSIS — R41841 Cognitive communication deficit: Secondary | ICD-10-CM | POA: Diagnosis not present

## 2023-08-08 DIAGNOSIS — R293 Abnormal posture: Secondary | ICD-10-CM | POA: Diagnosis not present

## 2023-08-08 DIAGNOSIS — R279 Unspecified lack of coordination: Secondary | ICD-10-CM | POA: Diagnosis not present

## 2023-08-09 DIAGNOSIS — I69828 Other speech and language deficits following other cerebrovascular disease: Secondary | ICD-10-CM | POA: Diagnosis not present

## 2023-08-09 DIAGNOSIS — R1312 Dysphagia, oropharyngeal phase: Secondary | ICD-10-CM | POA: Diagnosis not present

## 2023-08-09 DIAGNOSIS — R293 Abnormal posture: Secondary | ICD-10-CM | POA: Diagnosis not present

## 2023-08-09 DIAGNOSIS — R279 Unspecified lack of coordination: Secondary | ICD-10-CM | POA: Diagnosis not present

## 2023-08-09 DIAGNOSIS — R41841 Cognitive communication deficit: Secondary | ICD-10-CM | POA: Diagnosis not present

## 2023-08-10 DIAGNOSIS — R41841 Cognitive communication deficit: Secondary | ICD-10-CM | POA: Diagnosis not present

## 2023-08-10 DIAGNOSIS — R1312 Dysphagia, oropharyngeal phase: Secondary | ICD-10-CM | POA: Diagnosis not present

## 2023-08-10 DIAGNOSIS — I69828 Other speech and language deficits following other cerebrovascular disease: Secondary | ICD-10-CM | POA: Diagnosis not present

## 2023-08-10 DIAGNOSIS — R293 Abnormal posture: Secondary | ICD-10-CM | POA: Diagnosis not present

## 2023-08-10 DIAGNOSIS — R279 Unspecified lack of coordination: Secondary | ICD-10-CM | POA: Diagnosis not present

## 2023-08-11 DIAGNOSIS — R279 Unspecified lack of coordination: Secondary | ICD-10-CM | POA: Diagnosis not present

## 2023-08-11 DIAGNOSIS — I69828 Other speech and language deficits following other cerebrovascular disease: Secondary | ICD-10-CM | POA: Diagnosis not present

## 2023-08-11 DIAGNOSIS — R1312 Dysphagia, oropharyngeal phase: Secondary | ICD-10-CM | POA: Diagnosis not present

## 2023-08-11 DIAGNOSIS — R41841 Cognitive communication deficit: Secondary | ICD-10-CM | POA: Diagnosis not present

## 2023-08-11 DIAGNOSIS — R293 Abnormal posture: Secondary | ICD-10-CM | POA: Diagnosis not present

## 2023-08-15 DIAGNOSIS — I69828 Other speech and language deficits following other cerebrovascular disease: Secondary | ICD-10-CM | POA: Diagnosis not present

## 2023-08-15 DIAGNOSIS — R293 Abnormal posture: Secondary | ICD-10-CM | POA: Diagnosis not present

## 2023-08-15 DIAGNOSIS — R279 Unspecified lack of coordination: Secondary | ICD-10-CM | POA: Diagnosis not present

## 2023-08-15 DIAGNOSIS — R41841 Cognitive communication deficit: Secondary | ICD-10-CM | POA: Diagnosis not present

## 2023-08-15 DIAGNOSIS — R1312 Dysphagia, oropharyngeal phase: Secondary | ICD-10-CM | POA: Diagnosis not present

## 2023-08-16 DIAGNOSIS — I69828 Other speech and language deficits following other cerebrovascular disease: Secondary | ICD-10-CM | POA: Diagnosis not present

## 2023-08-16 DIAGNOSIS — R293 Abnormal posture: Secondary | ICD-10-CM | POA: Diagnosis not present

## 2023-08-16 DIAGNOSIS — R1312 Dysphagia, oropharyngeal phase: Secondary | ICD-10-CM | POA: Diagnosis not present

## 2023-08-16 DIAGNOSIS — R41841 Cognitive communication deficit: Secondary | ICD-10-CM | POA: Diagnosis not present

## 2023-08-16 DIAGNOSIS — R279 Unspecified lack of coordination: Secondary | ICD-10-CM | POA: Diagnosis not present

## 2023-08-17 DIAGNOSIS — R279 Unspecified lack of coordination: Secondary | ICD-10-CM | POA: Diagnosis not present

## 2023-08-17 DIAGNOSIS — R1312 Dysphagia, oropharyngeal phase: Secondary | ICD-10-CM | POA: Diagnosis not present

## 2023-08-17 DIAGNOSIS — I69828 Other speech and language deficits following other cerebrovascular disease: Secondary | ICD-10-CM | POA: Diagnosis not present

## 2023-08-17 DIAGNOSIS — R41841 Cognitive communication deficit: Secondary | ICD-10-CM | POA: Diagnosis not present

## 2023-08-17 DIAGNOSIS — R293 Abnormal posture: Secondary | ICD-10-CM | POA: Diagnosis not present

## 2023-08-18 DIAGNOSIS — R1312 Dysphagia, oropharyngeal phase: Secondary | ICD-10-CM | POA: Diagnosis not present

## 2023-08-18 DIAGNOSIS — R279 Unspecified lack of coordination: Secondary | ICD-10-CM | POA: Diagnosis not present

## 2023-08-18 DIAGNOSIS — R293 Abnormal posture: Secondary | ICD-10-CM | POA: Diagnosis not present

## 2023-08-18 DIAGNOSIS — I69828 Other speech and language deficits following other cerebrovascular disease: Secondary | ICD-10-CM | POA: Diagnosis not present

## 2023-08-18 DIAGNOSIS — R41841 Cognitive communication deficit: Secondary | ICD-10-CM | POA: Diagnosis not present

## 2023-08-19 DIAGNOSIS — I69828 Other speech and language deficits following other cerebrovascular disease: Secondary | ICD-10-CM | POA: Diagnosis not present

## 2023-08-19 DIAGNOSIS — R293 Abnormal posture: Secondary | ICD-10-CM | POA: Diagnosis not present

## 2023-08-19 DIAGNOSIS — R1312 Dysphagia, oropharyngeal phase: Secondary | ICD-10-CM | POA: Diagnosis not present

## 2023-08-19 DIAGNOSIS — R41841 Cognitive communication deficit: Secondary | ICD-10-CM | POA: Diagnosis not present

## 2023-08-19 DIAGNOSIS — R279 Unspecified lack of coordination: Secondary | ICD-10-CM | POA: Diagnosis not present

## 2023-08-20 DIAGNOSIS — R279 Unspecified lack of coordination: Secondary | ICD-10-CM | POA: Diagnosis not present

## 2023-08-20 DIAGNOSIS — R1312 Dysphagia, oropharyngeal phase: Secondary | ICD-10-CM | POA: Diagnosis not present

## 2023-08-20 DIAGNOSIS — I69828 Other speech and language deficits following other cerebrovascular disease: Secondary | ICD-10-CM | POA: Diagnosis not present

## 2023-08-20 DIAGNOSIS — R41841 Cognitive communication deficit: Secondary | ICD-10-CM | POA: Diagnosis not present

## 2023-08-20 DIAGNOSIS — R293 Abnormal posture: Secondary | ICD-10-CM | POA: Diagnosis not present
